# Patient Record
Sex: Male | Born: 1950
Health system: Southern US, Community
[De-identification: ages and names within clinical notes are randomized; demographics above are authoritative.]

## PROBLEM LIST (undated history)

## (undated) DIAGNOSIS — E119 Type 2 diabetes mellitus without complications: Secondary | ICD-10-CM

## (undated) DIAGNOSIS — E785 Hyperlipidemia, unspecified: Principal | ICD-10-CM

## (undated) DIAGNOSIS — I10 Essential (primary) hypertension: Secondary | ICD-10-CM

## (undated) HISTORY — DX: Hyperlipidemia, unspecified: E78.5

## (undated) HISTORY — DX: Essential (primary) hypertension: I10

## (undated) HISTORY — DX: Type 2 diabetes mellitus without complications: E11.9

## (undated) HISTORY — PX: TONSILLECTOMY: SUR1361

## (undated) HISTORY — PX: COLONOSCOPY: SHX174

---

## 1973-11-25 HISTORY — PX: INNER EAR SURGERY: SHX679

## 2004-06-01 ENCOUNTER — Emergency Department (HOSPITAL_COMMUNITY): Admission: EM | Admit: 2004-06-01 | Discharge: 2004-06-01 | Payer: Self-pay | Admitting: Emergency Medicine

## 2008-11-25 LAB — HM COLONOSCOPY: HM Colonoscopy: NORMAL

## 2010-07-17 ENCOUNTER — Ambulatory Visit: Payer: Self-pay | Admitting: Family Medicine

## 2010-07-17 DIAGNOSIS — I10 Essential (primary) hypertension: Secondary | ICD-10-CM

## 2010-07-17 HISTORY — DX: Essential (primary) hypertension: I10

## 2010-08-02 ENCOUNTER — Ambulatory Visit: Payer: Self-pay | Admitting: Family Medicine

## 2010-08-02 LAB — CONVERTED CEMR LAB
Alkaline Phosphatase: 46 units/L (ref 39–117)
BUN: 14 mg/dL (ref 6–23)
Basophils Absolute: 0 10*3/uL (ref 0.0–0.1)
Basophils Relative: 1 % (ref 0.0–3.0)
Bilirubin Urine: NEGATIVE
Bilirubin, Direct: 0.1 mg/dL (ref 0.0–0.3)
CO2: 29 meq/L (ref 19–32)
Calcium: 9.2 mg/dL (ref 8.4–10.5)
Cholesterol: 240 mg/dL — ABNORMAL HIGH (ref 0–200)
Creatinine, Ser: 1.2 mg/dL (ref 0.4–1.2)
Direct LDL: 182 mg/dL
Eosinophils Absolute: 0.1 10*3/uL (ref 0.0–0.7)
Ketones, urine, test strip: NEGATIVE
Lymphocytes Relative: 52.6 % — ABNORMAL HIGH (ref 12.0–46.0)
MCHC: 34.3 g/dL (ref 30.0–36.0)
Monocytes Absolute: 0.3 10*3/uL (ref 0.1–1.0)
Neutrophils Relative %: 36 % — ABNORMAL LOW (ref 43.0–77.0)
PSA: 0.97 ng/mL (ref 0.10–4.00)
Platelets: 211 10*3/uL (ref 150.0–400.0)
Protein, U semiquant: NEGATIVE
RBC: 4.39 M/uL (ref 3.87–5.11)
Total Bilirubin: 0.7 mg/dL (ref 0.3–1.2)
Total CHOL/HDL Ratio: 6
Total Protein: 6.8 g/dL (ref 6.0–8.3)
Triglycerides: 140 mg/dL (ref 0.0–149.0)
Urobilinogen, UA: 0.2
VLDL: 28 mg/dL (ref 0.0–40.0)
pH: 7

## 2010-08-07 ENCOUNTER — Ambulatory Visit: Payer: Self-pay | Admitting: Family Medicine

## 2010-10-01 ENCOUNTER — Encounter: Payer: Self-pay | Admitting: Family Medicine

## 2010-10-03 ENCOUNTER — Telehealth: Payer: Self-pay | Admitting: Family Medicine

## 2010-12-25 NOTE — Assessment & Plan Note (Signed)
Summary: cpx/njr   Vital Signs:  Patient profile:   60 year old male Height:      68.5 inches Weight:      205 pounds Temp:     98.2 degrees F oral Pulse rate:   72 / minute Pulse rhythm:   regular Resp:     12 per minute BP sitting:   142 / 90  (left arm) Cuff size:   large  Vitals Entered By: Sid Falcon LPN (August 07, 2010 10:20 AM) CC: CPX, Lipid Management   History of Present Illness: Here for CPE.  PMH, SH, AND FH are reviewed.  Pt had colonoscopy last year -reportedly normal. FH signif only for hypertension.  No premature CAD. Pt exercises with Tennis few times per week.    Diabetes Management History:      She says that she is not exercising regularly.    Lipid Management History:      Positive NCEP/ATP III risk factors include male age 59 years old or older and hypertension.  Negative NCEP/ATP III risk factors include non-diabetic, no family history for ischemic heart disease, non-tobacco-user status, no ASHD (atherosclerotic heart disease), no prior stroke/TIA, no peripheral vascular disease, and no history of aortic aneurysm.      Clinical Review Panels:  Prevention   Last Colonoscopy:  normal (11/25/2008)   Last PSA:  0.97 (08/02/2010)  Immunizations   Last Tetanus Booster:  Historical (11/26/2003)   Last Flu Vaccine:  Fluvax 3+ (08/07/2010)  Lipid Management   Cholesterol:  240 (08/02/2010)   HDL (good cholesterol):  41.40 (08/02/2010)  Diabetes Management   Creatinine:  1.2 (08/02/2010)   Last Flu Vaccine:  Fluvax 3+ (08/07/2010)  CBC   WBC:  3.8 (08/02/2010)   RBC:  4.39 (08/02/2010)   Hgb:  13.9 (08/02/2010)   Hct:  40.5 (08/02/2010)   Platelets:  211.0 (08/02/2010)   MCV  92.1 (08/02/2010)   MCHC  34.3 (08/02/2010)   RDW  13.4 (08/02/2010)   PMN:  36.0 (08/02/2010)   Lymphs:  52.6 (08/02/2010)   Monos:  8.1 (08/02/2010)   Eosinophils:  2.3 (08/02/2010)   Basophil:  1.0 (08/02/2010)  Complete Metabolic Panel   Glucose:   106 (08/02/2010)   Sodium:  136 (08/02/2010)   Potassium:  4.1 (08/02/2010)   Chloride:  99 (08/02/2010)   CO2:  29 (08/02/2010)   BUN:  14 (08/02/2010)   Creatinine:  1.2 (08/02/2010)   Albumin:  4.0 (08/02/2010)   Total Protein:  6.8 (08/02/2010)   Calcium:  9.2 (08/02/2010)   Total Bili:  0.7 (08/02/2010)   Alk Phos:  46 (08/02/2010)   SGPT (ALT):  53 (08/02/2010)   SGOT (AST):  25 (08/02/2010)   Allergies (verified): No Known Drug Allergies  Past History:  Past Medical History: Last updated: 07/17/2010 Hyperlipidemia Hypertension  Past Surgical History: Last updated: 07/17/2010 left ear surgery 1975  Family History: Last updated: 07/17/2010 Family History Hypertension  Social History: Last updated: 07/17/2010 Occupation:  Charity fundraiser Married Never Smoked Alcohol use-no Regular exercise-no  Risk Factors: Exercise: no (07/17/2010)  Risk Factors: Smoking Status: never (07/17/2010) PMH-FH-SH reviewed for relevance  Review of Systems  The patient denies anorexia, fever, weight loss, vision loss, decreased hearing, hoarseness, chest pain, syncope, dyspnea on exertion, peripheral edema, prolonged cough, headaches, hemoptysis, abdominal pain, melena, hematochezia, severe indigestion/heartburn, hematuria, incontinence, genital sores, muscle weakness, suspicious skin lesions, transient blindness, difficulty walking, depression, unusual weight change, abnormal bleeding, enlarged lymph nodes, and testicular masses.  Physical Exam  General:  Well-developed,well-nourished,in no acute distress; alert,appropriate and cooperative throughout examination Head:  Normocephalic and atraumatic without obvious abnormalities. No apparent alopecia or balding. Eyes:  No corneal or conjunctival inflammation noted. EOMI. Perrla. Funduscopic exam benign, without hemorrhages, exudates or papilledema. Vision grossly normal. Ears:  External ear exam shows no significant lesions or  deformities.  Otoscopic examination reveals clear canals, tympanic membranes are intact bilaterally without bulging, retraction, inflammation or discharge. Hearing is grossly normal bilaterally. Mouth:  Oral mucosa and oropharynx without lesions or exudates.  Teeth in good repair. Neck:  No deformities, masses, or tenderness noted. Lungs:  Normal respiratory effort, chest expands symmetrically. Lungs are clear to auscultation, no crackles or wheezes. Heart:  Normal rate and regular rhythm. S1 and S2 normal without gallop, murmur, click, rub or other extra sounds. Abdomen:  Bowel sounds positive,abdomen soft and non-tender without masses, organomegaly or hernias noted. Msk:  large lipoma upper back Extremities:  No clubbing, cyanosis, edema, or deformity noted with normal full range of motion of all joints.   Neurologic:  alert & oriented X3, cranial nerves II-XII intact, strength normal in all extremities, and gait normal.   Skin:  no rashes and no suspicious lesions.   Cervical Nodes:  No lymphadenopathy noted Psych:  normally interactive, good eye contact, not anxious appearing, and not depressed appearing.     Impression & Recommendations:  Problem # 1:  Preventive Health Care (ICD-V70.0) labs reviewed with pt. Flu vaccine given. REcommend work on weight loss.  Problem # 2:  HYPERTENSION (ICD-401.9) Marginal control.  Discussed tighter control.  He prefers to work on weight loss and reassess in few months. His updated medication list for this problem includes:    Benicar Hct 40-12.5 Mg Tabs (Olmesartan medoxomil-hctz) ..... Once daily  Problem # 3:  HYPERLIPIDEMIA (ICD-272.4) discussed options.  Educ given on lowering saturated fat.  Will plan to repeat in 6 months.  Complete Medication List: 1)  Benicar Hct 40-12.5 Mg Tabs (Olmesartan medoxomil-hctz) .... Once daily  Other Orders: Admin 1st Vaccine (04540) Flu Vaccine 84yrs + [phone])  Diabetes Management Assessment/Plan:       The following lipid goals have been established for the patient: Total cholesterol goal of 200; LDL cholesterol goal of 130; HDL cholesterol goal of 40; Triglyceride goal of 150.    Lipid Assessment/Plan:      Based on NCEP/ATP III, the patient's risk factor category is "0-1 risk factors".  The patient's lipid goals are as follows: Total cholesterol goal is 200; LDL cholesterol goal is 130; HDL cholesterol goal is 40; Triglyceride goal is 150.    Patient Instructions: 1)  It is important that you exercise reguarly at least 20 minutes 5 times a week. If you develop chest pain, have severe difficulty breathing, or feel very tired, stop exercising immediately and seek medical attention.  2)  You need to lose weight. Consider a lower calorie diet and regular exercise.  3)  Check your  Blood Pressure regularly . If it is above:140/90   you should make an appointment. 4)  Please schedule a follow-up appointment in 6 months .  5)  BMP prior to visit, ICD-9: 401.9 6)  Lipid panel prior to visit ICD-9 : 272.4  Flu Vaccine Consent Questions     Do you have a history of severe allergic reactions to this vaccine? no    Any prior history of allergic reactions to egg and/or gelatin? no    Do you have a  sensitivity to the preservative Thimersol? no    Do you have a past history of Guillan-Barre Syndrome? no    Do you currently have an acute febrile illness? no    Have you ever had a severe reaction to latex? no    Vaccine information given and explained to patient? yes    Are you currently pregnant? no    Lot Number:[id number]   Exp Date:05/25/2011   Site Given  Left Deltoid IM        .lbflu   Prevention & Chronic Care Immunizations   Influenza vaccine: Fluvax 3+  (08/07/2010)    Tetanus booster: 11/26/2003: Historical    Pneumococcal vaccine: Not documented  Colorectal Screening   Hemoccult: Not documented    Colonoscopy: normal  (11/25/2008)  Other Screening   PSA: 0.97   (08/02/2010)   Smoking status: never  (07/17/2010)  Lipids   Total Cholesterol: 240  (08/02/2010)   LDL: Not documented   LDL Direct: 182.0  (08/02/2010)   HDL: 41.40  (08/02/2010)   Triglycerides: 140.0  (08/02/2010)    SGOT (AST): 25  (08/02/2010)   SGPT (ALT): 53  (08/02/2010)   Alkaline phosphatase: 46  (08/02/2010)   Total bilirubin: 0.7  (08/02/2010)  Hypertension   Last Blood Pressure: 142 / 90  (08/07/2010)   Serum creatinine: 1.2  (08/02/2010)   Serum potassium 4.1  (08/02/2010)  Self-Management Support :    Hypertension self-management support: Not documented    Lipid self-management support: Not documented

## 2010-12-25 NOTE — Progress Notes (Signed)
  Phone Note Call from Patient   Summary of Call: Home BP readings submitted and improving with weight loss and diet change.  Mostly 130s systolic and 70s diastolic. He is requesting Cialis for ED.  No nitroglycerin use.  will send in script. Initial call taken by: Evelena Peat MD,  October 03, 2010 9:06 AM    New/Updated Medications: CIALIS 20 MG TABS (TADALAFIL) one by mouth every other day as needed Prescriptions: CIALIS 20 MG TABS (TADALAFIL) one by mouth every other day as needed  #6 x 6   Entered and Authorized by:   Evelena Peat MD   Signed by:   Evelena Peat MD on 10/03/2010   Method used:   Electronically to        [facility] [address] (retail)       [phone]       [place], [place]  [address]       Ph: [phone]       Fax: [phone]   RxID:   [id number]

## 2010-12-25 NOTE — Assessment & Plan Note (Signed)
Summary: to be est/pt will come in fasting/njr   Vital Signs:  Patient profile:   60 year old male Height:      68 inches Weight:      203 pounds BMI:     30.98 Temp:     98.0 degrees F oral Pulse rate:   72 / minute Pulse rhythm:   regular Resp:     12 per minute BP sitting:   140 / 100  (left arm) Cuff size:   regular  Vitals Entered By: Sid Falcon LPN (July 17, 2010 9:13 AM)  Nutrition Counseling: Patient's BMI is greater than 25 and therefore counseled on weight management options.  Serial Vital Signs/Assessments:  Time      Position  BP       Pulse  Resp  Temp     By                     148/98                         Evelena Peat MD  CC: CPX, pt fasting, Hypertension Management, Lipid Management   History of Present Illness: New patient to establish care.  Patient has hypertension treated with Benicar HCTZ. Also reported history of hyperlipidemia but not on treatment. No history of CAD. Blood pressures have generally been fairly well controlled until recently with some systolics from 140 and diastolics around 90-100. No headaches or dizziness. Denies any palpitations or chest pains. Exercises with tennis about 2-3 times per week.  Family history significant for mother with hypertension otherwise unrevealing.  Social history he is married has one son who just started college. Nonsmoker. No alcohol use. Works as a Charity fundraiser with [employer]  Hypertension History:      She denies headache, chest pain, palpitations, dyspnea with exertion, orthopnea, peripheral edema, visual symptoms, syncope, and side effects from treatment.  She notes no problems with any antihypertensive medication side effects.        Positive major cardiovascular risk factors include male age 71 years old or older, hyperlipidemia, and hypertension.  Negative major cardiovascular risk factors include no history of diabetes and non-tobacco-user status.        Further assessment for target  organ damage reveals no history of ASHD, stroke/TIA, or peripheral vascular disease.    Lipid Management History:      Positive NCEP/ATP III risk factors include male age 15 years old or older and hypertension.  Negative NCEP/ATP III risk factors include non-diabetic, non-tobacco-user status, no ASHD (atherosclerotic heart disease), no prior stroke/TIA, no peripheral vascular disease, and no history of aortic aneurysm.      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  The patient denies fever, weight loss, decreased hearing, hoarseness, chest pain, syncope, dyspnea on exertion, peripheral edema, prolonged cough, headaches, hemoptysis, abdominal pain, melena, hematochezia, and severe indigestion/heartburn.    Physical Exam  General:  Well-developed,well-nourished,in no acute distress; alert,appropriate and cooperative throughout examination Eyes:  No corneal or conjunctival inflammation noted. EOMI. Perrla. Funduscopic exam benign, without hemorrhages, exudates or papilledema. Vision grossly normal. Ears:  External ear  exam shows no significant lesions or deformities.  Otoscopic examination reveals clear canals, tympanic membranes are intact bilaterally without bulging, retraction, inflammation or discharge. Hearing is grossly normal bilaterally. Mouth:  Oral mucosa and oropharynx without lesions or exudates.  Teeth in good repair. Neck:  No deformities, masses, or tenderness noted. Lungs:  Normal respiratory effort, chest expands symmetrically. Lungs are clear to auscultation, no crackles or wheezes. Heart:  normal rate and regular rhythm.     Impression & Recommendations:  Problem # 1:  HYPERTENSION (ICD-401.9) Assessment Deteriorated discussed options. Weight loss and schedule complete physical and reassess 2 months. If still up at that time consider addition of another medication Her updated medication list for this problem includes:    Benicar Hct 40-12.5 Mg Tabs (Olmesartan medoxomil-hctz) ..... Once daily  Problem # 2:  HYPERLIPIDEMIA (ICD-272.4)  Complete Medication List: 1)  Benicar Hct 40-12.5 Mg Tabs (Olmesartan medoxomil-hctz) .... Once daily  Hypertension Assessment/Plan:      The patient's hypertensive risk group is category B: At least one risk factor (excluding diabetes) with no target organ damage.  Today's blood pressure is 140/100.    Lipid Assessment/Plan:      Based on NCEP/ATP III, the patient's risk factor category is "2 or more risk factors and a calculated 10 year CAD risk of > 20%".  The patient's lipid goals are as follows: Total cholesterol goal is 200; LDL cholesterol goal is 130; HDL cholesterol goal is 40; Triglyceride goal is 150.    Patient Instructions: 1)  Schedule complete physical examination  2)  Limit your Sodium(salt) .  3)  It is important that you exercise reguarly at least 20 minutes 5 times a week. If you develop chest pain, have severe difficulty breathing, or feel very tired, stop exercising immediately and seek medical attention.  4)  You need to lose  weight. Consider a lower calorie diet and regular exercise.   Preventive Care Screening  Colonoscopy:    Date:  11/25/2008    Results:  normal   Last Tetanus Booster:    Date:  11/26/2003    Results:  Historical

## 2010-12-25 NOTE — Progress Notes (Signed)
Summary: List of Blood Pressures taken at home  List of Blood Pressures taken at home   Imported By: Maryln Gottron 10/08/2010 13:40:37  _____________________________________________________________________  External Attachment:    Type:   Image     Comment:   External Document

## 2011-01-10 ENCOUNTER — Telehealth: Payer: Self-pay | Admitting: Family Medicine

## 2011-01-14 NOTE — Telephone Encounter (Signed)
error 

## 2011-02-06 ENCOUNTER — Ambulatory Visit: Payer: Self-pay | Admitting: Family Medicine

## 2011-03-19 ENCOUNTER — Ambulatory Visit: Payer: Self-pay | Admitting: Family Medicine

## 2011-03-26 ENCOUNTER — Encounter: Payer: Self-pay | Admitting: Family Medicine

## 2011-03-26 ENCOUNTER — Ambulatory Visit (INDEPENDENT_AMBULATORY_CARE_PROVIDER_SITE_OTHER): Payer: Self-pay | Admitting: Family Medicine

## 2011-03-26 VITALS — BP 140/82 | Temp 98.5°F | Ht 69.0 in | Wt 200.0 lb

## 2011-03-26 DIAGNOSIS — R7303 Prediabetes: Secondary | ICD-10-CM

## 2011-03-26 DIAGNOSIS — E785 Hyperlipidemia, unspecified: Secondary | ICD-10-CM

## 2011-03-26 DIAGNOSIS — R7309 Other abnormal glucose: Secondary | ICD-10-CM

## 2011-03-26 DIAGNOSIS — I10 Essential (primary) hypertension: Secondary | ICD-10-CM

## 2011-03-26 HISTORY — DX: Hyperlipidemia, unspecified: E78.5

## 2011-03-26 LAB — LIPID PANEL
Cholesterol: 238 mg/dL — ABNORMAL HIGH (ref 0–200)
HDL: 45.1 mg/dL (ref >39.00)
Triglycerides: 115 mg/dL (ref 0.0–149.0)

## 2011-03-26 NOTE — Progress Notes (Signed)
  Subjective:    Patient ID: Jon Moses, male    DOB: 04/06/1951, 60 y.o.   MRN: [id number]  HPI Patient seen for followup. Elevated blood pressure and increased lipids at physical. Has made some lifestyle changes. Reduction of sodium intake. Reduction of overall fats and sugars. Lost about 3 pounds. Has not been on exercise secondary to finger injury. Plays tennis for exercise. Compliant with medication except for last night-didn't take BP med.. Blood pressures well controlled by home readings and improved over the past few months. He had history of prediabetes with blood sugar 106 on recent physical. No symptoms of hyperglycemia.   Review of Systems  Constitutional: Negative for fatigue.  Eyes: Negative for visual disturbance.  Respiratory: Negative for cough, chest tightness and shortness of breath.   Cardiovascular: Negative for chest pain, palpitations and leg swelling.  Genitourinary: Negative for dysuria.  Neurological: Negative for dizziness, syncope, weakness, light-headedness and headaches.  Hematological: Negative for adenopathy.       Objective:   Physical Exam  Constitutional: He is oriented to person, place, and time. He appears well-developed and well-nourished.  HENT:  Mouth/Throat: Oropharynx is clear and moist. No oropharyngeal exudate.  Eyes: Pupils are equal, round, and reactive to light.  Neck: Neck supple. No thyromegaly present.  Cardiovascular: Normal rate, regular rhythm and normal heart sounds.   No murmur heard. Pulmonary/Chest: Effort normal and breath sounds normal. No respiratory distress. He has no wheezes. He has no rales.  Musculoskeletal: He exhibits no edema.  Neurological: He is alert and oriented to person, place, and time.  Psychiatric: He has a normal mood and affect.          Assessment & Plan:  #1 hypertension improved continue weight loss efforts. Get back to regular exercise. Continue current medication #2 hyperlipidemia recheck  fasting lipids today.  #3 prediabetes. Recheck fasting blood sugar this morning. Continue weight loss efforts.

## 2011-03-28 NOTE — Progress Notes (Signed)
Quick Note:  Pt informed ______ 

## 2011-04-02 NOTE — Progress Notes (Signed)
Quick Note:  Pt informed ______ 

## 2011-05-31 ENCOUNTER — Other Ambulatory Visit: Payer: Self-pay | Admitting: *Deleted

## 2011-05-31 MED ORDER — TADALAFIL 20 MG PO TABS: 20.0000 mg | ORAL_TABLET | Freq: Every day | ORAL | Status: DC | PRN

## 2011-05-31 NOTE — Telephone Encounter (Signed)
PC, pt requesting refill Cialis

## 2011-09-26 ENCOUNTER — Ambulatory Visit: Payer: Self-pay | Admitting: Family Medicine

## 2011-10-29 ENCOUNTER — Ambulatory Visit (INDEPENDENT_AMBULATORY_CARE_PROVIDER_SITE_OTHER): Payer: BC Managed Care – PPO | Admitting: Family Medicine

## 2011-10-29 ENCOUNTER — Encounter: Payer: Self-pay | Admitting: Family Medicine

## 2011-10-29 VITALS — BP 156/98 | Temp 98.5°F | Wt 196.0 lb

## 2011-10-29 DIAGNOSIS — E785 Hyperlipidemia, unspecified: Secondary | ICD-10-CM

## 2011-10-29 DIAGNOSIS — Z23 Encounter for immunization: Secondary | ICD-10-CM

## 2011-10-29 DIAGNOSIS — I10 Essential (primary) hypertension: Secondary | ICD-10-CM

## 2011-10-29 LAB — LDL CHOLESTEROL, DIRECT: Direct LDL: 168.2 mg/dL

## 2011-10-29 LAB — HEPATIC FUNCTION PANEL
ALT: 26 U/L (ref 0–53)
Bilirubin, Direct: 0 mg/dL (ref 0.0–0.3)
Total Bilirubin: 0.8 mg/dL (ref 0.3–1.2)
Total Protein: 7.5 g/dL (ref 6.0–8.3)

## 2011-10-29 LAB — BASIC METABOLIC PANEL
BUN: 15 mg/dL (ref 6–23)
Chloride: 99 mEq/L (ref 96–112)
GFR: 90.98 mL/min (ref >60.00)
Potassium: 3.8 mEq/L (ref 3.5–5.1)
Sodium: 137 mEq/L (ref 135–145)

## 2011-10-29 LAB — LIPID PANEL
HDL: 52.3 mg/dL (ref >39.00)
Triglycerides: 88 mg/dL (ref 0.0–149.0)
VLDL: 17.6 mg/dL (ref 0.0–40.0)

## 2011-10-29 NOTE — Progress Notes (Signed)
  Subjective:    Patient ID: Jon Moses, male    DOB: 1951/02/09, 60 y.o.   MRN: [id number]  HPI  Medical followup. Hypertension treated with Benicar HCTZ. Compliant with therapy. Not monitoring blood pressures regularly. No headaches or dizziness. Has history of hyperlipidemia and has been trying to lose some weight. 4 additional pounds of weight loss since last visit. He made substantial dietary changes. Prefers to avoid antilipid medications. No history of smoking. No alcohol use. No consistent aerobic exercise.   Review of Systems  Constitutional: Negative for fatigue.  Eyes: Negative for visual disturbance.  Respiratory: Negative for cough, chest tightness and shortness of breath.   Cardiovascular: Negative for chest pain, palpitations and leg swelling.  Neurological: Negative for dizziness, syncope, weakness, light-headedness and headaches.       Objective:   Physical Exam  Constitutional: He is oriented to person, place, and time. He appears well-developed and well-nourished.  Neck: Neck supple. No thyromegaly present.  Cardiovascular: Normal rate, regular rhythm and normal heart sounds.   No murmur heard. Pulmonary/Chest: Effort normal and breath sounds normal. No respiratory distress. He has no wheezes. He has no rales.  Musculoskeletal: He exhibits no edema.  Neurological: He is alert and oriented to person, place, and time.          Assessment & Plan:  #1 hypertension. Poor control by today's reading. Reassess in one month. Bring home monitor and home readings at that time. If still up then additional medication  #2 hyperlipidemia. Check lipid and hepatic panel. Continue weight loss efforts.  #3 health maintenance. Flu vaccine recommended and patient has no contraindications.

## 2011-10-29 NOTE — Patient Instructions (Addendum)
Increase aerobic exercise. Continue with weight loss efforts. Bring home BP monitor at follow up. Goal BP is < 140/90.

## 2011-10-30 NOTE — Progress Notes (Signed)
Quick Note:  Pt informed on cell VM ______ 

## 2011-11-29 ENCOUNTER — Ambulatory Visit (INDEPENDENT_AMBULATORY_CARE_PROVIDER_SITE_OTHER): Payer: BC Managed Care – PPO | Admitting: Family Medicine

## 2011-11-29 ENCOUNTER — Encounter: Payer: Self-pay | Admitting: Family Medicine

## 2011-11-29 VITALS — BP 160/100 | Temp 98.5°F | Wt 196.0 lb

## 2011-11-29 DIAGNOSIS — I10 Essential (primary) hypertension: Secondary | ICD-10-CM

## 2011-11-29 MED ORDER — AMLODIPINE BESYLATE 5 MG PO TABS: 5.0000 mg | ORAL_TABLET | Freq: Every day | ORAL | Status: DC

## 2011-11-29 NOTE — Progress Notes (Signed)
  Subjective:    Patient ID: Jon Moses, male    DOB: [birth year], 61 y.o.   MRN: [id number]  HPI  Patient seen for followup hypertension. By home readings he's had some as high as 160 systolic and lows from 136 systolic. No headaches or dizziness. Takes Benicar HCT 40/12.5 one daily.   No consistent exercise.  Compliant with BP medication.  No chest pain or dyspnea.   Review of Systems  Constitutional: Negative for fever, appetite change, fatigue and unexpected weight change.  Respiratory: Negative for cough and shortness of breath.   Cardiovascular: Negative for chest pain.  Neurological: Negative for dizziness and headaches.       Objective:   Physical Exam  Constitutional: He appears well-developed and well-nourished. No distress.  Eyes: Pupils are equal, round, and reactive to light.  Cardiovascular: Normal rate and regular rhythm.   Pulmonary/Chest: Effort normal and breath sounds normal. No respiratory distress. He has no wheezes. He has no rales.  Musculoskeletal: He exhibits no edema.          Assessment & Plan:  Hypertension suboptimally controlled. Add amlodipine 5 mg daily and continue Benicar HCTZ. Get back established with regular exercise. Routine followup 2 months

## 2012-01-27 ENCOUNTER — Ambulatory Visit: Payer: BC Managed Care – PPO | Admitting: Family Medicine

## 2012-01-27 ENCOUNTER — Telehealth: Payer: Self-pay | Admitting: *Deleted

## 2012-01-27 NOTE — Telephone Encounter (Signed)
Pt was no show for 2 month F/U visit.  VM left for pt to call with explaination

## 2012-02-19 ENCOUNTER — Ambulatory Visit: Payer: BC Managed Care – PPO | Admitting: Family Medicine

## 2012-04-03 ENCOUNTER — Ambulatory Visit: Payer: BC Managed Care – PPO | Admitting: Family Medicine

## 2012-04-03 DIAGNOSIS — Z0289 Encounter for other administrative examinations: Secondary | ICD-10-CM

## 2012-07-15 ENCOUNTER — Other Ambulatory Visit: Payer: Self-pay

## 2012-07-15 NOTE — Telephone Encounter (Signed)
Refill for 6 months. 

## 2012-07-15 NOTE — Telephone Encounter (Signed)
Rx request for cialis 20 mg #10.  Pt last seen /04/13.  Rx last filled 05/31/11  Pls advise.

## 2012-07-16 MED ORDER — TADALAFIL 20 MG PO TABS: 20.0000 mg | ORAL_TABLET | Freq: Every day | ORAL | Status: DC | PRN

## 2012-07-16 NOTE — Telephone Encounter (Signed)
Rx sent to pharmacy   

## 2012-09-09 ENCOUNTER — Other Ambulatory Visit (INDEPENDENT_AMBULATORY_CARE_PROVIDER_SITE_OTHER): Payer: BC Managed Care – PPO

## 2012-09-09 DIAGNOSIS — Z0000 Encounter for general adult medical examination without abnormal findings: Secondary | ICD-10-CM

## 2012-09-09 LAB — BASIC METABOLIC PANEL
CO2: 32 mEq/L (ref 19–32)
Chloride: 98 mEq/L (ref 96–112)
Creatinine, Ser: 1.2 mg/dL (ref 0.4–1.5)
Potassium: 4.6 mEq/L (ref 3.5–5.1)

## 2012-09-09 LAB — HEPATIC FUNCTION PANEL
ALT: 35 U/L (ref 0–53)
Albumin: 3.9 g/dL (ref 3.5–5.2)
Bilirubin, Direct: 0.1 mg/dL (ref 0.0–0.3)
Total Protein: 7.1 g/dL (ref 6.0–8.3)

## 2012-09-09 LAB — CBC WITH DIFFERENTIAL/PLATELET
Basophils Relative: 1.1 % (ref 0.0–3.0)
Eosinophils Absolute: 0.1 10*3/uL (ref 0.0–0.7)
Eosinophils Relative: 2.3 % (ref 0.0–5.0)
HCT: 43.8 % (ref 39.0–52.0)
Hemoglobin: 14.8 g/dL (ref 13.0–17.0)
Lymphs Abs: 1.8 10*3/uL (ref 0.7–4.0)
MCHC: 33.8 g/dL (ref 30.0–36.0)
MCV: 91.9 fl (ref 78.0–100.0)
Monocytes Absolute: 0.3 10*3/uL (ref 0.1–1.0)
Neutro Abs: 1.4 10*3/uL (ref 1.4–7.7)
Neutrophils Relative %: 37.6 % — ABNORMAL LOW (ref 43.0–77.0)
RBC: 4.77 Mil/uL (ref 4.22–5.81)
WBC: 3.7 10*3/uL — ABNORMAL LOW (ref 4.5–10.5)

## 2012-09-09 LAB — POCT URINALYSIS DIPSTICK
Bilirubin, UA: NEGATIVE
Blood, UA: NEGATIVE
Leukocytes, UA: NEGATIVE
Nitrite, UA: NEGATIVE
Protein, UA: NEGATIVE
pH, UA: 6.5

## 2012-09-09 LAB — LIPID PANEL
Cholesterol: 223 mg/dL — ABNORMAL HIGH (ref 0–200)
HDL: 43 mg/dL (ref >39.00)
VLDL: 20 mg/dL (ref 0.0–40.0)

## 2012-09-09 LAB — PSA: PSA: 0.99 ng/mL (ref 0.10–4.00)

## 2012-09-14 ENCOUNTER — Encounter: Payer: BC Managed Care – PPO | Admitting: Family Medicine

## 2012-09-16 ENCOUNTER — Encounter: Payer: Self-pay | Admitting: Family Medicine

## 2012-09-16 ENCOUNTER — Ambulatory Visit (INDEPENDENT_AMBULATORY_CARE_PROVIDER_SITE_OTHER): Payer: BC Managed Care – PPO | Admitting: Family Medicine

## 2012-09-16 VITALS — BP 142/78 | Temp 97.6°F | Ht 68.5 in | Wt 191.0 lb

## 2012-09-16 DIAGNOSIS — Z23 Encounter for immunization: Secondary | ICD-10-CM

## 2012-09-16 DIAGNOSIS — Z0000 Encounter for general adult medical examination without abnormal findings: Secondary | ICD-10-CM

## 2012-09-16 NOTE — Progress Notes (Signed)
  Subjective:    Patient ID: Jon Moses, male    DOB: 30-Oct-1951, 61 y.o.   MRN: [id number]  HPI  Patient here for complete physical.  Patient has history of hypertension treated with 2 drug regimen. Blood pressures have been stable by home readings. Tetanus up-to-date. Colonoscopy 2010. Patient is exercising fairly regularly. Feels great overall. No specific complaints. Needs flu vaccine.  Past Medical History  Diagnosis Date  . HYPERTENSION 07/17/2010  . Hyperlipidemia 03/26/2011   Past Surgical History  Procedure Date  . Inner ear surgery 1975    left    reports that he has never smoked. He does not have any smokeless tobacco history on file. His alcohol and drug histories not on file. family history includes Hypertension in his other. No Known Allergies    Review of Systems  Constitutional: Negative for fever, activity change, appetite change, fatigue and unexpected weight change.  HENT: Negative for ear pain, congestion and trouble swallowing.   Eyes: Negative for pain and visual disturbance.  Respiratory: Negative for cough, shortness of breath and wheezing.   Cardiovascular: Negative for chest pain and palpitations.  Gastrointestinal: Negative for nausea, vomiting, abdominal pain, diarrhea, constipation, blood in stool, abdominal distention and rectal pain.  Genitourinary: Negative for dysuria, hematuria and testicular pain.  Musculoskeletal: Negative for joint swelling and arthralgias.  Skin: Negative for rash.  Neurological: Negative for dizziness, syncope and headaches.  Hematological: Negative for adenopathy.  Psychiatric/Behavioral: Negative for confusion and dysphoric mood.       Objective:   Physical Exam  Constitutional: He is oriented to person, place, and time. He appears well-developed and well-nourished. No distress.  HENT:  Head: Normocephalic and atraumatic.  Right Ear: External ear normal.  Left Ear: External ear normal.  Mouth/Throat: Oropharynx  is clear and moist.  Eyes: Conjunctivae normal and EOM are normal. Pupils are equal, round, and reactive to light.  Neck: Normal range of motion. Neck supple. No thyromegaly present.  Cardiovascular: Normal rate, regular rhythm and normal heart sounds.   No murmur heard. Pulmonary/Chest: No respiratory distress. He has no wheezes. He has no rales.  Abdominal: Soft. Bowel sounds are normal. He exhibits no distension and no mass. There is no tenderness. There is no rebound and no guarding.  Musculoskeletal: He exhibits no edema.  Lymphadenopathy:    He has no cervical adenopathy.  Neurological: He is alert and oriented to person, place, and time. He displays normal reflexes. No cranial nerve deficit.  Skin: No rash noted.  Psychiatric: He has a normal mood and affect.          Assessment & Plan:  Complete physical. Flu vaccine given. Labs reviewed with patient. He has mild to moderately elevated lipids but overall fairly low risk for CAD. Is not interested in statin therapy this time. Continue to monitor blood pressures closely. Continue weight control efforts. Colonoscopy up to date.

## 2012-09-16 NOTE — Patient Instructions (Addendum)
Monitor blood pressure and be in touch if BP consistently greater than 140/90.

## 2013-08-12 ENCOUNTER — Telehealth: Payer: Self-pay | Admitting: Family Medicine

## 2013-08-12 NOTE — Telephone Encounter (Signed)
I do not remove lipomas.  If these are to be removed refer to general surgeon.  We do not recommend removal unless pain, rapid growth, etc.

## 2013-08-12 NOTE — Telephone Encounter (Signed)
Pt going to set up appointment

## 2013-08-12 NOTE — Telephone Encounter (Signed)
Pt has lipoma that was dx by dr Caryl Never. Pt would like to know if he should schedule removal of this w/ you. Or referral to someone else. pls advise.

## 2013-08-12 NOTE — Telephone Encounter (Signed)
Pt was last seen 09/16/2012

## 2013-09-07 ENCOUNTER — Other Ambulatory Visit: Payer: BC Managed Care – PPO

## 2013-09-16 ENCOUNTER — Encounter: Payer: BC Managed Care – PPO | Admitting: Family Medicine

## 2013-10-11 ENCOUNTER — Other Ambulatory Visit (INDEPENDENT_AMBULATORY_CARE_PROVIDER_SITE_OTHER): Payer: BC Managed Care – PPO

## 2013-10-11 DIAGNOSIS — Z0000 Encounter for general adult medical examination without abnormal findings: Secondary | ICD-10-CM

## 2013-10-11 DIAGNOSIS — E785 Hyperlipidemia, unspecified: Secondary | ICD-10-CM

## 2013-10-11 LAB — POCT URINALYSIS DIPSTICK
Glucose, UA: NEGATIVE
Nitrite, UA: NEGATIVE
Spec Grav, UA: 1.02
Urobilinogen, UA: 0.2

## 2013-10-11 LAB — CBC WITH DIFFERENTIAL/PLATELET
Basophils Absolute: 0 K/uL (ref 0.0–0.1)
Basophils Relative: 1 % (ref 0.0–3.0)
Eosinophils Absolute: 0.1 K/uL (ref 0.0–0.7)
Eosinophils Relative: 2.4 % (ref 0.0–5.0)
HCT: 41.5 % (ref 39.0–52.0)
Hemoglobin: 14.2 g/dL (ref 13.0–17.0)
Lymphocytes Relative: 44 % (ref 12.0–46.0)
Lymphs Abs: 1.6 K/uL (ref 0.7–4.0)
MCHC: 34.2 g/dL (ref 30.0–36.0)
MCV: 89.8 fl (ref 78.0–100.0)
Monocytes Absolute: 0.3 K/uL (ref 0.1–1.0)
Monocytes Relative: 8 % (ref 3.0–12.0)
Neutro Abs: 1.6 K/uL (ref 1.4–7.7)
Neutrophils Relative %: 44.6 % (ref 43.0–77.0)
Platelets: 219 K/uL (ref 150.0–400.0)
RBC: 4.62 Mil/uL (ref 4.22–5.81)
RDW: 13.3 % (ref 11.5–14.6)
WBC: 3.7 K/uL — ABNORMAL LOW (ref 4.5–10.5)

## 2013-10-11 LAB — LIPID PANEL
Cholesterol: 235 mg/dL — ABNORMAL HIGH (ref 0–200)
HDL: 48.7 mg/dL
Total CHOL/HDL Ratio: 5
Triglycerides: 106 mg/dL (ref 0.0–149.0)
VLDL: 21.2 mg/dL (ref 0.0–40.0)

## 2013-10-11 LAB — HEPATIC FUNCTION PANEL
AST: 17 U/L (ref 0–37)
Albumin: 4.2 g/dL (ref 3.5–5.2)
Total Bilirubin: 0.8 mg/dL (ref 0.3–1.2)

## 2013-10-11 LAB — TSH: TSH: 0.94 u[IU]/mL (ref 0.35–5.50)

## 2013-10-11 LAB — BASIC METABOLIC PANEL
BUN: 15 mg/dL (ref 6–23)
CO2: 30 mEq/L (ref 19–32)
GFR: 83.16 mL/min (ref >60.00)
Glucose, Bld: 108 mg/dL — ABNORMAL HIGH (ref 70–99)
Potassium: 4.4 mEq/L (ref 3.5–5.1)
Sodium: 136 mEq/L (ref 135–145)

## 2013-10-11 LAB — PSA: PSA: 1.16 ng/mL (ref 0.10–4.00)

## 2013-10-11 LAB — LDL CHOLESTEROL, DIRECT: Direct LDL: 175.2 mg/dL

## 2013-10-14 ENCOUNTER — Encounter: Payer: Self-pay | Admitting: Family Medicine

## 2013-10-14 ENCOUNTER — Ambulatory Visit (INDEPENDENT_AMBULATORY_CARE_PROVIDER_SITE_OTHER): Payer: BC Managed Care – PPO | Admitting: Family Medicine

## 2013-10-14 VITALS — BP 150/80 | HR 68 | Temp 97.8°F | Ht 68.0 in | Wt 196.0 lb

## 2013-10-14 DIAGNOSIS — D1779 Benign lipomatous neoplasm of other sites: Secondary | ICD-10-CM

## 2013-10-14 DIAGNOSIS — E785 Hyperlipidemia, unspecified: Secondary | ICD-10-CM

## 2013-10-14 DIAGNOSIS — Z0000 Encounter for general adult medical examination without abnormal findings: Secondary | ICD-10-CM

## 2013-10-14 DIAGNOSIS — D171 Benign lipomatous neoplasm of skin and subcutaneous tissue of trunk: Secondary | ICD-10-CM

## 2013-10-14 DIAGNOSIS — Z23 Encounter for immunization: Secondary | ICD-10-CM

## 2013-10-14 MED ORDER — TADALAFIL 20 MG PO TABS: ORAL_TABLET | ORAL | Status: DC

## 2013-10-14 NOTE — Progress Notes (Signed)
Subjective:    Patient ID: Jon Moses, male    DOB: 1951-01-02, 62 y.o.   MRN: [id number]  HPI Patient seen for complete physical He has history of hypertension and hyperlipidemia. Hypertension treated with Benicar HCTZ. Blood pressures been very well controlled by home readings. No headaches. No dizziness. No chest pains. Last tetanus 2005. Colonoscopy 2010. Needs flu vaccine. Unsure of coverage for shingles vaccine.  Patient has a large lipoma left upper back. Recently has had some associated pain off and on. No rapid growth. He would like to consider getting this excised.  Patient is nonsmoker. No family history of premature CAD. Father had a stroke and mother had hypertension history.  Past Medical History  Diagnosis Date  . HYPERTENSION 07/17/2010  . Hyperlipidemia 03/26/2011   Past Surgical History  Procedure Laterality Date  . Inner ear surgery  1975    left    reports that he has never smoked. He does not have any smokeless tobacco history on file. His alcohol and drug histories are not on file. family history includes Hypertension in his mother and other; Stroke in his father. No Known Allergies    Review of Systems  Constitutional: Negative for fever, activity change, appetite change, fatigue and unexpected weight change.  HENT: Negative for congestion, ear pain and trouble swallowing.   Eyes: Negative for pain and visual disturbance.  Respiratory: Negative for cough, shortness of breath and wheezing.   Cardiovascular: Negative for chest pain and palpitations.  Gastrointestinal: Negative for nausea, vomiting, abdominal pain, diarrhea, constipation, blood in stool, abdominal distention and rectal pain.  Endocrine: Negative for polydipsia and polyuria.  Genitourinary: Negative for dysuria, hematuria and testicular pain.  Musculoskeletal: Negative for arthralgias and joint swelling.  Skin: Negative for rash.  Neurological: Negative for dizziness, syncope and headaches.   Hematological: Negative for adenopathy.  Psychiatric/Behavioral: Negative for confusion and dysphoric mood.       Objective:   Physical Exam  Constitutional: He is oriented to person, place, and time. He appears well-developed and well-nourished. No distress.  HENT:  Head: Normocephalic and atraumatic.  Right Ear: External ear normal.  Left Ear: External ear normal.  Mouth/Throat: Oropharynx is clear and moist.  Eyes: Conjunctivae and EOM are normal. Pupils are equal, round, and reactive to light.  Neck: Normal range of motion. Neck supple. No thyromegaly present.  Cardiovascular: Normal rate, regular rhythm and normal heart sounds.   No murmur heard. Pulmonary/Chest: No respiratory distress. He has no wheezes. He has no rales.  Abdominal: Soft. Bowel sounds are normal. He exhibits no distension and no mass. There is no tenderness. There is no rebound and no guarding.  Genitourinary:  Prostate is mildly enlarged but symmetric with no nodules. No rectal masses.  Musculoskeletal: He exhibits no edema.  Patient has fairly large lipomatous mass left upper back. Approximately 5 cm diameter. Nontender to palpation  Lymphadenopathy:    He has no cervical adenopathy.  Neurological: He is alert and oriented to person, place, and time. He displays normal reflexes. No cranial nerve deficit.  Skin: No rash noted.  Psychiatric: He has a normal mood and affect.          Assessment & Plan:  Complete physical. Continue close monitoring of home blood pressure. Check on insurance coverage for shingles vaccine. Flu vaccine given. We discussed lipids at some length. He is not interested in statin medication this time, although his ATP risk calculator  ten-year risk of a cardiac event is 23%. We  recommended consideration for statin therapy. He prefers dietary modification and recheck in 6 months  Lipoma left upper back. Referral to general surgery for further evaluation

## 2013-10-14 NOTE — Patient Instructions (Addendum)
Fat and Cholesterol Control Diet Fat and cholesterol levels in your blood and organs are influenced by your diet. High levels of fat and cholesterol may lead to diseases of the heart, small and large blood vessels, gallbladder, liver, and pancreas. CONTROLLING FAT AND CHOLESTEROL WITH DIET Although exercise and lifestyle factors are important, your diet is key. That is because certain foods are known to raise cholesterol and others to lower it. The goal is to balance foods for their effect on cholesterol and more importantly, to replace saturated and trans fat with other types of fat, such as monounsaturated fat, polyunsaturated fat, and omega-3 fatty acids. On average, a person should consume no more than 15 to 17 g of saturated fat daily. Saturated and trans fats are considered "bad" fats, and they will raise LDL cholesterol. Saturated fats are primarily found in animal products such as meats, butter, and cream. However, that does not mean you need to give up all your favorite foods. Today, there are good tasting, low-fat, low-cholesterol substitutes for most of the things you like to eat. Choose low-fat or nonfat alternatives. Choose round or loin cuts of red meat. These types of cuts are lowest in fat and cholesterol. Chicken (without the skin), fish, veal, and ground turkey breast are great choices. Eliminate fatty meats, such as hot dogs and salami. Even shellfish have little or no saturated fat. Have a 3 oz (85 g) portion when you eat lean meat, poultry, or fish. Trans fats are also called "partially hydrogenated oils." They are oils that have been scientifically manipulated so that they are solid at room temperature resulting in a longer shelf life and improved taste and texture of foods in which they are added. Trans fats are found in stick margarine, some tub margarines, cookies, crackers, and baked goods.  When baking and cooking, oils are a great substitute for butter. The monounsaturated oils are  especially beneficial since it is believed they lower LDL and raise HDL. The oils you should avoid entirely are saturated tropical oils, such as coconut and palm.  Remember to eat a lot from food groups that are naturally free of saturated and trans fat, including fish, fruit, vegetables, beans, grains (barley, rice, couscous, bulgur wheat), and pasta (without cream sauces).  IDENTIFYING FOODS THAT LOWER FAT AND CHOLESTEROL  Soluble fiber may lower your cholesterol. This type of fiber is found in fruits such as apples, vegetables such as broccoli, potatoes, and carrots, legumes such as beans, peas, and lentils, and grains such as barley. Foods fortified with plant sterols (phytosterol) may also lower cholesterol. You should eat at least 2 g per day of these foods for a cholesterol lowering effect.  Read package labels to identify low-saturated fats, trans fat free, and low-fat foods at the supermarket. Select cheeses that have only 2 to 3 g saturated fat per ounce. Use a heart-healthy tub margarine that is free of trans fats or partially hydrogenated oil. When buying baked goods (cookies, crackers), avoid partially hydrogenated oils. Breads and muffins should be made from whole grains (whole-wheat or whole oat flour, instead of "flour" or "enriched flour"). Buy non-creamy canned soups with reduced salt and no added fats.  FOOD PREPARATION TECHNIQUES  Never deep-fry. If you must fry, either stir-fry, which uses very little fat, or use non-stick cooking sprays. When possible, broil, bake, or roast meats, and steam vegetables. Instead of putting butter or margarine on vegetables, use lemon and herbs, applesauce, and cinnamon (for squash and sweet potatoes). Use nonfat   yogurt, salsa, and low-fat dressings for salads.  LOW-SATURATED FAT / LOW-FAT FOOD SUBSTITUTES Meats / Saturated Fat (g)  Avoid: Steak, marbled (3 oz/85 g) / 11 g  Choose: Steak, lean (3 oz/85 g) / 4 g  Avoid: Hamburger (3 oz/85 g) / 7  g  Choose: Hamburger, lean (3 oz/85 g) / 5 g  Avoid: Ham (3 oz/85 g) / 6 g  Choose: Ham, lean cut (3 oz/85 g) / 2.4 g  Avoid: Chicken, with skin, dark meat (3 oz/85 g) / 4 g  Choose: Chicken, skin removed, dark meat (3 oz/85 g) / 2 g  Avoid: Chicken, with skin, light meat (3 oz/85 g) / 2.5 g  Choose: Chicken, skin removed, light meat (3 oz/85 g) / 1 g Dairy / Saturated Fat (g)  Avoid: Whole milk (1 cup) / 5 g  Choose: Low-fat milk, 2% (1 cup) / 3 g  Choose: Low-fat milk, 1% (1 cup) / 1.5 g  Choose: Skim milk (1 cup) / 0.3 g  Avoid: Hard cheese (1 oz/28 g) / 6 g  Choose: Skim milk cheese (1 oz/28 g) / 2 to 3 g  Avoid: Cottage cheese, 4% fat (1 cup) / 6.5 g  Choose: Low-fat cottage cheese, 1% fat (1 cup) / 1.5 g  Avoid: Ice cream (1 cup) / 9 g  Choose: Sherbet (1 cup) / 2.5 g  Choose: Nonfat frozen yogurt (1 cup) / 0.3 g  Choose: Frozen fruit bar / trace  Avoid: Whipped cream (1 tbs) / 3.5 g  Choose: Nondairy whipped topping (1 tbs) / 1 g Condiments / Saturated Fat (g)  Avoid: Mayonnaise (1 tbs) / 2 g  Choose: Low-fat mayonnaise (1 tbs) / 1 g  Avoid: Butter (1 tbs) / 7 g  Choose: Extra light margarine (1 tbs) / 1 g  Avoid: Coconut oil (1 tbs) / 11.8 g  Choose: Olive oil (1 tbs) / 1.8 g  Choose: Corn oil (1 tbs) / 1.7 g  Choose: Safflower oil (1 tbs) / 1.2 g  Choose: Sunflower oil (1 tbs) / 1.4 g  Choose: Soybean oil (1 tbs) / 2.4 g  Choose: Canola oil (1 tbs) / 1 g Document Released: 11/11/2005 Document Revised: 03/08/2013 Document Reviewed: 05/02/2011 [employer] Patient Information [address], [place].  Check on insurance coverage for shingles vaccine We will make referral to general surgeon regarding left upper back mass Schedule followup fasting lipids in 6 months

## 2013-10-14 NOTE — Progress Notes (Signed)
Pre visit review using our clinic review tool, if applicable. No additional management support is needed unless otherwise documented below in the visit note. 

## 2013-10-19 ENCOUNTER — Encounter: Payer: Self-pay | Admitting: Family Medicine

## 2013-10-27 ENCOUNTER — Ambulatory Visit (INDEPENDENT_AMBULATORY_CARE_PROVIDER_SITE_OTHER): Payer: BC Managed Care – PPO | Admitting: General Surgery

## 2013-12-15 ENCOUNTER — Encounter (INDEPENDENT_AMBULATORY_CARE_PROVIDER_SITE_OTHER): Payer: Self-pay

## 2013-12-15 ENCOUNTER — Ambulatory Visit (INDEPENDENT_AMBULATORY_CARE_PROVIDER_SITE_OTHER): Payer: BC Managed Care – PPO | Admitting: General Surgery

## 2013-12-15 ENCOUNTER — Encounter (INDEPENDENT_AMBULATORY_CARE_PROVIDER_SITE_OTHER): Payer: Self-pay | Admitting: General Surgery

## 2013-12-15 VITALS — BP 136/86 | HR 68 | Resp 12 | Ht 69.5 in | Wt 194.0 lb

## 2013-12-15 DIAGNOSIS — D1779 Benign lipomatous neoplasm of other sites: Secondary | ICD-10-CM

## 2013-12-15 DIAGNOSIS — D171 Benign lipomatous neoplasm of skin and subcutaneous tissue of trunk: Secondary | ICD-10-CM | POA: Insufficient documentation

## 2013-12-15 NOTE — Progress Notes (Signed)
Patient ID: Jon Moses, male   DOB: 11/16/1951, 62 y.o.   MRN: [id number]  No chief complaint on file.   HPI Jon Moses is a 62 y.o. male.  Chief complaint: Lipoma left back HPI Patient is a several year history of a mass on his left back. More recently, it causes pain at times. This is associated sometimes when he plays tennis. He has never had any drainage from the skin. No history of other similar masses. Past Medical History  Diagnosis Date  . HYPERTENSION 07/17/2010  . Hyperlipidemia 03/26/2011    Past Surgical History  Procedure Laterality Date  . Inner ear surgery  1975    left    Family History  Problem Relation Age of Onset  . Hypertension Other   . Hypertension Mother   . Stroke Father     Social History History  Substance Use Topics  . Smoking status: Never Smoker   . Smokeless tobacco: Not on file  . Alcohol Use: Not on file    No Known Allergies  Current Outpatient Prescriptions  Medication Sig Dispense Refill  . olmesartan-hydrochlorothiazide (BENICAR HCT) 40-12.5 MG per tablet Take 1 tablet by mouth daily.        . tadalafil (CIALIS) 20 MG tablet Every other day as needed.  6 tablet  6   No current facility-administered medications for this visit.    Review of Systems Review of Systems  Constitutional: Negative for fever, chills and unexpected weight change.  HENT: Negative for congestion, hearing loss, sore throat, trouble swallowing and voice change.   Eyes: Negative for visual disturbance.  Respiratory: Negative for cough and wheezing.   Cardiovascular: Negative for chest pain, palpitations and leg swelling.  Gastrointestinal: Negative for nausea, vomiting, abdominal pain, diarrhea, constipation, blood in stool, abdominal distention, anal bleeding and rectal pain.  Genitourinary: Negative for hematuria and difficulty urinating.  Musculoskeletal: Negative for arthralgias.       See history of present illness  Skin: Negative for rash and  wound.  Neurological: Negative for seizures, syncope, weakness and headaches.  Hematological: Negative for adenopathy. Does not bruise/bleed easily.  Psychiatric/Behavioral: Negative for confusion.    Blood pressure 136/86, pulse 68, resp. rate 12, height 5' 9.5" (1.765 m), weight 194 lb (87.998 kg).  Physical Exam Physical Exam  Constitutional: He appears well-developed and well-nourished. No distress.  HENT:  Head: Normocephalic and atraumatic.  Eyes: EOM are normal. Pupils are equal, round, and reactive to light.  Neck: Normal range of motion. No tracheal deviation present.  Cardiovascular: Normal rate, normal heart sounds and intact distal pulses.   Pulmonary/Chest: Effort normal and breath sounds normal. No stridor. No respiratory distress. He has no wheezes. He has no rales.    5 cm mobile subcutaneous soft tissue mass, no overlying skin changes  Abdominal: Soft. Bowel sounds are normal. He exhibits no distension. There is no tenderness. There is no rebound and no guarding.  Musculoskeletal: Normal range of motion. He exhibits no tenderness.    Data Reviewed Office notes from Dr. Burchette  Assessment    Lipoma left back    Plan    I have offered excision as an outpatient surgical procedure. Procedure, risks, benefits were discussed in detail. He is agreeable.       Jon Moses E 12/15/2013, 12:33 PM    

## 2013-12-21 ENCOUNTER — Encounter (HOSPITAL_BASED_OUTPATIENT_CLINIC_OR_DEPARTMENT_OTHER): Payer: Self-pay | Admitting: *Deleted

## 2013-12-21 ENCOUNTER — Encounter (HOSPITAL_BASED_OUTPATIENT_CLINIC_OR_DEPARTMENT_OTHER)
Admission: RE | Admit: 2013-12-21 | Discharge: 2013-12-21 | Disposition: A | Discharge status: Home / Self Care | Payer: BC Managed Care – PPO | Source: Ambulatory Visit | Attending: General Surgery | Admitting: General Surgery

## 2013-12-21 LAB — BASIC METABOLIC PANEL
BUN: 20 mg/dL (ref 6–23)
CALCIUM: 9.6 mg/dL (ref 8.4–10.5)
CHLORIDE: 99 meq/L (ref 96–112)
CO2: 29 meq/L (ref 19–32)
Creatinine, Ser: 1.34 mg/dL (ref 0.50–1.35)
GFR calc non Af Amer: 56 mL/min — ABNORMAL LOW (ref >90)
GFR, EST AFRICAN AMERICAN: 64 mL/min — AB (ref >90)
Glucose, Bld: 78 mg/dL (ref 70–99)
Potassium: 4.6 mEq/L (ref 3.7–5.3)
SODIUM: 140 meq/L (ref 137–147)

## 2013-12-21 NOTE — Progress Notes (Signed)
To come in for bmet-[id number]-has not had ekg several yr-wife Mona

## 2013-12-21 NOTE — Pre-Procedure Instructions (Signed)
EKG reviewed by Dr. Chriss Driver; OK for surgery.

## 2013-12-24 ENCOUNTER — Ambulatory Visit (HOSPITAL_BASED_OUTPATIENT_CLINIC_OR_DEPARTMENT_OTHER)
Admission: RE | Admit: 2013-12-24 | Discharge: 2013-12-24 | Disposition: A | Discharge status: Home / Self Care | Payer: BC Managed Care – PPO | Source: Ambulatory Visit | Attending: General Surgery | Admitting: General Surgery

## 2013-12-24 ENCOUNTER — Encounter (HOSPITAL_BASED_OUTPATIENT_CLINIC_OR_DEPARTMENT_OTHER): Payer: BC Managed Care – PPO | Admitting: Certified Registered"

## 2013-12-24 ENCOUNTER — Encounter (HOSPITAL_BASED_OUTPATIENT_CLINIC_OR_DEPARTMENT_OTHER): Payer: Self-pay | Admitting: *Deleted

## 2013-12-24 ENCOUNTER — Encounter (HOSPITAL_BASED_OUTPATIENT_CLINIC_OR_DEPARTMENT_OTHER)
Admission: RE | Disposition: A | Discharge status: Home / Self Care | Payer: Self-pay | Source: Ambulatory Visit | Attending: General Surgery

## 2013-12-24 ENCOUNTER — Ambulatory Visit (HOSPITAL_BASED_OUTPATIENT_CLINIC_OR_DEPARTMENT_OTHER): Payer: BC Managed Care – PPO | Admitting: Certified Registered"

## 2013-12-24 DIAGNOSIS — I10 Essential (primary) hypertension: Secondary | ICD-10-CM | POA: Insufficient documentation

## 2013-12-24 DIAGNOSIS — D1739 Benign lipomatous neoplasm of skin and subcutaneous tissue of other sites: Secondary | ICD-10-CM

## 2013-12-24 DIAGNOSIS — D171 Benign lipomatous neoplasm of skin and subcutaneous tissue of trunk: Secondary | ICD-10-CM

## 2013-12-24 DIAGNOSIS — E785 Hyperlipidemia, unspecified: Secondary | ICD-10-CM | POA: Insufficient documentation

## 2013-12-24 DIAGNOSIS — D1779 Benign lipomatous neoplasm of other sites: Secondary | ICD-10-CM | POA: Insufficient documentation

## 2013-12-24 HISTORY — PX: LIPOMA EXCISION: SHX5283

## 2013-12-24 LAB — POCT HEMOGLOBIN-HEMACUE: HEMOGLOBIN: 14.6 g/dL (ref 13.0–17.0)

## 2013-12-24 SURGERY — EXCISION LIPOMA
Anesthesia: General | Site: Back | Laterality: Left

## 2013-12-24 MED ORDER — BUPIVACAINE-EPINEPHRINE 0.5% -1:200000 IJ SOLN
INTRAMUSCULAR | Status: DC | PRN
  Administered 2013-12-24: 20 mL

## 2013-12-24 MED ORDER — ONDANSETRON HCL 4 MG/2ML IJ SOLN: 4.0000 mg | Freq: Once | INTRAMUSCULAR | Status: DC | PRN

## 2013-12-24 MED ORDER — 0.9 % SODIUM CHLORIDE (POUR BTL) OPTIME
TOPICAL | Status: DC | PRN
  Administered 2013-12-24: 200 mL

## 2013-12-24 MED ORDER — DEXAMETHASONE SODIUM PHOSPHATE 4 MG/ML IJ SOLN
INTRAMUSCULAR | Status: DC | PRN
  Administered 2013-12-24: 10 mg via INTRAVENOUS

## 2013-12-24 MED ORDER — OXYCODONE HCL 5 MG PO TABS: 5.0000 mg | ORAL_TABLET | Freq: Once | ORAL | Status: DC | PRN

## 2013-12-24 MED ORDER — PROPOFOL 10 MG/ML IV BOLUS
INTRAVENOUS | Status: DC | PRN
  Administered 2013-12-24: 200 mg via INTRAVENOUS

## 2013-12-24 MED ORDER — OXYCODONE-ACETAMINOPHEN 5-325 MG PO TABS: 1.0000 | ORAL_TABLET | Freq: Four times a day (QID) | ORAL | Status: DC | PRN

## 2013-12-24 MED ORDER — MIDAZOLAM HCL 5 MG/5ML IJ SOLN
INTRAMUSCULAR | Status: DC | PRN
  Administered 2013-12-24: 2 mg via INTRAVENOUS

## 2013-12-24 MED ORDER — LACTATED RINGERS IV SOLN
INTRAVENOUS | Status: DC
  Administered 2013-12-24 (×2): via INTRAVENOUS

## 2013-12-24 MED ORDER — CEFAZOLIN SODIUM-DEXTROSE 2-3 GM-% IV SOLR
2.0000 g | INTRAVENOUS | Status: AC
  Administered 2013-12-24: 2 g via INTRAVENOUS

## 2013-12-24 MED ORDER — CHLORHEXIDINE GLUCONATE 4 % EX LIQD: 1.0000 "application " | Freq: Once | CUTANEOUS | Status: DC

## 2013-12-24 MED ORDER — EPHEDRINE SULFATE 50 MG/ML IJ SOLN
INTRAMUSCULAR | Status: DC | PRN
  Administered 2013-12-24: 10 mg via INTRAVENOUS
  Administered 2013-12-24: 20 mg via INTRAVENOUS

## 2013-12-24 MED ORDER — OXYCODONE HCL 5 MG/5ML PO SOLN: 5.0000 mg | Freq: Once | ORAL | Status: DC | PRN

## 2013-12-24 MED ORDER — PROPOFOL 10 MG/ML IV EMUL
INTRAVENOUS | Status: AC
  Filled 2013-12-24: qty 150

## 2013-12-24 MED ORDER — SUCCINYLCHOLINE CHLORIDE 20 MG/ML IJ SOLN
INTRAMUSCULAR | Status: DC | PRN
  Administered 2013-12-24: 100 mg via INTRAVENOUS

## 2013-12-24 MED ORDER — FENTANYL CITRATE 0.05 MG/ML IJ SOLN
INTRAMUSCULAR | Status: AC
Start: 2013-12-24 — End: 2013-12-24
  Filled 2013-12-24: qty 6

## 2013-12-24 MED ORDER — FENTANYL CITRATE 0.05 MG/ML IJ SOLN
INTRAMUSCULAR | Status: DC | PRN
  Administered 2013-12-24: 100 ug via INTRAVENOUS

## 2013-12-24 MED ORDER — MIDAZOLAM HCL 2 MG/2ML IJ SOLN: 1.0000 mg | INTRAMUSCULAR | Status: DC | PRN

## 2013-12-24 MED ORDER — BUPIVACAINE HCL (PF) 0.25 % IJ SOLN
INTRAMUSCULAR | Status: AC
  Filled 2013-12-24: qty 30

## 2013-12-24 MED ORDER — ONDANSETRON HCL 4 MG/2ML IJ SOLN
INTRAMUSCULAR | Status: DC | PRN
  Administered 2013-12-24: 4 mg via INTRAVENOUS

## 2013-12-24 MED ORDER — MIDAZOLAM HCL 2 MG/2ML IJ SOLN
INTRAMUSCULAR | Status: AC
  Filled 2013-12-24: qty 2

## 2013-12-24 MED ORDER — HYDROMORPHONE HCL PF 1 MG/ML IJ SOLN: 0.2500 mg | INTRAMUSCULAR | Status: DC | PRN

## 2013-12-24 MED ORDER — BUPIVACAINE-EPINEPHRINE PF 0.5-1:200000 % IJ SOLN
INTRAMUSCULAR | Status: AC
  Filled 2013-12-24: qty 30

## 2013-12-24 MED ORDER — FENTANYL CITRATE 0.05 MG/ML IJ SOLN: 50.0000 ug | INTRAMUSCULAR | Status: DC | PRN

## 2013-12-24 MED ORDER — LIDOCAINE HCL (CARDIAC) 20 MG/ML IV SOLN
INTRAVENOUS | Status: DC | PRN
  Administered 2013-12-24: 80 mg via INTRAVENOUS

## 2013-12-24 MED ORDER — CEFAZOLIN SODIUM 1-5 GM-% IV SOLN
INTRAVENOUS | Status: AC
  Filled 2013-12-24: qty 100

## 2013-12-24 SURGICAL SUPPLY — 64 items
ADH SKN CLS APL DERMABOND .7 (GAUZE/BANDAGES/DRESSINGS) ×1
APL SKNCLS STERI-STRIP NONHPOA (GAUZE/BANDAGES/DRESSINGS)
BENZOIN TINCTURE PRP APPL 2/3 (GAUZE/BANDAGES/DRESSINGS) IMPLANT
BLADE HEX COATED 2.75 (ELECTRODE) ×3 IMPLANT
BLADE SURG 15 STRL LF DISP TIS (BLADE) ×1 IMPLANT
BLADE SURG 15 STRL SS (BLADE) ×3
BLADE SURG ROTATE 9660 (MISCELLANEOUS) IMPLANT
CANISTER SUCT 1200ML W/VALVE (MISCELLANEOUS) IMPLANT
CHLORAPREP W/TINT 26ML (MISCELLANEOUS) ×3 IMPLANT
CLOSURE WOUND 1/2 X4 (GAUZE/BANDAGES/DRESSINGS)
COVER MAYO STAND STRL (DRAPES) ×3 IMPLANT
COVER TABLE BACK 60X90 (DRAPES) ×3 IMPLANT
DECANTER SPIKE VIAL GLASS SM (MISCELLANEOUS) ×1 IMPLANT
DERMABOND ADVANCED (GAUZE/BANDAGES/DRESSINGS) ×2
DERMABOND ADVANCED .7 DNX12 (GAUZE/BANDAGES/DRESSINGS) IMPLANT
DRAPE PED LAPAROTOMY (DRAPES) ×3 IMPLANT
DRAPE U-SHAPE 76X120 STRL (DRAPES) IMPLANT
DRAPE UTILITY XL STRL (DRAPES) ×3 IMPLANT
ELECT REM PT RETURN 9FT ADLT (ELECTROSURGICAL) ×3
ELECTRODE REM PT RTRN 9FT ADLT (ELECTROSURGICAL) ×1 IMPLANT
GLOVE BIO SURGEON STRL SZ8 (GLOVE) ×3 IMPLANT
GLOVE BIOGEL PI IND STRL 7.0 (GLOVE) IMPLANT
GLOVE BIOGEL PI IND STRL 7.5 (GLOVE) IMPLANT
GLOVE BIOGEL PI IND STRL 8 (GLOVE) ×1 IMPLANT
GLOVE BIOGEL PI INDICATOR 7.0 (GLOVE) ×4
GLOVE BIOGEL PI INDICATOR 7.5 (GLOVE) ×2
GLOVE BIOGEL PI INDICATOR 8 (GLOVE) ×2
GLOVE ECLIPSE 6.5 STRL STRAW (GLOVE) ×2 IMPLANT
GLOVE ECLIPSE 7.0 STRL STRAW (GLOVE) ×2 IMPLANT
GOWN STRL REUS W/ TWL LRG LVL3 (GOWN DISPOSABLE) ×1 IMPLANT
GOWN STRL REUS W/ TWL XL LVL3 (GOWN DISPOSABLE) ×1 IMPLANT
GOWN STRL REUS W/TWL LRG LVL3 (GOWN DISPOSABLE) ×6
GOWN STRL REUS W/TWL XL LVL3 (GOWN DISPOSABLE) ×3
NDL HYPO 25X1 1.5 SAFETY (NEEDLE) ×1 IMPLANT
NEEDLE 27GAX1X1/2 (NEEDLE) IMPLANT
NEEDLE HYPO 25X1 1.5 SAFETY (NEEDLE) ×3 IMPLANT
NS IRRIG 1000ML POUR BTL (IV SOLUTION) ×2 IMPLANT
PACK BASIN DAY SURGERY FS (CUSTOM PROCEDURE TRAY) ×3 IMPLANT
PENCIL BUTTON HOLSTER BLD 10FT (ELECTRODE) ×3 IMPLANT
SHEET MEDIUM DRAPE 40X70 STRL (DRAPES) IMPLANT
SLEEVE SCD COMPRESS KNEE MED (MISCELLANEOUS) ×2 IMPLANT
SPONGE GAUZE 4X4 12PLY (GAUZE/BANDAGES/DRESSINGS) IMPLANT
SPONGE GAUZE 4X4 12PLY STER LF (GAUZE/BANDAGES/DRESSINGS) ×3 IMPLANT
SPONGE LAP 4X18 X RAY DECT (DISPOSABLE) IMPLANT
STRIP CLOSURE SKIN 1/2X4 (GAUZE/BANDAGES/DRESSINGS) IMPLANT
SUT ETHILON 3 0 PS 1 (SUTURE) IMPLANT
SUT ETHILON 5 0 PS 2 18 (SUTURE) IMPLANT
SUT MON AB 3-0 SH 27 (SUTURE)
SUT MON AB 3-0 SH27 (SUTURE) IMPLANT
SUT MON AB 4-0 PC3 18 (SUTURE) ×2 IMPLANT
SUT SILK 2 0 FS (SUTURE) IMPLANT
SUT VIC AB 3-0 PS1 18 (SUTURE)
SUT VIC AB 3-0 PS1 18XBRD (SUTURE) IMPLANT
SUT VIC AB 3-0 SH 27 (SUTURE) ×6
SUT VIC AB 3-0 SH 27X BRD (SUTURE) IMPLANT
SUT VIC AB 4-0 SH 18 (SUTURE) IMPLANT
SUT VIC AB 5-0 PS2 18 (SUTURE) IMPLANT
SYR BULB 3OZ (MISCELLANEOUS) IMPLANT
SYR CONTROL 10ML LL (SYRINGE) ×3 IMPLANT
TOWEL OR 17X24 6PK STRL BLUE (TOWEL DISPOSABLE) ×4 IMPLANT
TOWEL OR NON WOVEN STRL DISP B (DISPOSABLE) ×3 IMPLANT
TUBE CONNECTING 20'X1/4 (TUBING)
TUBE CONNECTING 20X1/4 (TUBING) IMPLANT
YANKAUER SUCT BULB TIP NO VENT (SUCTIONS) IMPLANT

## 2013-12-24 NOTE — Anesthesia Procedure Notes (Signed)
Procedure Name: Intubation Date/Time: 12/24/2013 7:30 AM Performed by: Quantez Schnyder Pre-anesthesia Checklist: Patient identified, Emergency Drugs available, Suction available and Patient being monitored Patient Re-evaluated:Patient Re-evaluated prior to inductionOxygen Delivery Method: Circle System Utilized Preoxygenation: Pre-oxygenation with 100% oxygen Intubation Type: IV induction Ventilation: Mask ventilation without difficulty Laryngoscope Size: Mac and 3 Grade View: Grade III Tube type: Oral Tube size: 7.0 mm Number of attempts: 2 Airway Equipment and Method: stylet and Video-laryngoscopy Placement Confirmation: ETT inserted through vocal cords under direct vision,  positive ETCO2 and breath sounds checked- equal and bilateral Secured at: 22 cm Tube secured with: Tape Dental Injury: Teeth and Oropharynx as per pre-operative assessment  Comments: Able to visualize epiglottis, LTA used, unable to view VC , switch to glide scope DL x 1 VC easily visualized AOI +BBS, + ETCO2, dentition unchanged

## 2013-12-24 NOTE — Transfer of Care (Signed)
Immediate Anesthesia Transfer of Care Note  Patient: Jon Moses  Procedure(s) Performed: Procedure(s): EXCISION MASS LEFT BACK (Left)  Patient Location: PACU  Anesthesia Type:General  Level of Consciousness: awake, alert , oriented and patient cooperative  Airway & Oxygen Therapy: Patient Spontanous Breathing and Patient connected to face mask oxygen  Post-op Assessment: Report given to PACU RN and Post -op Vital signs reviewed and stable  Post vital signs: Reviewed and stable  Complications: No apparent anesthesia complications

## 2013-12-24 NOTE — Interval H&P Note (Signed)
History and Physical Interval Note:  12/24/2013 7:17 AM  Jon Moses  has presented today for surgery, with the diagnosis of lipoma left back   The various methods of treatment have been discussed with the patient and family. After consideration of risks, benefits and other options for treatment, the patient has consented to  Procedure(s): EXCISION LIPOMA (Left) as a surgical intervention .  The patient's history has been reviewed, patient re-examined, site marked, no change in status, stable for surgery.  I have reviewed the patient's chart and labs.  Questions were answered to the patient's satisfaction.     [name] E

## 2013-12-24 NOTE — Discharge Instructions (Signed)

## 2013-12-24 NOTE — Op Note (Signed)
12/24/2013  8:19 AM  PATIENT:  Jon Moses  63 y.o. male  PRE-OPERATIVE DIAGNOSIS:  lipoma left back   POST-OPERATIVE DIAGNOSIS:  lipoma left back   PROCEDURE:  Procedure(s): EXCISION MASS LEFT BACK 6X5CM WITH LAYERED CLOSURE  SURGEON:  Surgeon(s): Zenovia Jarred, MD  PHYSICIAN ASSISTANT:   ASSISTANTS: none   ANESTHESIA:   local and general  EBL:  Total I/O In: 1000 [I.V.:1000] Out: -   BLOOD ADMINISTERED:none  DRAINS: none   SPECIMEN:  Excision  DISPOSITION OF SPECIMEN:  PATHOLOGY  COUNTS:  YES  DICTATION: .Dragon Dictation  Patient presented for excision mass left back. He was identified in the preop holding area. Informed consent was obtained. He received intravenous antibiotics. He was brought to the operating room and general endotracheal anesthesia was administered by the anesthesia staff. He was placed in prone position with appropriate padding. His back was prepped and draped in a sterile fashion. Time out procedure was done. Half percent Marcaine with epinephrine was injected over and around this palpable mass. Incision was made over the mass along tissue planes lines. Subcutaneous tissues were dissected down revealing a partially encapsulated mass. It was circumferentially dissected. It was then freed up from the underlying fascia to which it was quite adherent. Mass was removed in one piece and sent to pathology. Wound was copiously irrigated. Hemostasis was obtained. Additional local was injected. Wound was closed in layers with subcutaneous tissues approximated with interrupted 3-0 Vicryl and the skin closed with running 4-0 Monocryl followed by Dermabond. All counts were correct. Patient tolerated the procedure well without apparent complication was taken recovery in stable condition.  PATIENT DISPOSITION:  PACU - hemodynamically stable.   Delay start of Pharmacological VTE agent (>24hrs) due to surgical blood loss or risk of bleeding:  no  Georganna Skeans,  MD, MPH, FACS Pager: [phone]  1/30/20158:19 AM

## 2013-12-24 NOTE — H&P (View-Only) (Signed)
Patient ID: Jon Moses, male   DOB: Dec 28, 1950, 63 y.o.   MRN: [id number]  No chief complaint on file.   HPI Jon Moses is a 63 y.o. male.  Chief complaint: Lipoma left back HPI Patient is a several year history of a mass on his left back. More recently, it causes pain at times. This is associated sometimes when he plays tennis. He has never had any drainage from the skin. No history of other similar masses. Past Medical History  Diagnosis Date  . HYPERTENSION 07/17/2010  . Hyperlipidemia 03/26/2011    Past Surgical History  Procedure Laterality Date  . Inner ear surgery  1975    left    Family History  Problem Relation Age of Onset  . Hypertension Other   . Hypertension Mother   . Stroke Father     Social History History  Substance Use Topics  . Smoking status: Never Smoker   . Smokeless tobacco: Not on file  . Alcohol Use: Not on file    No Known Allergies  Current Outpatient Prescriptions  Medication Sig Dispense Refill  . olmesartan-hydrochlorothiazide (BENICAR HCT) 40-12.5 MG per tablet Take 1 tablet by mouth daily.        . tadalafil (CIALIS) 20 MG tablet Every other day as needed.  6 tablet  6   No current facility-administered medications for this visit.    Review of Systems Review of Systems  Constitutional: Negative for fever, chills and unexpected weight change.  HENT: Negative for congestion, hearing loss, sore throat, trouble swallowing and voice change.   Eyes: Negative for visual disturbance.  Respiratory: Negative for cough and wheezing.   Cardiovascular: Negative for chest pain, palpitations and leg swelling.  Gastrointestinal: Negative for nausea, vomiting, abdominal pain, diarrhea, constipation, blood in stool, abdominal distention, anal bleeding and rectal pain.  Genitourinary: Negative for hematuria and difficulty urinating.  Musculoskeletal: Negative for arthralgias.       See history of present illness  Skin: Negative for rash and  wound.  Neurological: Negative for seizures, syncope, weakness and headaches.  Hematological: Negative for adenopathy. Does not bruise/bleed easily.  Psychiatric/Behavioral: Negative for confusion.    Blood pressure 136/86, pulse 68, resp. rate 12, height 5' 9.5" (1.765 m), weight 194 lb (87.998 kg).  Physical Exam Physical Exam  Constitutional: He appears well-developed and well-nourished. No distress.  HENT:  Head: Normocephalic and atraumatic.  Eyes: EOM are normal. Pupils are equal, round, and reactive to light.  Neck: Normal range of motion. No tracheal deviation present.  Cardiovascular: Normal rate, normal heart sounds and intact distal pulses.   Pulmonary/Chest: Effort normal and breath sounds normal. No stridor. No respiratory distress. He has no wheezes. He has no rales.    5 cm mobile subcutaneous soft tissue mass, no overlying skin changes  Abdominal: Soft. Bowel sounds are normal. He exhibits no distension. There is no tenderness. There is no rebound and no guarding.  Musculoskeletal: Normal range of motion. He exhibits no tenderness.    Data Reviewed Office notes from Dr. Elease Hashimoto  Assessment    Lipoma left back    Plan    I have offered excision as an outpatient surgical procedure. Procedure, risks, benefits were discussed in detail. He is agreeable.       Yul Diana E 12/15/2013, 12:33 PM

## 2013-12-24 NOTE — Anesthesia Postprocedure Evaluation (Signed)
  Anesthesia Post-op Note  Patient: Jon Moses  Procedure(s) Performed: Procedure(s): EXCISION MASS LEFT BACK (Left)  Patient Location: PACU  Anesthesia Type:General  Level of Consciousness: awake, alert  and oriented  Airway and Oxygen Therapy: Patient Spontanous Breathing  Post-op Pain: mild  Post-op Assessment: Post-op Vital signs reviewed, Patient's Cardiovascular Status Stable, Respiratory Function Stable, Patent Airway and Pain level controlled  Post-op Vital Signs: stable  Complications: No apparent anesthesia complications

## 2013-12-24 NOTE — Anesthesia Preprocedure Evaluation (Addendum)
Anesthesia Evaluation  Patient identified by MRN, date of birth, ID band Patient awake    Reviewed: Allergy & Precautions, NPO status   Airway Mallampati: II TM Distance: >3 FB Neck ROM: Full    Dental  (+) Teeth Intact and Dental Advisory Given   Pulmonary  breath sounds clear to auscultation        Cardiovascular hypertension, Pt. on medications Rhythm:Regular Rate:Normal     Neuro/Psych    GI/Hepatic   Endo/Other    Renal/GU      Musculoskeletal   Abdominal   Peds  Hematology   Anesthesia Other Findings   Reproductive/Obstetrics                         Anesthesia Physical Anesthesia Plan  ASA: II  Anesthesia Plan: General   Post-op Pain Management:    Induction: Intravenous  Airway Management Planned: Oral ETT  Additional Equipment:   Intra-op Plan:   Post-operative Plan:   Informed Consent: I have reviewed the patients History and Physical, chart, labs and discussed the procedure including the risks, benefits and alternatives for the proposed anesthesia with the patient or authorized representative who has indicated his/her understanding and acceptance.   Dental advisory given  Plan Discussed with: CRNA and Anesthesiologist  Anesthesia Plan Comments: (Hypertension Lipoma L. Upper back  Plan GA with oral ETT  Roberts Gaudy, MD)        Anesthesia Quick Evaluation

## 2013-12-27 ENCOUNTER — Encounter (HOSPITAL_BASED_OUTPATIENT_CLINIC_OR_DEPARTMENT_OTHER): Payer: Self-pay | Admitting: General Surgery

## 2014-01-10 ENCOUNTER — Ambulatory Visit (INDEPENDENT_AMBULATORY_CARE_PROVIDER_SITE_OTHER): Payer: BC Managed Care – PPO | Admitting: General Surgery

## 2014-01-10 ENCOUNTER — Encounter (INDEPENDENT_AMBULATORY_CARE_PROVIDER_SITE_OTHER): Payer: Self-pay | Admitting: General Surgery

## 2014-01-10 VITALS — BP 148/86 | HR 72 | Resp 14 | Ht 70.0 in | Wt 197.0 lb

## 2014-01-10 DIAGNOSIS — D1779 Benign lipomatous neoplasm of other sites: Secondary | ICD-10-CM

## 2014-01-10 DIAGNOSIS — D171 Benign lipomatous neoplasm of skin and subcutaneous tissue of trunk: Secondary | ICD-10-CM

## 2014-01-10 NOTE — Progress Notes (Signed)
Subjective:     Patient ID: Jon Moses, male   DOB: [birth year], 63 y.o.   MRN: [id number]  HPI  Patient status post removal of lipoma left back. He is doing well. Review of Systems     Objective:   Physical Exam Residual Dermabond was removed. Wound is nicely healed. No signs of infection.    Assessment:     Status post removal of lipoma left back, doing well     Plan:     Return to activities gradually. No dressing required. Return as needed. Pathology was reviewed.

## 2014-04-13 ENCOUNTER — Other Ambulatory Visit: Payer: BC Managed Care – PPO

## 2014-07-04 ENCOUNTER — Telehealth: Payer: Self-pay | Admitting: Family Medicine

## 2014-07-04 NOTE — Telephone Encounter (Signed)
Pt was given 3 boxes. Left message on [facility] that samples are ready for pick up

## 2014-07-04 NOTE — Telephone Encounter (Signed)
Pt needs samples of cialis 20 mg

## 2016-01-26 ENCOUNTER — Other Ambulatory Visit: Payer: BC Managed Care – PPO

## 2016-01-26 ENCOUNTER — Other Ambulatory Visit (INDEPENDENT_AMBULATORY_CARE_PROVIDER_SITE_OTHER): Payer: BC Managed Care – PPO

## 2016-01-26 DIAGNOSIS — Z0000 Encounter for general adult medical examination without abnormal findings: Secondary | ICD-10-CM

## 2016-01-26 LAB — CBC WITH DIFFERENTIAL/PLATELET
BASOS ABS: 0 10*3/uL (ref 0.0–0.1)
Basophils Relative: 1 % (ref 0.0–3.0)
EOS ABS: 0.1 10*3/uL (ref 0.0–0.7)
Eosinophils Relative: 3.3 % (ref 0.0–5.0)
HCT: 42.1 % (ref 39.0–52.0)
Hemoglobin: 14.4 g/dL (ref 13.0–17.0)
LYMPHS ABS: 1.6 10*3/uL (ref 0.7–4.0)
Lymphocytes Relative: 45.6 % (ref 12.0–46.0)
MCHC: 34.2 g/dL (ref 30.0–36.0)
MCV: 88.3 fl (ref 78.0–100.0)
MONOS PCT: 8.8 % (ref 3.0–12.0)
Monocytes Absolute: 0.3 10*3/uL (ref 0.1–1.0)
NEUTROS ABS: 1.5 10*3/uL (ref 1.4–7.7)
NEUTROS PCT: 41.3 % — AB (ref 43.0–77.0)
PLATELETS: 244 10*3/uL (ref 150.0–400.0)
RBC: 4.77 Mil/uL (ref 4.22–5.81)
RDW: 13.6 % (ref 11.5–15.5)
WBC: 3.6 10*3/uL — ABNORMAL LOW (ref 4.0–10.5)

## 2016-01-26 LAB — TSH: TSH: 0.78 u[IU]/mL (ref 0.35–4.50)

## 2016-01-26 LAB — LIPID PANEL
CHOL/HDL RATIO: 5
Cholesterol: 232 mg/dL — ABNORMAL HIGH (ref 0–200)
HDL: 46.2 mg/dL (ref >39.00)
LDL CALC: 161 mg/dL — AB (ref 0–99)
NONHDL: 186.02
Triglycerides: 124 mg/dL (ref 0.0–149.0)
VLDL: 24.8 mg/dL (ref 0.0–40.0)

## 2016-01-26 LAB — BASIC METABOLIC PANEL
BUN: 15 mg/dL (ref 6–23)
CALCIUM: 9.5 mg/dL (ref 8.4–10.5)
CO2: 29 mEq/L (ref 19–32)
Chloride: 100 mEq/L (ref 96–112)
Creatinine, Ser: 1.21 mg/dL (ref 0.40–1.50)
GFR: 77.84 mL/min (ref >60.00)
GLUCOSE: 119 mg/dL — AB (ref 70–99)
Potassium: 4.6 mEq/L (ref 3.5–5.1)
SODIUM: 137 meq/L (ref 135–145)

## 2016-01-26 LAB — HEPATIC FUNCTION PANEL
ALK PHOS: 49 U/L (ref 39–117)
ALT: 22 U/L (ref 0–53)
AST: 14 U/L (ref 0–37)
Albumin: 4.4 g/dL (ref 3.5–5.2)
BILIRUBIN DIRECT: 0.1 mg/dL (ref 0.0–0.3)
BILIRUBIN TOTAL: 0.7 mg/dL (ref 0.2–1.2)
Total Protein: 6.9 g/dL (ref 6.0–8.3)

## 2016-01-26 LAB — PSA: PSA: 1.12 ng/mL (ref 0.10–4.00)

## 2016-02-01 ENCOUNTER — Encounter: Payer: BC Managed Care – PPO | Admitting: Family Medicine

## 2016-02-07 ENCOUNTER — Ambulatory Visit (INDEPENDENT_AMBULATORY_CARE_PROVIDER_SITE_OTHER): Payer: BC Managed Care – PPO | Admitting: Family Medicine

## 2016-02-07 ENCOUNTER — Encounter: Payer: Self-pay | Admitting: Family Medicine

## 2016-02-07 VITALS — BP 160/106 | HR 76 | Temp 97.8°F | Ht 67.5 in | Wt 192.0 lb

## 2016-02-07 DIAGNOSIS — E785 Hyperlipidemia, unspecified: Secondary | ICD-10-CM

## 2016-02-07 DIAGNOSIS — Z0000 Encounter for general adult medical examination without abnormal findings: Secondary | ICD-10-CM | POA: Diagnosis not present

## 2016-02-07 DIAGNOSIS — R7303 Prediabetes: Secondary | ICD-10-CM

## 2016-02-07 DIAGNOSIS — Z23 Encounter for immunization: Secondary | ICD-10-CM

## 2016-02-07 DIAGNOSIS — E1165 Type 2 diabetes mellitus with hyperglycemia: Secondary | ICD-10-CM | POA: Insufficient documentation

## 2016-02-07 MED ORDER — OLMESARTAN MEDOXOMIL-HCTZ 40-12.5 MG PO TABS: 1.0000 | ORAL_TABLET | Freq: Every day | ORAL | Status: DC

## 2016-02-07 NOTE — Patient Instructions (Signed)
Fat and Cholesterol Restricted Diet High levels of fat and cholesterol in your blood may lead to various health problems, such as diseases of the heart, blood vessels, gallbladder, liver, and pancreas. Fats are concentrated sources of energy that come in various forms. Certain types of fat, including saturated fat, may be harmful in excess. Cholesterol is a substance needed by your body in small amounts. Your body makes all the cholesterol it needs. Excess cholesterol comes from the food you eat. When you have high levels of cholesterol and saturated fat in your blood, health problems can develop because the excess fat and cholesterol will gather along the walls of your blood vessels, causing them to narrow. Choosing the right foods will help you control your intake of fat and cholesterol. This will help keep the levels of these substances in your blood within normal limits and reduce your risk of disease. WHAT IS MY PLAN? Your health care provider recommends that you:  Get no more than __________ % of the total calories in your daily diet from fat.  Limit your intake of saturated fat to less than ______% of your total calories each day.  Limit the amount of cholesterol in your diet to less than _________mg per day. WHAT TYPES OF FAT SHOULD I CHOOSE?  Choose healthy fats more often. Choose monounsaturated and polyunsaturated fats, such as olive and canola oil, flaxseeds, walnuts, almonds, and seeds.  Eat more omega-3 fats. Good choices include salmon, mackerel, sardines, tuna, flaxseed oil, and ground flaxseeds. Aim to eat fish at least two times a week.  Limit saturated fats. Saturated fats are primarily found in animal products, such as meats, butter, and cream. Plant sources of saturated fats include palm oil, palm kernel oil, and coconut oil.  Avoid foods with partially hydrogenated oils in them. These contain trans fats. Examples of foods that contain trans fats are stick margarine, some tub  margarines, cookies, crackers, and other baked goods. WHAT GENERAL GUIDELINES DO I NEED TO FOLLOW? These guidelines for healthy eating will help you control your intake of fat and cholesterol:  Check food labels carefully to identify foods with trans fats or high amounts of saturated fat.  Fill one half of your plate with vegetables and green salads.  Fill one fourth of your plate with whole grains. Look for the word "whole" as the first word in the ingredient list.  Fill one fourth of your plate with lean protein foods.  Limit fruit to two servings a day. Choose fruit instead of juice.  Eat more foods that contain soluble fiber. Examples of foods that contain this type of fiber are apples, broccoli, carrots, beans, peas, and barley. Aim to get 20-30 g of fiber per day.  Eat more home-cooked food and less restaurant, buffet, and fast food.  Limit or avoid alcohol.  Limit foods high in starch and sugar.  Limit fried foods.  Cook foods using methods other than frying. Baking, boiling, grilling, and broiling are all great options.  Lose weight if you are overweight. Losing just 5-10% of your initial body weight can help your overall health and prevent diseases such as diabetes and heart disease. WHAT FOODS CAN I EAT? Grains Whole grains, such as whole wheat or whole grain breads, crackers, cereals, and pasta. Unsweetened oatmeal, bulgur, barley, quinoa, or brown rice. Corn or whole wheat flour tortillas. Vegetables Fresh or frozen vegetables (raw, steamed, roasted, or grilled). Green salads. Fruits All fresh, canned (in natural juice), or frozen fruits. Meat and  Other Protein Products Ground beef (85% or leaner), grass-fed beef, or beef trimmed of fat. Skinless chicken or [place]. Ground chicken or [place]. Pork trimmed of fat. All fish and seafood. Eggs. Dried beans, peas, or lentils. Unsalted nuts or seeds. Unsalted canned or dry beans. Dairy Low-fat dairy products, such as skim or  1% milk, 2% or reduced-fat cheeses, low-fat ricotta or cottage cheese, or plain low-fat yogurt. Fats and Oils Tub margarines without trans fats. Light or reduced-fat mayonnaise and salad dressings. Avocado. Olive, canola, sesame, or safflower oils. Natural peanut or almond butter (choose ones without added sugar and oil). The items listed above may not be a complete list of recommended foods or beverages. Contact your dietitian for more options. WHAT FOODS ARE NOT RECOMMENDED? Grains White bread. White pasta. White rice. Cornbread. Bagels, pastries, and croissants. Crackers that contain trans fat. Vegetables White potatoes. Corn. Creamed or fried vegetables. Vegetables in a cheese sauce. Fruits Dried fruits. Canned fruit in light or heavy syrup. Fruit juice. Meat and Other Protein Products Fatty cuts of meat. Ribs, chicken wings, bacon, sausage, bologna, salami, chitterlings, fatback, hot dogs, bratwurst, and packaged luncheon meats. Liver and organ meats. Dairy Whole or 2% milk, cream, half-and-half, and cream cheese. Whole milk cheeses. Whole-fat or sweetened yogurt. Full-fat cheeses. Nondairy creamers and whipped toppings. Processed cheese, cheese spreads, or cheese curds. Sweets and Desserts Corn syrup, sugars, honey, and molasses. Candy. Jam and jelly. Syrup. Sweetened cereals. Cookies, pies, cakes, donuts, muffins, and ice cream. Fats and Oils Butter, stick margarine, lard, shortening, ghee, or bacon fat. Coconut, palm kernel, or palm oils. Beverages Alcohol. Sweetened drinks (such as sodas, lemonade, and fruit drinks or punches). The items listed above may not be a complete list of foods and beverages to avoid. Contact your dietitian for more information.   This information is not intended to replace advice given to you by your health care provider. Make sure you discuss any questions you have with your health care provider.   Document Released: 11/11/2005 Document Revised: 12/02/2014  Document Reviewed: 02/09/2014 Elsevier Interactive Patient Education 2016 [place]. Hyperglycemia Hyperglycemia occurs when the glucose (sugar) in your blood is too high. Hyperglycemia can happen for many reasons, but it most often happens to people who do not know they have diabetes or are not managing their diabetes properly.  CAUSES  Whether you have diabetes or not, there are other causes of hyperglycemia. Hyperglycemia can occur when you have diabetes, but it can also occur in other situations that you might not be as aware of, such as: Diabetes  If you have diabetes and are having problems controlling your blood glucose, hyperglycemia could occur because of some of the following reasons:  Not following your meal plan.  Not taking your diabetes medications or not taking it properly.  Exercising less or doing less activity than you normally do.  Being sick. Pre-diabetes  This cannot be ignored. Before people develop Type 2 diabetes, they almost always have "pre-diabetes." This is when your blood glucose levels are higher than normal, but not yet high enough to be diagnosed as diabetes. Research has shown that some long-term damage to the body, especially the heart and circulatory system, may already be occurring during pre-diabetes. If you take action to manage your blood glucose when you have pre-diabetes, you may delay or prevent Type 2 diabetes from developing. Stress  If you have diabetes, you may be "diet" controlled or on oral medications or insulin to control your diabetes. However, you  may find that your blood glucose is higher than usual in the hospital whether you have diabetes or not. This is often referred to as "stress hyperglycemia." Stress can elevate your blood glucose. This happens because of hormones put out by the body during times of stress. If stress has been the cause of your high blood glucose, it can be followed regularly by your caregiver. That way he/she can  make sure your hyperglycemia does not continue to get worse or progress to diabetes. Steroids  Steroids are medications that act on the infection fighting system (immune system) to block inflammation or infection. One side effect can be a rise in blood glucose. Most people can produce enough extra insulin to allow for this rise, but for those who cannot, steroids make blood glucose levels go even higher. It is not unusual for steroid treatments to "uncover" diabetes that is developing. It is not always possible to determine if the hyperglycemia will go away after the steroids are stopped. A special blood test called an A1c is sometimes done to determine if your blood glucose was elevated before the steroids were started. SYMPTOMS  Thirsty.  Frequent urination.  Dry mouth.  Blurred vision.  Tired or fatigue.  Weakness.  Sleepy.  Tingling in feet or leg. DIAGNOSIS  Diagnosis is made by monitoring blood glucose in one or all of the following ways:  A1c test. This is a chemical found in your blood.  Fingerstick blood glucose monitoring.  Laboratory results. TREATMENT  First, knowing the cause of the hyperglycemia is important before the hyperglycemia can be treated. Treatment may include, but is not be limited to:  Education.  Change or adjustment in medications.  Change or adjustment in meal plan.  Treatment for an illness, infection, etc.  More frequent blood glucose monitoring.  Change in exercise plan.  Decreasing or stopping steroids.  Lifestyle changes. HOME CARE INSTRUCTIONS   Test your blood glucose as directed.  Exercise regularly. Your caregiver will give you instructions about exercise. Pre-diabetes or diabetes which comes on with stress is helped by exercising.  Eat wholesome, balanced meals. Eat often and at regular, fixed times. Your caregiver or nutritionist will give you a meal plan to guide your sugar intake.  Being at an ideal weight is important.  If needed, losing as little as 10 to 15 pounds may help improve blood glucose levels. SEEK MEDICAL CARE IF:   You have questions about medicine, activity, or diet.  You continue to have symptoms (problems such as increased thirst, urination, or weight gain). SEEK IMMEDIATE MEDICAL CARE IF:   You are vomiting or have diarrhea.  Your breath smells fruity.  You are breathing faster or slower.  You are very sleepy or incoherent.  You have numbness, tingling, or pain in your feet or hands.  You have chest pain.  Your symptoms get worse even though you have been following your caregiver's orders.  If you have any other questions or concerns.   This information is not intended to replace advice given to you by your health care provider. Make sure you discuss any questions you have with your health care provider.   Document Released: 05/07/2001 Document Revised: 02/03/2012 Document Reviewed: 07/18/2015 Elsevier Interactive Patient Education 2016 [place] on coverage for shingles vaccine Step up exercise and reduce sugars and starches. Let's plan follow up lipid panel in 4 months

## 2016-02-07 NOTE — Progress Notes (Signed)
Pre visit review using our clinic review tool, if applicable. No additional management support is needed unless otherwise documented below in the visit note. 

## 2016-02-07 NOTE — Progress Notes (Signed)
Subjective:    Patient ID: Jon Moses, male    DOB: 07-31-1951, 65 y.o.   MRN: [id number]  HPI  here for physical exam.  Hypertension treated with Benicar HCTZ. Recently ran out of medication and just started back couple days ago. Not recently monitoring blood pressures much. No headaches. No dizziness. No chest pains. Recently very sedentary.   Needs tetanus booster. Colonoscopy up-to-date. No history of shingles vaccine. Declines flu vaccination.  Past Medical History  Diagnosis Date  . HYPERTENSION 07/17/2010  . Hyperlipidemia 03/26/2011   Past Surgical History  Procedure Laterality Date  . Inner ear surgery  1975    left  . Tonsillectomy    . Colonoscopy    . Lipoma excision Left 12/24/2013    Procedure: EXCISION MASS LEFT BACK;  Surgeon: Zenovia Jarred, MD;  Location: [facility];  Service: General;  Laterality: Left;    reports that he has never smoked. He does not have any smokeless tobacco history on file. He reports that he drinks alcohol. He reports that he does not use illicit drugs. family history includes Hypertension in his mother and other; Stroke in his father. No Known Allergies    Review of Systems  Constitutional: Negative for fever, activity change, appetite change and fatigue.  HENT: Negative for congestion, ear pain and trouble swallowing.   Eyes: Negative for pain and visual disturbance.  Respiratory: Negative for cough, shortness of breath and wheezing.   Cardiovascular: Negative for chest pain and palpitations.  Gastrointestinal: Negative for nausea, vomiting, abdominal pain, diarrhea, constipation, blood in stool, abdominal distention and rectal pain.  Genitourinary: Negative for dysuria, hematuria and testicular pain.  Musculoskeletal: Negative for joint swelling and arthralgias.  Skin: Negative for rash.  Neurological: Negative for dizziness, syncope and headaches.  Hematological: Negative for adenopathy.    Psychiatric/Behavioral: Negative for confusion and dysphoric mood.       Objective:   Physical Exam  Constitutional: He is oriented to person, place, and time. He appears well-developed and well-nourished. No distress.  HENT:  Head: Normocephalic and atraumatic.  Right Ear: External ear normal.  Left Ear: External ear normal.  Mouth/Throat: Oropharynx is clear and moist.  Eyes: Conjunctivae and EOM are normal. Pupils are equal, round, and reactive to light.  Neck: Normal range of motion. Neck supple. No thyromegaly present.  Cardiovascular: Normal rate, regular rhythm and normal heart sounds.   No murmur heard. Pulmonary/Chest: No respiratory distress. He has no wheezes. He has no rales.  Abdominal: Soft. Bowel sounds are normal. He exhibits no distension and no mass. There is no tenderness. There is no rebound and no guarding.  Musculoskeletal: He exhibits no edema.  Lymphadenopathy:    He has no cervical adenopathy.  Neurological: He is alert and oriented to person, place, and time. He displays normal reflexes. No cranial nerve deficit.  Skin: No rash noted.  Psychiatric: He has a normal mood and affect.          Assessment & Plan:   Physical examination. Tetanus booster given. Check on coverage for shingles vaccine. Blood pressure was somewhat up today but he has not been regularly on medication until couple days ago. Monitor closely at home and be in touch if consistently greater than 140/90. 10 year calculated risk of CAD event 21%. We strongly advocated daily aspirin and also strongly suggested statin use but he declines (statin use) at this time. He does agree to making some dietary changes and repeat lipid panel  in 4 months. Also need to get better blood pressure control

## 2016-02-07 NOTE — Addendum Note (Signed)
Addended by: Elio Forget on: 02/07/2016 10:05 AM   Modules accepted: Orders

## 2016-08-26 LAB — HM DIABETES EYE EXAM

## 2016-08-27 ENCOUNTER — Encounter: Payer: Self-pay | Admitting: Family Medicine

## 2016-08-27 ENCOUNTER — Ambulatory Visit (INDEPENDENT_AMBULATORY_CARE_PROVIDER_SITE_OTHER): Payer: BC Managed Care – PPO | Admitting: Family Medicine

## 2016-08-27 VITALS — BP 180/90 | HR 71 | Temp 98.3°F | Ht 67.75 in | Wt 189.0 lb

## 2016-08-27 DIAGNOSIS — I10 Essential (primary) hypertension: Secondary | ICD-10-CM | POA: Diagnosis not present

## 2016-08-27 DIAGNOSIS — H3562 Retinal hemorrhage, left eye: Secondary | ICD-10-CM | POA: Insufficient documentation

## 2016-08-27 DIAGNOSIS — Z23 Encounter for immunization: Secondary | ICD-10-CM

## 2016-08-27 MED ORDER — AMLODIPINE BESYLATE 5 MG PO TABS: 5.0000 mg | ORAL_TABLET | Freq: Every day | ORAL | 3 refills | Status: DC

## 2016-08-27 NOTE — Addendum Note (Signed)
Addended by: Elio Forget on: 08/27/2016 10:40 AM   Modules accepted: Orders

## 2016-08-27 NOTE — Progress Notes (Signed)
Pre visit review using our clinic review tool, if applicable. No additional management support is needed unless otherwise documented below in the visit note. 

## 2016-08-27 NOTE — Progress Notes (Signed)
Subjective:     Patient ID: Jon Moses, male   DOB: [birth year], 65 y.o.   MRN: [id number]  HPI Patient here to evaluate hypertension. Long-standing history of hypertension and recent poor control by some of his home readings. He recently developed some mild blurred vision and went ophthalmologist yesterday and had blood pressure reading 174/106 with left retinal hemorrhage. He remains on Benicar HCTZ and taking regularly. No alcohol use. Nonsmoker. No nonsteroidal use. Denies headaches. No chest pains. No dizziness. No peripheral edema issues. No history of diabetes.  Past Medical History:  Diagnosis Date  . Hyperlipidemia 03/26/2011  . HYPERTENSION 07/17/2010   Past Surgical History:  Procedure Laterality Date  . COLONOSCOPY    . [place]   left  . LIPOMA EXCISION Left 12/24/2013   Procedure: EXCISION MASS LEFT BACK;  Surgeon: Zenovia Jarred, MD;  Location: [facility];  Service: General;  Laterality: Left;  . TONSILLECTOMY      reports that he has never smoked. He does not have any smokeless tobacco history on file. He reports that he drinks alcohol. He reports that he does not use drugs. family history includes Hypertension in his mother and other; Stroke in his father. No Known Allergies   Review of Systems  Constitutional: Negative for fatigue and unexpected weight change.  Eyes: Positive for visual disturbance.  Respiratory: Negative for cough, chest tightness and shortness of breath.   Cardiovascular: Negative for chest pain, palpitations and leg swelling.  Gastrointestinal: Negative for abdominal pain.  Endocrine: Negative for polydipsia and polyuria.  Genitourinary: Negative for dysuria and frequency.  Neurological: Negative for dizziness, syncope, weakness, light-headedness and headaches.       Objective:   Physical Exam  Constitutional: He is oriented to person, place, and time. He appears well-developed and well-nourished.  HENT:   Right Ear: External ear normal.  Left Ear: External ear normal.  Mouth/Throat: Oropharynx is clear and moist.  Eyes: Pupils are equal, round, and reactive to light.  Neck: Neck supple. No thyromegaly present.  Cardiovascular: Normal rate and regular rhythm.   Pulmonary/Chest: Effort normal and breath sounds normal. No respiratory distress. He has no wheezes. He has no rales.  Musculoskeletal: He exhibits no edema.  Neurological: He is alert and oriented to person, place, and time.       Assessment:     #1 hypertension poorly controlled  #2 recent left retinal hemorrhage    Plan:     -Flu vaccine given -Start amlodipine 5 mg once daily and reassess blood pressure in 3-4 weeks -Continue Benicar HCTZ  Eulas Post MD [facility]

## 2016-09-24 ENCOUNTER — Encounter: Payer: Self-pay | Admitting: Family Medicine

## 2016-09-24 ENCOUNTER — Ambulatory Visit (INDEPENDENT_AMBULATORY_CARE_PROVIDER_SITE_OTHER): Payer: BC Managed Care – PPO | Admitting: Family Medicine

## 2016-09-24 VITALS — BP 130/82 | HR 75 | Temp 98.0°F | Ht 67.75 in | Wt 190.9 lb

## 2016-09-24 DIAGNOSIS — I10 Essential (primary) hypertension: Secondary | ICD-10-CM

## 2016-09-24 NOTE — Progress Notes (Signed)
Subjective:     Patient ID: Jon Moses, male   DOB: July 29, 1951, 65 y.o.   MRN: [id number]  HPI Follow-up hypertension. Recent elevated reading of 180/90 with recent retinal hemorrhage. We added amlodipine 5 mg daily to his Benicar HCTZ and he has tolerated well. No side effects. No headaches. No peripheral edema. Blood pressure much improved. Overall less fatigue.  Past Medical History:  Diagnosis Date  . Hyperlipidemia 03/26/2011  . HYPERTENSION 07/17/2010   Past Surgical History:  Procedure Laterality Date  . COLONOSCOPY    . [place]   left  . LIPOMA EXCISION Left 12/24/2013   Procedure: EXCISION MASS LEFT BACK;  Surgeon: Zenovia Jarred, MD;  Location: [place];  Service: General;  Laterality: Left;  . TONSILLECTOMY      reports that he has never smoked. He does not have any smokeless tobacco history on file. He reports that he drinks alcohol. He reports that he does not use drugs. family history includes Hypertension in his mother and other; Stroke in his father. No Known Allergies   Review of Systems  Constitutional: Negative for fatigue.  Eyes: Negative for visual disturbance.  Respiratory: Negative for cough, chest tightness and shortness of breath.   Cardiovascular: Negative for chest pain, palpitations and leg swelling.  Neurological: Negative for dizziness, syncope, weakness, light-headedness and headaches.       Objective:   Physical Exam  Constitutional: He is oriented to person, place, and time. He appears well-developed and well-nourished.  HENT:  Right Ear: External ear normal.  Left Ear: External ear normal.  Mouth/Throat: Oropharynx is clear and moist.  Eyes: Pupils are equal, round, and reactive to light.  Neck: Neck supple. No thyromegaly present.  Cardiovascular: Normal rate and regular rhythm.   Pulmonary/Chest: Effort normal and breath sounds normal. No respiratory distress. He has no wheezes. He has no rales.   Musculoskeletal: He exhibits no edema.  Neurological: He is alert and oriented to person, place, and time.       Assessment:     Hypertension-greatly improved with addition of amlodipine    Plan:     -Continue current medication regimen -Continue to watch sodium intake closely -Continue regular aerobic activity such as walking -Routine follow-up 6 months  Eulas Post MD [facility] at [facility]

## 2016-12-04 ENCOUNTER — Encounter: Payer: Self-pay | Admitting: Family Medicine

## 2016-12-04 ENCOUNTER — Ambulatory Visit (INDEPENDENT_AMBULATORY_CARE_PROVIDER_SITE_OTHER): Payer: BC Managed Care – PPO | Admitting: Family Medicine

## 2016-12-04 VITALS — BP 120/68 | HR 95 | Temp 97.8°F | Ht 67.75 in | Wt 194.0 lb

## 2016-12-04 DIAGNOSIS — H938X1 Other specified disorders of right ear: Secondary | ICD-10-CM | POA: Diagnosis not present

## 2016-12-04 NOTE — Patient Instructions (Signed)
Barotitis Media Barotitis media is inflammation of your middle ear. This occurs when the auditory tube (eustachian tube) leading from the back of your nose (nasopharynx) to your eardrum is blocked. This blockage may result from a cold, environmental allergies, or an upper respiratory infection. Unresolved barotitis media may lead to damage or hearing loss (barotrauma), which may become permanent. HOME CARE INSTRUCTIONS   Use medicines as recommended by your health care provider. Over-the-counter medicines will help unblock the canal and can help during times of air travel.  Do not put anything into your ears to clean or unplug them. Eardrops will not be helpful.  Do not swim, dive, or fly until your health care provider says it is all right to do so. If these activities are necessary, chewing gum with frequent, forceful swallowing may help. It is also helpful to hold your nose and gently blow to pop your ears for equalizing pressure changes. This forces air into the eustachian tube.  Only take over-the-counter or prescription medicines for pain, discomfort, or fever as directed by your health care provider.  A decongestant may be helpful in decongesting the middle ear and make pressure equalization easier. SEEK MEDICAL CARE IF:  You experience a serious form of dizziness in which you feel as if the room is spinning and you feel nauseated (vertigo).  Your symptoms only involve one ear. SEEK IMMEDIATE MEDICAL CARE IF:   You develop a severe headache, dizziness, or severe ear pain.  You have bloody or pus-like drainage from your ears.  You develop a fever.  Your problems do not improve or become worse. MAKE SURE YOU:   Understand these instructions.  Will watch your condition.  Will get help right away if you are not doing well or get worse. This information is not intended to replace advice given to you by your health care provider. Make sure you discuss any questions you have with  your health care provider. Document Released: 11/08/2000 Document Revised: 09/01/2013 Document Reviewed: 06/08/2013 Elsevier Interactive Patient Education  2017 [employer].  Try OTC Flonase or Nasacort.

## 2016-12-04 NOTE — Progress Notes (Signed)
Subjective:     Patient ID: Jon Moses, male   DOB: 1951/07/09, 66 y.o.   MRN: [id number]  HPI Patient seen with 2 week history of right ear fullness. He's had prior surgery on the left ear and chronic hearing loss on the left side. Has not noted any acute hearing changes on the right. Denies any major nasal congestion. No recent flying. No recent swimming. No ear pain. No drainage.  Past Medical History:  Diagnosis Date  . Hyperlipidemia 03/26/2011  . HYPERTENSION 07/17/2010   Past Surgical History:  Procedure Laterality Date  . COLONOSCOPY    . [place]   left  . LIPOMA EXCISION Left 12/24/2013   Procedure: EXCISION MASS LEFT BACK;  Surgeon: Zenovia Jarred, MD;  Location: [place];  Service: General;  Laterality: Left;  . TONSILLECTOMY      reports that he has never smoked. He does not have any smokeless tobacco history on file. He reports that he drinks alcohol. He reports that he does not use drugs. family history includes Hypertension in his mother and other; Stroke in his father. No Known Allergies   Review of Systems  Constitutional: Negative for chills and fever.  HENT: Negative for ear discharge, ear pain, sinus pain and sinus pressure.        Objective:   Physical Exam  Constitutional: He appears well-developed and well-nourished. No distress.  HENT:  Mouth/Throat: Oropharynx is clear and moist.  He has morphologic/structural changes of left eardrum chronic from surgery. Ear canal is very narrow generally. No cerumen. Portion of right eardrum visualized was a gray with no visible effusion. No bulging. No drainage       Assessment:     Right ear fullness. No evidence for cerumen impaction. No evidence for effusion or infection. Question barotitis media     Plan:     -Consider over-the-counter Flonase or Nasacort. -Consider Valsalva maneuver -Follow-up for any pain, hearing loss, or persistent symptoms  Eulas Post  MD [facility] at [facility]

## 2016-12-04 NOTE — Progress Notes (Signed)
Pre visit review using our clinic review tool, if applicable. No additional management support is needed unless otherwise documented below in the visit note. 

## 2016-12-23 ENCOUNTER — Ambulatory Visit (INDEPENDENT_AMBULATORY_CARE_PROVIDER_SITE_OTHER): Payer: BC Managed Care – PPO | Admitting: Family Medicine

## 2016-12-23 ENCOUNTER — Encounter: Payer: Self-pay | Admitting: Family Medicine

## 2016-12-23 VITALS — BP 140/88 | HR 75 | Temp 97.9°F | Ht 67.5 in | Wt 194.4 lb

## 2016-12-23 DIAGNOSIS — Z0000 Encounter for general adult medical examination without abnormal findings: Secondary | ICD-10-CM

## 2016-12-23 LAB — CBC WITH DIFFERENTIAL/PLATELET
Basophils Absolute: 0 K/uL (ref 0.0–0.1)
Basophils Relative: 1.2 % (ref 0.0–3.0)
Eosinophils Absolute: 0.1 K/uL (ref 0.0–0.7)
Eosinophils Relative: 2.5 % (ref 0.0–5.0)
HCT: 40.9 % (ref 39.0–52.0)
Hemoglobin: 14 g/dL (ref 13.0–17.0)
Lymphocytes Relative: 51.5 % — ABNORMAL HIGH (ref 12.0–46.0)
Lymphs Abs: 2 K/uL (ref 0.7–4.0)
MCHC: 34.3 g/dL (ref 30.0–36.0)
MCV: 88.8 fl (ref 78.0–100.0)
Monocytes Absolute: 0.3 K/uL (ref 0.1–1.0)
Monocytes Relative: 7.3 % (ref 3.0–12.0)
Neutro Abs: 1.5 K/uL (ref 1.4–7.7)
Neutrophils Relative %: 37.5 % — ABNORMAL LOW (ref 43.0–77.0)
Platelets: 272 K/uL (ref 150.0–400.0)
RBC: 4.61 Mil/uL (ref 4.22–5.81)
RDW: 13.4 % (ref 11.5–15.5)
WBC: 3.9 K/uL — ABNORMAL LOW (ref 4.0–10.5)

## 2016-12-23 LAB — HEPATIC FUNCTION PANEL
ALT: 24 U/L (ref 0–53)
AST: 15 U/L (ref 0–37)
Albumin: 4.4 g/dL (ref 3.5–5.2)
Alkaline Phosphatase: 59 U/L (ref 39–117)
Bilirubin, Direct: 0.1 mg/dL (ref 0.0–0.3)
Total Bilirubin: 0.5 mg/dL (ref 0.2–1.2)
Total Protein: 7.2 g/dL (ref 6.0–8.3)

## 2016-12-23 LAB — LIPID PANEL
Cholesterol: 219 mg/dL — ABNORMAL HIGH (ref 0–200)
HDL: 42.5 mg/dL
LDL Cholesterol: 154 mg/dL — ABNORMAL HIGH (ref 0–99)
NonHDL: 176.87
Total CHOL/HDL Ratio: 5
Triglycerides: 112 mg/dL (ref 0.0–149.0)
VLDL: 22.4 mg/dL (ref 0.0–40.0)

## 2016-12-23 LAB — BASIC METABOLIC PANEL WITH GFR
BUN: 15 mg/dL (ref 6–23)
CO2: 29 meq/L (ref 19–32)
Calcium: 9.7 mg/dL (ref 8.4–10.5)
Chloride: 98 meq/L (ref 96–112)
Creatinine, Ser: 1.14 mg/dL (ref 0.40–1.50)
GFR: 83.14 mL/min
Glucose, Bld: 124 mg/dL — ABNORMAL HIGH (ref 70–99)
Potassium: 4.3 meq/L (ref 3.5–5.1)
Sodium: 134 meq/L — ABNORMAL LOW (ref 135–145)

## 2016-12-23 LAB — HEMOGLOBIN A1C: HEMOGLOBIN A1C: 7.1 % — AB (ref 4.6–6.5)

## 2016-12-23 LAB — TSH: TSH: 1.16 u[IU]/mL (ref 0.35–4.50)

## 2016-12-23 LAB — PSA: PSA: 1.67 ng/mL (ref 0.10–4.00)

## 2016-12-23 NOTE — Patient Instructions (Addendum)
Check on coverage for shingles vaccine and let us know if interested.   Monitor blood pressure and be in touch if consistently > 140/90

## 2016-12-23 NOTE — Progress Notes (Signed)
Pre visit review using our clinic review tool, if applicable. No additional management support is needed unless otherwise documented below in the visit note. 

## 2016-12-23 NOTE — Progress Notes (Signed)
Subjective:     Patient ID: Jon Moses, male   DOB: Jan 20, 1951, 66 y.o.   MRN: [id number]  HPI Patient seen for physical exam. History of hyperlipidemia. He thinks he may have skipped his amlodipine last night. Generally, his blood pressure has been below 140/90 at home. He stays very active with his job but somewhat inconsistent with exercise. Has not had shingles vaccine. Other immunizations up-to-date. Colonoscopy up-to-date.  Past Medical History:  Diagnosis Date  . Hyperlipidemia 03/26/2011  . HYPERTENSION 07/17/2010   Past Surgical History:  Procedure Laterality Date  . COLONOSCOPY    . [place]   left  . LIPOMA EXCISION Left 12/24/2013   Procedure: EXCISION MASS LEFT BACK;  Surgeon: Zenovia Jarred, MD;  Location: [place];  Service: General;  Laterality: Left;  . TONSILLECTOMY      reports that he has never smoked. He has never used smokeless tobacco. He reports that he drinks alcohol. He reports that he does not use drugs. family history includes Hypertension in his mother and other; Stroke in his father. No Known Allergies   Review of Systems  Constitutional: Negative for activity change, appetite change, fatigue, fever and unexpected weight change.  HENT: Negative for congestion, ear pain and trouble swallowing.   Eyes: Negative for pain and visual disturbance.  Respiratory: Negative for cough, shortness of breath and wheezing.   Cardiovascular: Negative for chest pain and palpitations.  Gastrointestinal: Negative for abdominal distention, abdominal pain, blood in stool, constipation, diarrhea, nausea, rectal pain and vomiting.  Endocrine: Negative for polydipsia and polyuria.  Genitourinary: Negative for dysuria, hematuria and testicular pain.  Musculoskeletal: Negative for arthralgias and joint swelling.  Skin: Negative for rash.  Neurological: Negative for dizziness, syncope and headaches.  Hematological: Negative for adenopathy.   Psychiatric/Behavioral: Negative for confusion and dysphoric mood.       Objective:   Physical Exam  Constitutional: He is oriented to person, place, and time. He appears well-developed and well-nourished. No distress.  HENT:  Head: Normocephalic and atraumatic.  Right Ear: External ear normal.  Left Ear: External ear normal.  Mouth/Throat: Oropharynx is clear and moist.  Eyes: Conjunctivae and EOM are normal. Pupils are equal, round, and reactive to light.  Neck: Normal range of motion. Neck supple. No thyromegaly present.  Cardiovascular: Normal rate, regular rhythm and normal heart sounds.   No murmur heard. Pulmonary/Chest: No respiratory distress. He has no wheezes. He has no rales.  Abdominal: Soft. Bowel sounds are normal. He exhibits no distension and no mass. There is no tenderness. There is no rebound and no guarding.  Musculoskeletal: He exhibits no edema.  Lymphadenopathy:    He has no cervical adenopathy.  Neurological: He is alert and oriented to person, place, and time. He displays normal reflexes. No cranial nerve deficit.  Skin: No rash noted.  Psychiatric: He has a normal mood and affect.       Assessment:     Physical exam. Patient has hypertension with slightly elevated reading today but generally well-controlled. As above, he may have skipped dose of amlodipine last night    Plan:     -Check labs today. Include hepatitis C antibody. He's had history of mildly elevated glucose in the past so will add A1c. -The natural history of prostate cancer and ongoing controversy regarding screening and potential treatment outcomes of prostate cancer has been discussed with the patient. The meaning of a false positive PSA and a false negative  PSA has been discussed. He indicates understanding of the limitations of this screening test and wishes to proceed with screening PSA testing. -He is encouraged to check on coverage for shingles vaccine and let us know if   interested -Will need Prevnar 13 at age 82  Eulas Post MD [facility] at [facility]

## 2016-12-24 ENCOUNTER — Other Ambulatory Visit: Payer: Self-pay

## 2016-12-24 DIAGNOSIS — E785 Hyperlipidemia, unspecified: Secondary | ICD-10-CM

## 2016-12-24 LAB — HEPATITIS C ANTIBODY: HCV Ab: NEGATIVE

## 2016-12-24 MED ORDER — ATORVASTATIN CALCIUM 20 MG PO TABS: 20.0000 mg | ORAL_TABLET | Freq: Every day | ORAL | 5 refills | Status: DC

## 2016-12-29 ENCOUNTER — Other Ambulatory Visit: Payer: Self-pay | Admitting: Family Medicine

## 2017-02-17 ENCOUNTER — Other Ambulatory Visit: Payer: Self-pay | Admitting: Family Medicine

## 2017-03-27 ENCOUNTER — Encounter: Payer: Self-pay | Admitting: Family Medicine

## 2017-03-27 LAB — HM DIABETES EYE EXAM

## 2017-05-21 ENCOUNTER — Other Ambulatory Visit: Payer: Self-pay | Admitting: Family Medicine

## 2017-08-04 ENCOUNTER — Other Ambulatory Visit: Payer: Self-pay | Admitting: Family Medicine

## 2017-09-26 ENCOUNTER — Other Ambulatory Visit: Payer: Self-pay | Admitting: Family Medicine

## 2017-10-30 ENCOUNTER — Other Ambulatory Visit: Payer: Self-pay | Admitting: Family Medicine

## 2017-12-20 ENCOUNTER — Other Ambulatory Visit: Payer: Self-pay | Admitting: Family Medicine

## 2017-12-30 ENCOUNTER — Other Ambulatory Visit: Payer: Self-pay | Admitting: Family Medicine

## 2017-12-30 NOTE — Telephone Encounter (Signed)
Medication sent electronically 

## 2018-01-24 ENCOUNTER — Other Ambulatory Visit: Payer: Self-pay | Admitting: Family Medicine

## 2018-04-26 ENCOUNTER — Other Ambulatory Visit: Payer: Self-pay | Admitting: Family Medicine

## 2018-05-06 ENCOUNTER — Other Ambulatory Visit: Payer: Self-pay | Admitting: Family Medicine

## 2018-06-12 ENCOUNTER — Encounter: Payer: Self-pay | Admitting: Family Medicine

## 2018-06-12 ENCOUNTER — Ambulatory Visit (INDEPENDENT_AMBULATORY_CARE_PROVIDER_SITE_OTHER): Payer: BC Managed Care – PPO | Admitting: Family Medicine

## 2018-06-12 VITALS — BP 120/80 | HR 86 | Temp 98.2°F | Wt 195.8 lb

## 2018-06-12 DIAGNOSIS — M79672 Pain in left foot: Secondary | ICD-10-CM

## 2018-06-12 DIAGNOSIS — I10 Essential (primary) hypertension: Secondary | ICD-10-CM

## 2018-06-12 DIAGNOSIS — N529 Male erectile dysfunction, unspecified: Secondary | ICD-10-CM | POA: Diagnosis not present

## 2018-06-12 MED ORDER — DICLOFENAC SODIUM 1 % TD GEL: 2.0000 g | Freq: Four times a day (QID) | TRANSDERMAL | 2 refills | Status: DC

## 2018-06-12 MED ORDER — SILDENAFIL CITRATE 100 MG PO TABS: 50.0000 mg | ORAL_TABLET | Freq: Every day | ORAL | 11 refills | Status: DC | PRN

## 2018-06-12 NOTE — Patient Instructions (Signed)
Suspect osteoarthritis of foot-   Let's try the topical Diclofenac.

## 2018-06-12 NOTE — Progress Notes (Signed)
  Subjective:     Patient ID: Jon Moses, male   DOB: Feb 10, 1951, 67 y.o.   MRN: [id number]  HPI Patient seen with left foot pain for approximately the last month. Intermittent. He's had gout previously but states this pain is different. He has not had any redness or significant warmth or swelling typical of previous gout. No known injury. Pain is mild to moderate and worse with weightbearing. He's tried some Tylenol and aspirin with mild improvement.  Patient would like to try something for erectile dysfunction. He's taken Cialis in the past but has had issues with cost. He does not recall trying Viagra. Libido normal. No nitroglycerin use. He has hypertension which has been stable on combination therapy with amlodipine and Benicar HCTZ  Past Medical History:  Diagnosis Date  . Hyperlipidemia 03/26/2011  . HYPERTENSION 07/17/2010   Past Surgical History:  Procedure Laterality Date  . COLONOSCOPY    . [place]   left  . LIPOMA EXCISION Left 12/24/2013   Procedure: EXCISION MASS LEFT BACK;  Surgeon: Zenovia Jarred, MD;  Location: [place];  Service: General;  Laterality: Left;  . TONSILLECTOMY      reports that he has never smoked. He has never used smokeless tobacco. He reports that he drinks alcohol. He reports that he does not use drugs. family history includes Hypertension in his mother and other; Stroke in his father. No Known Allergies   Review of Systems  Constitutional: Negative for fatigue.  Eyes: Negative for visual disturbance.  Respiratory: Negative for cough, chest tightness and shortness of breath.   Cardiovascular: Negative for chest pain, palpitations and leg swelling.  Musculoskeletal: Positive for arthralgias.  Neurological: Negative for dizziness, syncope, weakness, light-headedness and headaches.       Objective:   Physical Exam  Constitutional: He is oriented to person, place, and time. He appears well-developed and  well-nourished.  HENT:  Right Ear: External ear normal.  Left Ear: External ear normal.  Mouth/Throat: Oropharynx is clear and moist.  Eyes: Pupils are equal, round, and reactive to light.  Neck: Neck supple. No thyromegaly present.  Cardiovascular: Normal rate and regular rhythm.  Pulmonary/Chest: Effort normal and breath sounds normal. No respiratory distress. He has no wheezes. He has no rales.  Musculoskeletal: He exhibits no edema.  Left foot reveals no edema. No erythema. No warmth. No foot tenderness. He does have fairly significant bony prominence left metatarsophalangeal joint on both feet  Good capillary refill with excellent distal pulses  Neurological: He is alert and oriented to person, place, and time.       Assessment:     #1 left foot pain. Suspect primary osteoarthritis metatarsophalangeal joint  #2 hypertension stable and at goal  #3 erectile dysfunction    Plan:     -Recommend trial of diclofenac 1% gel 2-3 times daily left foot if he can get this covered -Consider x-rays for any progressive or persistent pain -Viagra 100 mg one half to one tablet daily as needed for erectile dysfunction -Continue current blood pressure medications  Eulas Post MD [facility] at [place]

## 2018-06-12 NOTE — Addendum Note (Signed)
Addended by: Eulas Post on: 06/12/2018 04:40 PM   Modules accepted: Level of Service

## 2018-06-15 ENCOUNTER — Telehealth: Payer: Self-pay | Admitting: *Deleted

## 2018-06-15 NOTE — Telephone Encounter (Signed)
Prior auth for Diclofenac 1% gel sent to [email or web address]-key [id number].

## 2018-06-16 ENCOUNTER — Other Ambulatory Visit: Payer: Self-pay | Admitting: *Deleted

## 2018-06-16 NOTE — Telephone Encounter (Signed)
[place]  sildenafil (VIAGRA) 100 MG tablet is not covered by [employer].  Alternatives are Cialis and Levitra generic are covered.  Would patient like a new Rx for the covered medication or pay out of pocket?  CRM created

## 2018-06-16 NOTE — Telephone Encounter (Signed)
Patient called and advised of the below message from [place], [place], patient says he would like a new Rx Cialis sent to his pharmacy.  [facility] [place] - [address] [place], [place] [phone] (Phone) [phone] (Fax)

## 2018-06-17 NOTE — Telephone Encounter (Signed)
Last OV 06/12/2018   Last refilled 10/14/2013 disp 6 with 6 refills   Sent to PCP to advise  FYI Viagra was resent refilled on 06/12/2018

## 2018-06-17 NOTE — Telephone Encounter (Signed)
Decline.  He should not be on Viagra and Cialis.

## 2018-06-19 NOTE — Telephone Encounter (Signed)
Fax received from [place] stating the request was denied as this is approved when a patient is unable to use oral NSAIDS. Message sent to Dr Elease Hashimoto.

## 2018-06-19 NOTE — Telephone Encounter (Signed)
I left a message for the pt to return my call. 

## 2018-06-19 NOTE — Telephone Encounter (Signed)
Please let patient know insurance denied

## 2018-06-19 NOTE — Telephone Encounter (Signed)
Pt called and asked if he can be contacted on Monday

## 2018-06-22 NOTE — Telephone Encounter (Signed)
Left message on machine for patient to return our call.  CRM created 

## 2018-06-24 MED ORDER — TADALAFIL 20 MG PO TABS: ORAL_TABLET | ORAL | 6 refills | Status: DC

## 2018-06-24 NOTE — Telephone Encounter (Signed)
New Rx sent and medication list updated.  Patient is aware that Diclofenac is not covered by his insurance.

## 2018-07-01 ENCOUNTER — Ambulatory Visit (INDEPENDENT_AMBULATORY_CARE_PROVIDER_SITE_OTHER): Payer: BC Managed Care – PPO | Admitting: Family Medicine

## 2018-07-01 ENCOUNTER — Encounter: Payer: Self-pay | Admitting: Family Medicine

## 2018-07-01 VITALS — BP 122/84 | HR 68 | Temp 98.4°F | Ht 68.0 in | Wt 191.1 lb

## 2018-07-01 DIAGNOSIS — R739 Hyperglycemia, unspecified: Secondary | ICD-10-CM

## 2018-07-01 DIAGNOSIS — Z125 Encounter for screening for malignant neoplasm of prostate: Secondary | ICD-10-CM | POA: Diagnosis not present

## 2018-07-01 DIAGNOSIS — Z0000 Encounter for general adult medical examination without abnormal findings: Secondary | ICD-10-CM

## 2018-07-01 DIAGNOSIS — Z23 Encounter for immunization: Secondary | ICD-10-CM

## 2018-07-01 LAB — CBC WITH DIFFERENTIAL/PLATELET
BASOS ABS: 0 10*3/uL (ref 0.0–0.1)
Basophils Relative: 1 % (ref 0.0–3.0)
Eosinophils Absolute: 0.1 10*3/uL (ref 0.0–0.7)
Eosinophils Relative: 2.6 % (ref 0.0–5.0)
HCT: 41.8 % (ref 39.0–52.0)
Hemoglobin: 14.5 g/dL (ref 13.0–17.0)
LYMPHS ABS: 1.5 10*3/uL (ref 0.7–4.0)
Lymphocytes Relative: 47.9 % — ABNORMAL HIGH (ref 12.0–46.0)
MCHC: 34.8 g/dL (ref 30.0–36.0)
MCV: 88.5 fl (ref 78.0–100.0)
MONO ABS: 0.3 10*3/uL (ref 0.1–1.0)
Monocytes Relative: 7.9 % (ref 3.0–12.0)
NEUTROS PCT: 40.6 % — AB (ref 43.0–77.0)
Neutro Abs: 1.3 10*3/uL — ABNORMAL LOW (ref 1.4–7.7)
Platelets: 234 10*3/uL (ref 150.0–400.0)
RBC: 4.72 Mil/uL (ref 4.22–5.81)
RDW: 13.5 % (ref 11.5–15.5)
WBC: 3.2 10*3/uL — ABNORMAL LOW (ref 4.0–10.5)

## 2018-07-01 LAB — HEPATIC FUNCTION PANEL
ALBUMIN: 4.6 g/dL (ref 3.5–5.2)
ALK PHOS: 69 U/L (ref 39–117)
ALT: 34 U/L (ref 0–53)
AST: 15 U/L (ref 0–37)
Bilirubin, Direct: 0.2 mg/dL (ref 0.0–0.3)
TOTAL PROTEIN: 7.4 g/dL (ref 6.0–8.3)
Total Bilirubin: 0.9 mg/dL (ref 0.2–1.2)

## 2018-07-01 LAB — PSA: PSA: 2.16 ng/mL (ref 0.10–4.00)

## 2018-07-01 LAB — LIPID PANEL
CHOL/HDL RATIO: 3
Cholesterol: 138 mg/dL (ref 0–200)
HDL: 42.1 mg/dL (ref >39.00)
LDL Cholesterol: 78 mg/dL (ref 0–99)
NONHDL: 95.97
Triglycerides: 88 mg/dL (ref 0.0–149.0)
VLDL: 17.6 mg/dL (ref 0.0–40.0)

## 2018-07-01 LAB — BASIC METABOLIC PANEL
BUN: 10 mg/dL (ref 6–23)
CO2: 33 meq/L — AB (ref 19–32)
Calcium: 9.9 mg/dL (ref 8.4–10.5)
Chloride: 96 mEq/L (ref 96–112)
Creatinine, Ser: 1.19 mg/dL (ref 0.40–1.50)
GFR: 78.74 mL/min (ref >60.00)
GLUCOSE: 176 mg/dL — AB (ref 70–99)
POTASSIUM: 4.5 meq/L (ref 3.5–5.1)
SODIUM: 135 meq/L (ref 135–145)

## 2018-07-01 LAB — HEMOGLOBIN A1C: HEMOGLOBIN A1C: 9 % — AB (ref 4.6–6.5)

## 2018-07-01 LAB — TSH: TSH: 0.76 u[IU]/mL (ref 0.35–4.50)

## 2018-07-01 NOTE — Patient Instructions (Signed)
Remember Flu vaccine this Fall  Consider new shingles vaccine (Shingrix). Check on insurance coverage if interested.  We will call you with labs done today.

## 2018-07-01 NOTE — Progress Notes (Signed)
  Subjective:     Patient ID: Jon Moses, male   DOB: 05-23-1951, 67 y.o.   MRN: [id number]  HPI Patient seen for physical exam. He has chronic problems including hypertension, prediabetes, hyperlipidemia. He went on Lipitor last year but has not had follow-up lipids since then. He had A1c 7.1% last year. He has lost a few pounds since then. Tries to watch sugars and starches  Just turned 66. Needs Prevnar 13. No history of shingles vaccine.  Patient had cholesteatoma left ear 1970s with surgery. Some mild chronic vestibular changes since then  Past Medical History:  Diagnosis Date  . Hyperlipidemia 03/26/2011  . HYPERTENSION 07/17/2010   Past Surgical History:  Procedure Laterality Date  . COLONOSCOPY    . [place]   left  . LIPOMA EXCISION Left 12/24/2013   Procedure: EXCISION MASS LEFT BACK;  Surgeon: Zenovia Jarred, MD;  Location: [place];  Service: General;  Laterality: Left;  . TONSILLECTOMY      reports that he has never smoked. He has never used smokeless tobacco. He reports that he drinks alcohol. He reports that he does not use drugs. family history includes Hypertension in his mother and other; Stroke in his father. No Known Allergies   Review of Systems  Constitutional: Negative for fatigue and unexpected weight change.  Eyes: Negative for visual disturbance.  Respiratory: Negative for cough, chest tightness and shortness of breath.   Cardiovascular: Negative for chest pain, palpitations and leg swelling.  Endocrine: Negative for polydipsia and polyuria.  Neurological: Negative for dizziness, syncope, weakness, light-headedness and headaches.       Objective:   Physical Exam  Constitutional: He is oriented to person, place, and time. He appears well-developed and well-nourished. No distress.  HENT:  Head: Normocephalic and atraumatic.  Mouth/Throat: Oropharynx is clear and moist.  Very distorted left eardrum secondary to  prior surgery. He has moderate cerumen right canal partially obscuring eardrum  Eyes: Pupils are equal, round, and reactive to light. Conjunctivae and EOM are normal.  Neck: Normal range of motion. Neck supple. No thyromegaly present.  Cardiovascular: Normal rate, regular rhythm and normal heart sounds.  No murmur heard. Pulmonary/Chest: No respiratory distress. He has no wheezes. He has no rales.  Abdominal: Soft. Bowel sounds are normal. He exhibits no distension and no mass. There is no tenderness. There is no rebound and no guarding.  Musculoskeletal: He exhibits no edema.  Lymphadenopathy:    He has no cervical adenopathy.  Neurological: He is alert and oriented to person, place, and time. He displays normal reflexes. No cranial nerve deficit.  Skin: No rash noted.  Psychiatric: He has a normal mood and affect.       Assessment:     Physical exam. The following issues were addressed    Plan:     -Reminder for flu vaccine this fall -Prevnar 13 given -Obtain lab work including A1c.  Discussed pros and cons of PSA testing and he wishes to proceed -He will check on insurance coverage for new shingles vaccine  Eulas Post MD [facility] at [facility]

## 2018-07-03 ENCOUNTER — Ambulatory Visit: Payer: Self-pay

## 2018-07-03 MED ORDER — METFORMIN HCL 500 MG PO TABS: 500.0000 mg | ORAL_TABLET | Freq: Two times a day (BID) | ORAL | 3 refills | Status: DC

## 2018-07-03 NOTE — Telephone Encounter (Signed)
Patient returned call for lab results.  See result note of 07/03/18. Per PCP recommendation, will send Rx for Metformin to pharmacy.  Patient verbalize understanding.    Reason for Disposition . Health Information question, no triage required and triager able to answer question  Answer Assessment - Initial Assessment Questions 1. REASON FOR CALL or QUESTION: "What is your reason for calling today?" or "How can I best help you?" or "What question do you have that I can help answer?"     Lab results  Protocols used: INFORMATION ONLY CALL-A-AH

## 2018-07-30 ENCOUNTER — Other Ambulatory Visit: Payer: Self-pay | Admitting: *Deleted

## 2018-07-30 NOTE — Telephone Encounter (Signed)
OK to try Sildenafil 100 mg one half to one tablet daily as needed for erectile dysfunction #6 with 11 refills.

## 2018-07-30 NOTE — Telephone Encounter (Signed)
tadalafil (ADCIRCA/CIALIS) 20 MG tablet Is not covered by insurance. The alternative are: Sildenafil tablet Vardenafil tablet  Last office visit 07/03/18 - CPE  Please advise.

## 2018-07-30 NOTE — Telephone Encounter (Signed)
Left message on machine for patient to return our call CRM 

## 2018-08-03 LAB — HM DIABETES EYE EXAM

## 2018-08-06 NOTE — Telephone Encounter (Signed)
Attempted to call the pt but no VM.

## 2018-08-09 ENCOUNTER — Other Ambulatory Visit: Payer: Self-pay | Admitting: Family Medicine

## 2018-09-28 ENCOUNTER — Ambulatory Visit: Payer: BC Managed Care – PPO | Admitting: Family Medicine

## 2018-11-04 ENCOUNTER — Other Ambulatory Visit: Payer: Self-pay | Admitting: Family Medicine

## 2018-12-01 ENCOUNTER — Other Ambulatory Visit: Payer: Self-pay | Admitting: Family Medicine

## 2019-01-01 ENCOUNTER — Other Ambulatory Visit: Payer: Self-pay

## 2019-01-01 ENCOUNTER — Encounter: Payer: Self-pay | Admitting: Family Medicine

## 2019-01-01 ENCOUNTER — Ambulatory Visit (INDEPENDENT_AMBULATORY_CARE_PROVIDER_SITE_OTHER): Payer: Medicare Other | Admitting: Family Medicine

## 2019-01-01 VITALS — BP 112/70 | HR 74 | Temp 97.8°F | Ht 68.0 in | Wt 191.6 lb

## 2019-01-01 DIAGNOSIS — R739 Hyperglycemia, unspecified: Secondary | ICD-10-CM

## 2019-01-01 DIAGNOSIS — Z23 Encounter for immunization: Secondary | ICD-10-CM

## 2019-01-01 DIAGNOSIS — I10 Essential (primary) hypertension: Secondary | ICD-10-CM

## 2019-01-01 DIAGNOSIS — E1165 Type 2 diabetes mellitus with hyperglycemia: Secondary | ICD-10-CM

## 2019-01-01 LAB — POCT GLYCOSYLATED HEMOGLOBIN (HGB A1C): Hemoglobin A1C: 6.5 % — AB (ref 4.0–5.6)

## 2019-01-01 NOTE — Progress Notes (Signed)
  Subjective:     Patient ID: Jon Moses, male   DOB: 02/12/1951, 68 y.o.   MRN: [id number]  HPI Patient seen for follow-up regarding type 2 diabetes.  He was seen here in August and had A1c of 9.0%.  Started metformin 500 mg twice daily.  Since that time, he has made some dietary changes with eliminating sodas and tea and he also has retired.   He thinks reducing stress has help blood sugar control.  Has not started any formal exercise yet.  Feels much better overall.  No polyuria or polydipsia.  Weight is unchanged.  Tolerating metformin without difficulty  Past Medical History:  Diagnosis Date  . Hyperlipidemia 03/26/2011  . HYPERTENSION 07/17/2010   Past Surgical History:  Procedure Laterality Date  . COLONOSCOPY    . [place]   left  . LIPOMA EXCISION Left 12/24/2013   Procedure: EXCISION MASS LEFT BACK;  Surgeon: Jon Jarred, MD;  Location: [place];  Service: General;  Laterality: Left;  . TONSILLECTOMY      reports that he has never smoked. He has never used smokeless tobacco. He reports current alcohol use. He reports that he does not use drugs. family history includes Hypertension in his mother and another family member; Stroke in his father. No Known Allergies   Review of Systems  Constitutional: Negative for fatigue.  Eyes: Negative for visual disturbance.  Respiratory: Negative for cough, chest tightness and shortness of breath.   Cardiovascular: Negative for chest pain, palpitations and leg swelling.  Endocrine: Negative for polydipsia and polyuria.  Neurological: Negative for dizziness, syncope, weakness, light-headedness and headaches.       Objective:   Physical Exam Constitutional:      Appearance: He is well-developed.  HENT:     Right Ear: External ear normal.     Left Ear: External ear normal.  Eyes:     Pupils: Pupils are equal, round, and reactive to light.  Neck:     Musculoskeletal: Neck supple.     Thyroid:  No thyromegaly.  Cardiovascular:     Rate and Rhythm: Normal rate and regular rhythm.  Pulmonary:     Effort: Pulmonary effort is normal. No respiratory distress.     Breath sounds: Normal breath sounds. No wheezing or rales.  Neurological:     Mental Status: He is alert and oriented to person, place, and time.        Assessment:     #1 type 2 diabetes greatly improved with A1c reduction from 9 to 6.5  #2 hypertension stable and at goal    Plan:     -Establish more consistent exercise and continue low glycemic diet -We will plan 70-month follow-up and sooner as needed  Jon Post MD [facility] at [facility]

## 2019-01-01 NOTE — Patient Instructions (Signed)
Keep up low glycemic diet  Establish more consistent exercise  Lets plan on 6 month follow up.  Your A1C was 6.5- good job!!

## 2019-01-04 DIAGNOSIS — H6123 Impacted cerumen, bilateral: Secondary | ICD-10-CM | POA: Diagnosis not present

## 2019-01-04 DIAGNOSIS — Z6828 Body mass index (BMI) 28.0-28.9, adult: Secondary | ICD-10-CM | POA: Diagnosis not present

## 2019-01-04 DIAGNOSIS — H7192 Unspecified cholesteatoma, left ear: Secondary | ICD-10-CM | POA: Diagnosis not present

## 2019-01-11 ENCOUNTER — Telehealth: Payer: Self-pay | Admitting: Family Medicine

## 2019-01-11 ENCOUNTER — Other Ambulatory Visit: Payer: Self-pay

## 2019-01-11 MED ORDER — AMLODIPINE BESYLATE 5 MG PO TABS: 5.0000 mg | ORAL_TABLET | Freq: Every day | ORAL | 1 refills | Status: DC

## 2019-01-11 MED ORDER — ATORVASTATIN CALCIUM 20 MG PO TABS: 20.0000 mg | ORAL_TABLET | Freq: Every day | ORAL | 1 refills | Status: DC

## 2019-01-11 MED ORDER — OLMESARTAN MEDOXOMIL-HCTZ 40-12.5 MG PO TABS: 1.0000 | ORAL_TABLET | Freq: Every day | ORAL | 1 refills | Status: DC

## 2019-01-11 MED ORDER — METFORMIN HCL 500 MG PO TABS: ORAL_TABLET | ORAL | 1 refills | Status: DC

## 2019-01-11 NOTE — Telephone Encounter (Signed)
Copied from [place] [id number]. Topic: Quick Communication - Rx Refill/Question >> Jan 11, 2019  9:01 AM Reyne Dumas L wrote: Medication:  metFORMIN (GLUCOPHAGE) 500 MG tablet amLODipine (NORVASC) 5 MG tablet atorvastatin (LIPITOR) 20 MG tablet olmesartan-hydrochlorothiazide (BENICAR HCT) 40-12.5 MG tablet  Has the patient contacted their pharmacy? Yes - not at new pharmacy (Agent: If no, request that the patient contact the pharmacy for the refill.) (Agent: If yes, when and what did the pharmacy advise?)  Preferred Pharmacy (with phone number or street name): [id number] - [place], [place] [phone] (Phone) [phone] (Fax)  Agent: Please be advised that RX refills may take up to 3 business days. We ask that you follow-up with your pharmacy.

## 2019-01-11 NOTE — Telephone Encounter (Signed)
Medications have been sent to the pharmacy for the patient.

## 2019-04-22 DIAGNOSIS — M9903 Segmental and somatic dysfunction of lumbar region: Secondary | ICD-10-CM | POA: Diagnosis not present

## 2019-04-22 DIAGNOSIS — M9905 Segmental and somatic dysfunction of pelvic region: Secondary | ICD-10-CM | POA: Diagnosis not present

## 2019-04-22 DIAGNOSIS — M9904 Segmental and somatic dysfunction of sacral region: Secondary | ICD-10-CM | POA: Diagnosis not present

## 2019-04-22 DIAGNOSIS — S335XXA Sprain of ligaments of lumbar spine, initial encounter: Secondary | ICD-10-CM | POA: Diagnosis not present

## 2019-07-16 ENCOUNTER — Other Ambulatory Visit: Payer: Self-pay | Admitting: Family Medicine

## 2019-08-12 ENCOUNTER — Other Ambulatory Visit: Payer: Self-pay | Admitting: Family Medicine

## 2019-09-09 ENCOUNTER — Other Ambulatory Visit: Payer: Self-pay | Admitting: Family Medicine

## 2019-10-01 ENCOUNTER — Other Ambulatory Visit: Payer: Self-pay | Admitting: Family Medicine

## 2019-10-10 ENCOUNTER — Other Ambulatory Visit: Payer: Self-pay | Admitting: Family Medicine

## 2019-10-15 ENCOUNTER — Other Ambulatory Visit: Payer: Self-pay | Admitting: Family Medicine

## 2019-11-16 ENCOUNTER — Other Ambulatory Visit: Payer: Self-pay | Admitting: Family Medicine

## 2019-12-17 ENCOUNTER — Other Ambulatory Visit: Payer: Self-pay | Admitting: Family Medicine

## 2019-12-24 ENCOUNTER — Other Ambulatory Visit: Payer: Self-pay

## 2019-12-24 ENCOUNTER — Ambulatory Visit (INDEPENDENT_AMBULATORY_CARE_PROVIDER_SITE_OTHER): Payer: Medicare Other | Admitting: Family Medicine

## 2019-12-24 ENCOUNTER — Encounter: Payer: Self-pay | Admitting: Gastroenterology

## 2019-12-24 ENCOUNTER — Encounter: Payer: Self-pay | Admitting: Family Medicine

## 2019-12-24 VITALS — BP 134/78 | HR 73 | Temp 98.0°F | Ht 67.5 in | Wt 185.8 lb

## 2019-12-24 DIAGNOSIS — Z0000 Encounter for general adult medical examination without abnormal findings: Secondary | ICD-10-CM

## 2019-12-24 DIAGNOSIS — H6121 Impacted cerumen, right ear: Secondary | ICD-10-CM

## 2019-12-24 LAB — HEPATIC FUNCTION PANEL
ALT: 18 U/L (ref 0–53)
AST: 13 U/L (ref 0–37)
Albumin: 4.6 g/dL (ref 3.5–5.2)
Alkaline Phosphatase: 54 U/L (ref 39–117)
Bilirubin, Direct: 0.2 mg/dL (ref 0.0–0.3)
Total Bilirubin: 0.9 mg/dL (ref 0.2–1.2)
Total Protein: 7.3 g/dL (ref 6.0–8.3)

## 2019-12-24 LAB — BASIC METABOLIC PANEL
BUN: 17 mg/dL (ref 6–23)
CO2: 29 mEq/L (ref 19–32)
Calcium: 9.6 mg/dL (ref 8.4–10.5)
Chloride: 97 mEq/L (ref 96–112)
Creatinine, Ser: 1.21 mg/dL (ref 0.40–1.50)
GFR: 72.35 mL/min (ref >60.00)
Glucose, Bld: 115 mg/dL — ABNORMAL HIGH (ref 70–99)
Potassium: 4.4 mEq/L (ref 3.5–5.1)
Sodium: 134 mEq/L — ABNORMAL LOW (ref 135–145)

## 2019-12-24 LAB — CBC WITH DIFFERENTIAL/PLATELET
Basophils Absolute: 0 10*3/uL (ref 0.0–0.1)
Basophils Relative: 1.1 % (ref 0.0–3.0)
Eosinophils Absolute: 0.1 10*3/uL (ref 0.0–0.7)
Eosinophils Relative: 2.6 % (ref 0.0–5.0)
HCT: 41.3 % (ref 39.0–52.0)
Hemoglobin: 14.1 g/dL (ref 13.0–17.0)
Lymphocytes Relative: 47.5 % — ABNORMAL HIGH (ref 12.0–46.0)
Lymphs Abs: 1.7 10*3/uL (ref 0.7–4.0)
MCHC: 34.1 g/dL (ref 30.0–36.0)
MCV: 91.3 fl (ref 78.0–100.0)
Monocytes Absolute: 0.3 10*3/uL (ref 0.1–1.0)
Monocytes Relative: 9.1 % (ref 3.0–12.0)
Neutro Abs: 1.4 10*3/uL (ref 1.4–7.7)
Neutrophils Relative %: 39.7 % — ABNORMAL LOW (ref 43.0–77.0)
Platelets: 250 10*3/uL (ref 150.0–400.0)
RBC: 4.52 Mil/uL (ref 4.22–5.81)
RDW: 13.5 % (ref 11.5–15.5)
WBC: 3.6 10*3/uL — ABNORMAL LOW (ref 4.0–10.5)

## 2019-12-24 LAB — TSH: TSH: 0.72 u[IU]/mL (ref 0.35–4.50)

## 2019-12-24 LAB — LIPID PANEL
Cholesterol: 146 mg/dL (ref 0–200)
HDL: 49.1 mg/dL (ref >39.00)
LDL Cholesterol: 84 mg/dL (ref 0–99)
NonHDL: 96.41
Total CHOL/HDL Ratio: 3
Triglycerides: 64 mg/dL (ref 0.0–149.0)
VLDL: 12.8 mg/dL (ref 0.0–40.0)

## 2019-12-24 LAB — HEMOGLOBIN A1C: Hgb A1c MFr Bld: 6.9 % — ABNORMAL HIGH (ref 4.6–6.5)

## 2019-12-24 LAB — PSA: PSA: 1.42 ng/mL (ref 0.10–4.00)

## 2019-12-24 NOTE — Patient Instructions (Signed)

## 2019-12-24 NOTE — Progress Notes (Signed)
Subjective:     Patient ID: Jon Moses, male   DOB: [birth year], 69 y.o.   MRN: [id number]  HPI   Marcos is seen for physical exam.  He retired back at the end of 2019.  He has his first grandchild - a grandson who is 79 months old and they live over in [place].  He has history of hypertension, hyperlipidemia, and type 2 diabetes.  Not monitoring blood sugars regularly.  Last A1c was 6.5%.  He has had recent eye exam.  He needs Pneumovax, flu shot, and Shingrix vaccine but he is looking at trying to get Covid vaccine soon.  He is due for repeat colonoscopy.  Previous hepatitis C antibody negative.  Next tetanus due 2027.  Past Medical History:  Diagnosis Date  . Hyperlipidemia 03/26/2011  . HYPERTENSION 07/17/2010   Past Surgical History:  Procedure Laterality Date  . COLONOSCOPY    . [place]   left  . LIPOMA EXCISION Left 12/24/2013   Procedure: EXCISION MASS LEFT BACK;  Surgeon: Zenovia Jarred, MD;  Location: [facility];  Service: General;  Laterality: Left;  . TONSILLECTOMY      reports that he has never smoked. He has never used smokeless tobacco. He reports current alcohol use. He reports that he does not use drugs. family history includes Hypertension in his mother and another family member; Stroke in his father. No Known Allergies   Review of Systems  Constitutional: Negative for activity change, appetite change, fatigue and fever.  HENT: Negative for congestion, ear pain and trouble swallowing.   Eyes: Negative for pain and visual disturbance.  Respiratory: Negative for cough, shortness of breath and wheezing.   Cardiovascular: Negative for chest pain and palpitations.  Gastrointestinal: Negative for abdominal distention, abdominal pain, blood in stool, constipation, diarrhea, nausea, rectal pain and vomiting.  Endocrine: Negative for polydipsia and polyuria.  Genitourinary: Negative for dysuria, hematuria and testicular  pain.  Musculoskeletal: Negative for arthralgias and joint swelling.  Skin: Negative for rash.  Neurological: Negative for dizziness, syncope and headaches.  Hematological: Negative for adenopathy.  Psychiatric/Behavioral: Negative for confusion and dysphoric mood.       Objective:   Physical Exam Constitutional:      General: He is not in acute distress.    Appearance: He is well-developed.  HENT:     Head: Normocephalic and atraumatic.     Ears:     Comments: Cerumen impaction right canal Eyes:     Conjunctiva/sclera: Conjunctivae normal.     Pupils: Pupils are equal, round, and reactive to light.  Neck:     Thyroid: No thyromegaly.  Cardiovascular:     Rate and Rhythm: Normal rate and regular rhythm.     Heart sounds: Normal heart sounds. No murmur.  Pulmonary:     Effort: No respiratory distress.     Breath sounds: No wheezing or rales.  Abdominal:     General: Bowel sounds are normal. There is no distension.     Palpations: Abdomen is soft. There is no mass.     Tenderness: There is no abdominal tenderness. There is no guarding or rebound.  Musculoskeletal:     Cervical back: Normal range of motion and neck supple.  Lymphadenopathy:     Cervical: No cervical adenopathy.  Skin:    Findings: No rash.  Neurological:     Mental Status: He is alert and oriented to person, place, and time.  Cranial Nerves: No cranial nerve deficit.     Deep Tendon Reflexes: Reflexes normal.        Assessment:     Physical exam.  He has hypertension which is well controlled.  His hyperlipidemia treated with atorvastatin.  Type 2 diabetes and due for follow-up labs at this time.  He does have right cerumen impaction    Plan:     -Discussed risk of right ear irrigation including bleeding, eardrum trauma and patient consents.  This was irrigated and cleaned out by nurse without difficulty.  Patient tolerated well.  -He needs several immunizations including flu vaccine, Pneumovax,  and Shingrix.  However, he is trying to get Covid vaccine and we explained that getting any those other vaccines would delay him getting Covid for about 2 weeks and he wishes to defer at this time  -Obtain follow-up labs.  Include A1c  Set up repeat colonoscopy  -We will plan routine follow-up in 6 months unless indicated otherwise by labs above  Eulas Post MD [facility]

## 2020-01-05 ENCOUNTER — Other Ambulatory Visit: Payer: Self-pay

## 2020-01-05 ENCOUNTER — Other Ambulatory Visit: Payer: Self-pay | Admitting: Family Medicine

## 2020-01-05 ENCOUNTER — Ambulatory Visit (AMBULATORY_SURGERY_CENTER): Payer: Self-pay

## 2020-01-05 VITALS — Temp 97.3°F | Ht 67.5 in | Wt 188.0 lb

## 2020-01-05 DIAGNOSIS — Z1211 Encounter for screening for malignant neoplasm of colon: Secondary | ICD-10-CM

## 2020-01-05 NOTE — Progress Notes (Signed)

## 2020-01-06 ENCOUNTER — Ambulatory Visit (INDEPENDENT_AMBULATORY_CARE_PROVIDER_SITE_OTHER): Payer: BC Managed Care – PPO

## 2020-01-06 ENCOUNTER — Other Ambulatory Visit: Payer: Self-pay | Admitting: Family Medicine

## 2020-01-06 DIAGNOSIS — Z1159 Encounter for screening for other viral diseases: Secondary | ICD-10-CM

## 2020-01-07 LAB — SARS CORONAVIRUS 2 (TAT 6-24 HRS): SARS Coronavirus 2: NEGATIVE

## 2020-01-09 ENCOUNTER — Other Ambulatory Visit: Payer: Self-pay | Admitting: Family Medicine

## 2020-01-11 ENCOUNTER — Other Ambulatory Visit: Payer: Self-pay

## 2020-01-11 ENCOUNTER — Encounter: Payer: Self-pay | Admitting: Gastroenterology

## 2020-01-11 ENCOUNTER — Ambulatory Visit (AMBULATORY_SURGERY_CENTER): Payer: Medicare Other | Admitting: Gastroenterology

## 2020-01-11 VITALS — BP 116/83 | HR 72 | Temp 96.9°F | Resp 15 | Ht 67.5 in | Wt 188.0 lb

## 2020-01-11 DIAGNOSIS — D124 Benign neoplasm of descending colon: Secondary | ICD-10-CM

## 2020-01-11 DIAGNOSIS — D122 Benign neoplasm of ascending colon: Secondary | ICD-10-CM

## 2020-01-11 DIAGNOSIS — Z1211 Encounter for screening for malignant neoplasm of colon: Secondary | ICD-10-CM

## 2020-01-11 DIAGNOSIS — D128 Benign neoplasm of rectum: Secondary | ICD-10-CM

## 2020-01-11 MED ORDER — SODIUM CHLORIDE 0.9 % IV SOLN: 500.0000 mL | Freq: Once | INTRAVENOUS | Status: DC

## 2020-01-11 NOTE — Progress Notes (Signed)
Called to room to assist during endoscopic procedure.  Patient ID and intended procedure confirmed with present staff. Received instructions for my participation in the procedure from the performing physician.  

## 2020-01-11 NOTE — Patient Instructions (Signed)
Information on polyps and hemorrhoids given to you today.  Await pathology results.  Use FiberCon 1 tablet by mouth each day for fiber supplementation.  High fiber diet.  YOU HAD AN ENDOSCOPIC PROCEDURE TODAY AT [facility]:   Refer to the procedure report that was given to you for any specific questions about what was found during the examination.  If the procedure report does not answer your questions, please call your gastroenterologist to clarify.  If you requested that your care partner not be given the details of your procedure findings, then the procedure report has been included in a sealed envelope for you to review at your convenience later.  YOU SHOULD EXPECT: Some feelings of bloating in the abdomen. Passage of more gas than usual.  Walking can help get rid of the air that was put into your GI tract during the procedure and reduce the bloating. If you had a lower endoscopy (such as a colonoscopy or flexible sigmoidoscopy) you may notice spotting of blood in your stool or on the toilet paper. If you underwent a bowel prep for your procedure, you may not have a normal bowel movement for a few days.  Please Note:  You might notice some irritation and congestion in your nose or some drainage.  This is from the oxygen used during your procedure.  There is no need for concern and it should clear up in a day or so.  SYMPTOMS TO REPORT IMMEDIATELY:   Following lower endoscopy (colonoscopy or flexible sigmoidoscopy):  Excessive amounts of blood in the stool  Significant tenderness or worsening of abdominal pains  Swelling of the abdomen that is new, acute  Fever of 100F or higher   For urgent or emergent issues, a gastroenterologist can be reached at any hour by calling [phone].   DIET:  We do recommend a small meal at first, but then you may proceed to your regular diet.  Drink plenty of fluids but you should avoid alcoholic beverages for 24 hours.  ACTIVITY:   You should plan to take it easy for the rest of today and you should NOT DRIVE or use heavy machinery until tomorrow (because of the sedation medicines used during the test).    FOLLOW UP: Our staff will call the number listed on your records 48-72 hours following your procedure to check on you and address any questions or concerns that you may have regarding the information given to you following your procedure. If we do not reach you, we will leave a message.  We will attempt to reach you two times.  During this call, we will ask if you have developed any symptoms of COVID 19. If you develop any symptoms (ie: fever, flu-like symptoms, shortness of breath, cough etc.) before then, please call [phone].  If you test positive for Covid 19 in the 2 weeks post procedure, please call and report this information to [place].    If any biopsies were taken you will be contacted by phone or by letter within the next 1-3 weeks.  Please call us at [phone] if you have not heard about the biopsies in 3 weeks.    SIGNATURES/CONFIDENTIALITY: You and/or your care partner have signed paperwork which will be entered into your electronic medical record.  These signatures attest to the fact that that the information above on your After Visit Summary has been reviewed and is understood.  Full responsibility of the confidentiality of this discharge information lies with you  and/or your care-partner. 

## 2020-01-11 NOTE — Progress Notes (Signed)
Temp JB Vitals CW 

## 2020-01-11 NOTE — Op Note (Signed)
[facility] Patient Name: Jon Moses Procedure Date: 01/11/2020 10:49 AM MRN: [id number] Endoscopist: Justice Britain , MD Age: 69 Referring MD:  Date of Birth: 10-Jul-1951 Gender: Male Account #: [id number] Procedure:                Colonoscopy Indications:              Screening for colorectal malignant neoplasm Medicines:                Monitored Anesthesia Care Procedure:                Pre-Anesthesia Assessment:                           - Prior to the procedure, a History and Physical                            was performed, and patient medications and                            allergies were reviewed. The patient's tolerance of                            previous anesthesia was also reviewed. The risks                            and benefits of the procedure and the sedation                            options and risks were discussed with the patient.                            All questions were answered, and informed consent                            was obtained. Prior Anticoagulants: The patient has                            taken no previous anticoagulant or antiplatelet                            agents. ASA Grade Assessment: II - A patient with                            mild systemic disease. After reviewing the risks                            and benefits, the patient was deemed in                            satisfactory condition to undergo the procedure.                           After obtaining informed consent, the colonoscope  was passed under direct vision. Throughout the                            procedure, the patient's blood pressure, pulse, and                            oxygen saturations were monitored continuously. The                            Colonoscope was introduced through the anus and                            advanced to the 4 cm into the ileum. The                            colonoscopy was  performed without difficulty. The                            patient tolerated the procedure. The quality of the                            bowel preparation was adequate. The terminal ileum,                            ileocecal valve, appendiceal orifice, and rectum                            were photographed. Scope In: 11:06:34 AM Scope Out: 11:24:35 AM Scope Withdrawal Time: 0 hours 13 minutes 22 seconds  Total Procedure Duration: 0 hours 18 minutes 1 second  Findings:                 The digital rectal exam findings include                            hemorrhoids. Pertinent negatives include no                            palpable rectal lesions.                           A moderate amount of semi-liquid stool was found in                            the entire colon, interfering with visualization.                            Lavage of the area was performed using copious                            amounts, resulting in clearance with adequate                            visualization.  The terminal ileum and ileocecal valve appeared                            normal.                           Four sessile polyps were found in the rectum (1),                            descending colon (1) and ascending colon (2). The                            polyps were 2 to 5 mm in size. These polyps were                            removed with a cold snare. Resection and retrieval                            were complete.                           Normal mucosa was found in the entire colon                            otherwise.                           Non-bleeding non-thrombosed internal hemorrhoids                            were found during retroflexion, during perianal                            exam and during digital exam. The hemorrhoids were                            Grade II (internal hemorrhoids that prolapse but                            reduce  spontaneously). Complications:            No immediate complications. Estimated Blood Loss:     Estimated blood loss was minimal. Impression:               - Hemorrhoids found on digital rectal exam.                           - Stool in the entire examined colon.                           - The examined portion of the ileum was normal.                           - Four 2 to 5 mm polyps in the rectum, in the  descending colon and in the ascending colon,                            removed with a cold snare. Resected and retrieved.                           - Normal mucosa in the entire examined colon                            otherwise.                           - Non-bleeding non-thrombosed internal hemorrhoids. Recommendation:           - The patient will be observed post-procedure,                            until all discharge criteria are met.                           - Discharge patient to home.                           - Patient has a contact number available for                            emergencies. The signs and symptoms of potential                            delayed complications were discussed with the                            patient. Return to normal activities tomorrow.                            Written discharge instructions were provided to the                            patient.                           - High fiber diet.                           - Use FiberCon 1 tablet PO daily.                           - Continue present medications.                           - Await pathology results.                           - Repeat colonoscopy in 3/5/7 years for                            surveillance based on pathology  results and total                            amount of adenomatous tissue.                           - The findings and recommendations were discussed                            with the patient. Justice Britain, MD 01/11/2020  11:31:15 AM

## 2020-01-11 NOTE — Progress Notes (Signed)
PT taken to PACU. Monitors in place. VSS. Report given to RN. 

## 2020-01-14 ENCOUNTER — Encounter: Payer: Self-pay | Admitting: Gastroenterology

## 2020-01-14 ENCOUNTER — Telehealth: Payer: Self-pay | Admitting: *Deleted

## 2020-01-14 ENCOUNTER — Telehealth: Payer: Self-pay | Admitting: Family Medicine

## 2020-01-14 NOTE — Telephone Encounter (Signed)
Please find out if there is an alternative in the same class of medication that would be covered at less expense based on his insurance formulary

## 2020-01-14 NOTE — Telephone Encounter (Signed)
I called patient and he stated that his Olmesartan-hydrochlorothiazide (Benicar) 40-12.5 has gone up to over $100 and he stated his insurance stated to call us.  Please advise.

## 2020-01-14 NOTE — Telephone Encounter (Signed)
Pt would a call back in regards to a medication and his insurance.   Patient Phone: [phone]

## 2020-01-14 NOTE — Telephone Encounter (Signed)
  Follow up Call-  Call back number 01/11/2020  Post procedure Call Back phone  # [phone]  Permission to leave phone message Yes  Some recent data might be hidden     Patient questions:  Do you have a fever, pain , or abdominal swelling? No. Pain Score  0 *  Have you tolerated food without any problems? Yes.    Have you been able to return to your normal activities? Yes.    Do you have any questions about your discharge instructions: Diet   No. Medications  No. Follow up visit  No.  Do you have questions or concerns about your Care? No.  Actions: * If pain score is 4 or above: No action needed, pain <4.  1. Have you developed a fever since your procedure? no  2.   Have you had an respiratory symptoms (SOB or cough) since your procedure? no  3.   Have you tested positive for COVID 19 since your procedure no  4.   Have you had any family members/close contacts diagnosed with the COVID 19 since your procedure?  no   If yes to any of these questions please route to Joylene John, RN and Alphonsa Gin, Therapist, sports.

## 2020-01-14 NOTE — Telephone Encounter (Signed)
Called patient and let him know to call his insurance or pharmacy to find out what the alternative is for this medication. Patient verbalized an understanding and stated that he will call us back when he finds out.

## 2020-01-18 ENCOUNTER — Telehealth: Payer: Self-pay | Admitting: Family Medicine

## 2020-01-18 NOTE — Telephone Encounter (Signed)
Jon Moses from a comprehensive company called to give use other alternative medication to use that is more affordable for the patient.  Tier 1  benazepril hydrochlorothiazide 20-12.5 oral tablets lisinopril  Teir 3  olmesartan medoxomil hydrochlorothiazide 40-12.5 tablets  The patient stated that he had already contacted our office and didn't understand why Jon Moses was calling us with him on the line.  I asked for a call back number and a company name from [name] and he stated that he was just calling us to give use different alternatives for the patient.

## 2020-01-18 NOTE — Telephone Encounter (Addendum)
Insurance will not cover the Benicar HCT brand. Pt has called his Insurance and they told him to refer to his PCP. He is needing a different brand called in and he stated that he is down to a few tablets  Medication Refill: Benicar HCT Pharmacy: [facility]: [phone]

## 2020-01-18 NOTE — Telephone Encounter (Signed)
Stop Benicar HCTZ and start Benazepril HCTZ 20/12.5 mg one daily #90 with 3 refills.

## 2020-01-18 NOTE — Telephone Encounter (Signed)
Please advise 

## 2020-01-19 ENCOUNTER — Other Ambulatory Visit: Payer: Self-pay | Admitting: *Deleted

## 2020-01-19 MED ORDER — BENAZEPRIL-HYDROCHLOROTHIAZIDE 20-12.5 MG PO TABS: 1.0000 | ORAL_TABLET | Freq: Every day | ORAL | 3 refills | Status: DC

## 2020-01-19 NOTE — Telephone Encounter (Signed)
Patient has been notified of Rx that insurance will pay for and that it was sent to his pharmacy. Nothing further needed at this time.

## 2020-01-19 NOTE — Telephone Encounter (Signed)
Patient informed and verbalized understanding

## 2020-04-25 ENCOUNTER — Telehealth: Payer: Self-pay | Admitting: *Deleted

## 2020-04-25 NOTE — Telephone Encounter (Signed)
Patient called Nurse Triage 04/21/2020. Patient reports  his BP medication is causing him to have a dry hacking cough. Patient would like to have a different medication without that side effect. Cough onset in last 2-3 weeks ago. No fever. He started benazepril a few months ago. He finished covid vaccines 5 wks ago.

## 2020-04-25 NOTE — Telephone Encounter (Signed)
Pt has been scheduled for appt to discuss

## 2020-04-26 ENCOUNTER — Other Ambulatory Visit: Payer: Self-pay

## 2020-04-26 ENCOUNTER — Telehealth (INDEPENDENT_AMBULATORY_CARE_PROVIDER_SITE_OTHER): Payer: BC Managed Care – PPO | Admitting: Family Medicine

## 2020-04-26 DIAGNOSIS — I10 Essential (primary) hypertension: Secondary | ICD-10-CM | POA: Diagnosis not present

## 2020-04-26 DIAGNOSIS — R058 Other specified cough: Secondary | ICD-10-CM

## 2020-04-26 DIAGNOSIS — R05 Cough: Secondary | ICD-10-CM | POA: Diagnosis not present

## 2020-04-26 MED ORDER — LOSARTAN POTASSIUM-HCTZ 50-12.5 MG PO TABS: 1.0000 | ORAL_TABLET | Freq: Every day | ORAL | 3 refills | Status: DC

## 2020-04-26 NOTE — Progress Notes (Signed)
Patient ID: Jon Moses, male   DOB: 06/05/1951, 69 y.o.   MRN: [id number]  Virtual Visit via Telephone Note  I connected with Jon Moses on 04/26/20 at  3:45 PM EDT by telephone and verified that I am speaking with the correct person using two identifiers.   I discussed the limitations, risks, security and privacy concerns of performing an evaluation and management service by telephone and the availability of in person appointments. I also discussed with the patient that there may be a patient responsible charge related to this service. The patient expressed understanding and agreed to proceed.  Location patient: home Location provider: work or home office Participants present for the call: patient, provider Patient did not have a visit in the prior 7 days to address this/these issue(s).   History of Present Illness: Jon Moses has history of hypertension.  He was doing well on Benicar HCTZ but his insurance company requested that he change.  He was switched to Lotensin HCTZ but developed dry cough which has been daily for several weeks now and worse at night.  No dyspnea.  No fever.  No wheezing.  He had recent A1c 6.9%.  Needs Pneumovax and also would like to get shingles vaccine.  He has already completed Covid vaccinations.   Observations/Objective: Patient sounds cheerful and well on the phone. I do not appreciate any SOB. Speech and thought processing are grossly intact. Patient reported vitals:  Assessment and Plan:  #1 Dry cough related probably to ACE inhibitor with Lotensin -Discontinue Lotensin HCTZ and start losartan HCTZ 50/12.5 mg  -We recommended follow-up within the next couple months to reassess blood pressure in office  #2 type 2 diabetes.  Recent A1c 6.9%.  Recommend in office follow-up within 2 months to recheck A1c  #3 health maintenance -Needs Pneumovax and Shingrix and we will address these at office follow-up in 2 months  Follow Up Instructions:  - as  above in 2 months   [id number] 5-10 [id number] 11-20 [id number] 21-30 I did not refer this patient for an OV in the next 24 hours for this/these issue(s).  I discussed the assessment and treatment plan with the patient. The patient was provided an opportunity to ask questions and all were answered. The patient agreed with the plan and demonstrated an understanding of the instructions.   The patient was advised to call back or seek an in-person evaluation if the symptoms worsen or if the condition fails to improve as anticipated.  I provided 22 minutes of non-face-to-face time during this encounter.   Carolann Littler, MD

## 2020-06-21 ENCOUNTER — Ambulatory Visit: Payer: BC Managed Care – PPO | Admitting: Family Medicine

## 2020-06-23 ENCOUNTER — Ambulatory Visit (INDEPENDENT_AMBULATORY_CARE_PROVIDER_SITE_OTHER): Payer: BC Managed Care – PPO | Admitting: Family Medicine

## 2020-06-23 ENCOUNTER — Other Ambulatory Visit: Payer: Self-pay

## 2020-06-23 ENCOUNTER — Encounter: Payer: Self-pay | Admitting: Family Medicine

## 2020-06-23 VITALS — BP 130/62 | HR 74 | Temp 98.2°F | Wt 182.2 lb

## 2020-06-23 DIAGNOSIS — E1165 Type 2 diabetes mellitus with hyperglycemia: Secondary | ICD-10-CM

## 2020-06-23 LAB — POCT GLYCOSYLATED HEMOGLOBIN (HGB A1C): Hemoglobin A1C: 6.4 % — AB (ref 4.0–5.6)

## 2020-06-23 NOTE — Patient Instructions (Signed)
Keep up the good work!  A1C has improved from 6.9 to 6.4%.  Keep white starch and sugar intake down  Keep exercising.

## 2020-06-23 NOTE — Progress Notes (Signed)
Established Patient Office Visit  Subjective:  Patient ID: Jon Moses, male    DOB: 11/14/1951  Age: 69 y.o. MRN: [id number]  CC:  Chief Complaint  Patient presents with  . Follow-up    pt is here for 6 month follow up on diabetes     HPI Jon Moses presents for follow-up regarding type 2 diabetes.  When he was here for physical back in January had A1c 6.9%.  He has been on Metformin for couple years.  He is exercising some.  He has been a little bit more strict with diet.  His weight is down a couple pounds.  No polyuria or polydipsia  Past Medical History:  Diagnosis Date  . Diabetes mellitus without complication ([place])   . Hyperlipidemia 03/26/2011  . HYPERTENSION 07/17/2010    Past Surgical History:  Procedure Laterality Date  . COLONOSCOPY     11 yrs ago from 2021  . [place]   left  . LIPOMA EXCISION Left 12/24/2013   Procedure: EXCISION MASS LEFT BACK;  Surgeon: Zenovia Jarred, MD;  Location: [facility];  Service: General;  Laterality: Left;  . TONSILLECTOMY      Family History  Problem Relation Age of Onset  . Hypertension Other   . Hypertension Mother   . Stroke Father   . Colon cancer Neg Hx   . Colon polyps Neg Hx   . Esophageal cancer Neg Hx   . Stomach cancer Neg Hx   . Rectal cancer Neg Hx     Social History   Socioeconomic History  . Marital status: Married    Spouse name: Not on file  . Number of children: Not on file  . Years of education: Not on file  . Highest education level: Not on file  Occupational History  . Not on file  Tobacco Use  . Smoking status: Never Smoker  . Smokeless tobacco: Never Used  Vaping Use  . Vaping Use: Never used  Substance and Sexual Activity  . Alcohol use: Yes    Comment: occ  . Drug use: No  . Sexual activity: Not on file  Other Topics Concern  . Not on file  Social History Narrative  . Not on file   Social Determinants of Health   Financial Resource Strain:     . Difficulty of Paying Living Expenses:   Food Insecurity:   . Worried About Charity fundraiser in the Last Year:   . Arboriculturist in the Last Year:   Transportation Needs:   . Film/video editor (Medical):   Marland Kitchen Lack of Transportation (Non-Medical):   Physical Activity:   . Days of Exercise per Week:   . Minutes of Exercise per Session:   Stress:   . Feeling of Stress :   Social Connections:   . Frequency of Communication with Friends and Family:   . Frequency of Social Gatherings with Friends and Family:   . Attends Religious Services:   . Active Member of Clubs or Organizations:   . Attends Archivist Meetings:   Marland Kitchen Marital Status:   Intimate Partner Violence:   . Fear of Current or Ex-Partner:   . Emotionally Abused:   Marland Kitchen Physically Abused:   . Sexually Abused:     Outpatient Medications Prior to Visit  Medication Sig Dispense Refill  . amLODipine (NORVASC) 5 MG tablet TAKE 1 TABLET BY MOUTH EVERY DAY 90 tablet 3  .  atorvastatin (LIPITOR) 20 MG tablet TAKE 1 TABLET BY MOUTH EVERY DAY 90 tablet 3  . losartan-hydrochlorothiazide (HYZAAR) 50-12.5 MG tablet Take 1 tablet by mouth daily. 90 tablet 3  . metFORMIN (GLUCOPHAGE) 500 MG tablet TAKE 1 TABLET(500 MG) BY MOUTH TWICE DAILY WITH A MEAL 180 tablet 1   No facility-administered medications prior to visit.    Allergies  Allergen Reactions  . Ace Inhibitors Cough    ROS Review of Systems  Constitutional: Negative for fatigue.  Eyes: Negative for visual disturbance.  Respiratory: Negative for cough, chest tightness and shortness of breath.   Cardiovascular: Negative for chest pain, palpitations and leg swelling.  Neurological: Negative for dizziness, syncope, weakness, light-headedness and headaches.      Objective:    Physical Exam Vitals reviewed.  Constitutional:      Appearance: Normal appearance.  Cardiovascular:     Rate and Rhythm: Normal rate and regular rhythm.  Pulmonary:      Effort: Pulmonary effort is normal.     Breath sounds: Normal breath sounds.  Musculoskeletal:     Right lower leg: No edema.     Left lower leg: No edema.  Neurological:     Mental Status: He is alert.     BP (!) 130/62 (BP Location: Left Arm, Patient Position: Sitting, Cuff Size: Normal)   Pulse 74   Temp 98.2 F (36.8 C) (Oral)   Wt 182 lb 3.2 oz (82.6 kg)   SpO2 97%   BMI 28.12 kg/m  Wt Readings from Last 3 Encounters:  06/23/20 182 lb 3.2 oz (82.6 kg)  01/11/20 188 lb (85.3 kg)  01/05/20 188 lb (85.3 kg)     Health Maintenance Due  Topic Date Due  . FOOT EXAM  Never done  . COVID-19 Vaccine (1) Never done  . PNA vac Low Risk Adult (2 of 2 - PPSV23) 07/02/2019  . OPHTHALMOLOGY EXAM  06/08/2020    There are no preventive care reminders to display for this patient.  Lab Results  Component Value Date   TSH 0.72 12/24/2019   Lab Results  Component Value Date   WBC 3.6 (L) 12/24/2019   HGB 14.1 12/24/2019   HCT 41.3 12/24/2019   MCV 91.3 12/24/2019   PLT 250.0 12/24/2019   Lab Results  Component Value Date   NA 134 (L) 12/24/2019   K 4.4 12/24/2019   CO2 29 12/24/2019   GLUCOSE 115 (H) 12/24/2019   BUN 17 12/24/2019   CREATININE 1.21 12/24/2019   BILITOT 0.9 12/24/2019   ALKPHOS 54 12/24/2019   AST 13 12/24/2019   ALT 18 12/24/2019   PROT 7.3 12/24/2019   ALBUMIN 4.6 12/24/2019   CALCIUM 9.6 12/24/2019   GFR 72.35 12/24/2019   Lab Results  Component Value Date   CHOL 146 12/24/2019   Lab Results  Component Value Date   HDL 49.10 12/24/2019   Lab Results  Component Value Date   LDLCALC 84 12/24/2019   Lab Results  Component Value Date   TRIG 64.0 12/24/2019   Lab Results  Component Value Date   CHOLHDL 3 12/24/2019   Lab Results  Component Value Date   HGBA1C 6.4 (A) 06/23/2020      Assessment & Plan:   Problem List Items Addressed This Visit      Unprioritized   Type 2 diabetes mellitus with hyperglycemia ([place]) - Primary    Relevant Orders   POCT glycosylated hemoglobin (Hb A1C) (Completed)    Diabetes improved.  Reduction in A1c from 6.9 to 6.4%.  -Continue Metformin -We discussed the importance of regular aerobic and resistance training and continue low glycemic diet -Recheck A1c at follow-up for physical in 6 months  No orders of the defined types were placed in this encounter.   Follow-up: No follow-ups on file.    Carolann Littler, MD

## 2020-07-21 ENCOUNTER — Telehealth: Payer: Self-pay | Admitting: Family Medicine

## 2020-07-21 ENCOUNTER — Ambulatory Visit: Payer: Medicare Other

## 2020-07-21 DIAGNOSIS — Z20822 Contact with and (suspected) exposure to covid-19: Secondary | ICD-10-CM | POA: Diagnosis not present

## 2020-07-21 NOTE — Telephone Encounter (Signed)
Called patient and let him know that he will need to go to the pharmacy for the shingles vaccine since he is over 69 years old. Patient verbalized an understanding.  Also gave patient advise for precautions for Covid and patient will call if he needs anything.

## 2020-07-21 NOTE — Telephone Encounter (Signed)
Pt wants a call because he is confused about where to go get his shingles vaccine. I explained that he should go to the pharmacy because he is over 65 and insurance won't cover it in the office. He said he was uncomfortable going to a pharmacy to have a shot.   Appointment was cancelled today because his son tested positive for COVID.  Please call pt at [phone]  Please advise

## 2020-07-24 ENCOUNTER — Other Ambulatory Visit: Payer: Self-pay | Admitting: Family Medicine

## 2020-08-22 DIAGNOSIS — Z23 Encounter for immunization: Secondary | ICD-10-CM | POA: Diagnosis not present

## 2020-09-14 ENCOUNTER — Telehealth: Payer: Self-pay | Admitting: Family Medicine

## 2020-09-14 NOTE — Telephone Encounter (Signed)
Left message for patient to schedule Annual Wellness Visit.  Please schedule with Nurse Health Advisor Shannon Crews, RN at [facility]  

## 2020-09-22 ENCOUNTER — Ambulatory Visit (INDEPENDENT_AMBULATORY_CARE_PROVIDER_SITE_OTHER): Payer: Medicare Other

## 2020-09-22 ENCOUNTER — Other Ambulatory Visit: Payer: Self-pay

## 2020-09-22 DIAGNOSIS — Z0000 Encounter for general adult medical examination without abnormal findings: Secondary | ICD-10-CM

## 2020-09-22 NOTE — Progress Notes (Signed)
Subjective:   Jon Moses is a 69 y.o. male who presents for an Initial Medicare Annual Wellness Visit.  I connected with Jon Moses today by telephone and verified that I am speaking with the correct person using two identifiers. Location patient: home Location provider: work Persons participating in the virtual visit: patient, provider.   I discussed the limitations, risks, security and privacy concerns of performing an evaluation and management service by telephone and the availability of in person appointments. I also discussed with the patient that there may be a patient responsible charge related to this service. The patient expressed understanding and verbally consented to this telephonic visit.    Interactive audio and video telecommunications were attempted between this provider and patient, however failed, due to patient having technical difficulties OR patient did not have access to video capability.  We continued and completed visit with audio only.      Review of Systems    N/A  Cardiac Risk Factors include: advanced age (>67men, >23 women);hypertension;dyslipidemia;diabetes mellitus;male gender     Objective:    Today's Vitals   There is no height or weight on file to calculate BMI.  Advanced Directives 09/22/2020 09/22/2020 12/21/2013  Does Patient Have a Medical Advance Directive? No No Patient does not have advance directive;Patient would not like information  Would patient like information on creating a medical advance directive? No - Patient declined No - Patient declined -    Current Medications (verified) Outpatient Encounter Medications as of 09/22/2020  Medication Sig  . amLODipine (NORVASC) 5 MG tablet TAKE 1 TABLET BY MOUTH EVERY DAY  . atorvastatin (LIPITOR) 20 MG tablet TAKE 1 TABLET BY MOUTH EVERY DAY  . losartan-hydrochlorothiazide (HYZAAR) 50-12.5 MG tablet Take 1 tablet by mouth daily.  . metFORMIN (GLUCOPHAGE) 500 MG tablet TAKE 1  TABLET(500 MG) BY MOUTH TWICE DAILY WITH A MEAL   No facility-administered encounter medications on file as of 09/22/2020.    Allergies (verified) Ace inhibitors   History: Past Medical History:  Diagnosis Date  . Diabetes mellitus without complication ([place])   . Hyperlipidemia 03/26/2011  . HYPERTENSION 07/17/2010   Past Surgical History:  Procedure Laterality Date  . COLONOSCOPY     11 yrs ago from 2021  . [place]   left  . LIPOMA EXCISION Left 12/24/2013   Procedure: EXCISION MASS LEFT BACK;  Surgeon: Zenovia Jarred, MD;  Location: [facility];  Service: General;  Laterality: Left;  . TONSILLECTOMY     Family History  Problem Relation Age of Onset  . Hypertension Other   . Hypertension Mother   . Stroke Father   . Colon cancer Neg Hx   . Colon polyps Neg Hx   . Esophageal cancer Neg Hx   . Stomach cancer Neg Hx   . Rectal cancer Neg Hx    Social History   Socioeconomic History  . Marital status: Married    Spouse name: Not on file  . Number of children: Not on file  . Years of education: Not on file  . Highest education level: Not on file  Occupational History  . Not on file  Tobacco Use  . Smoking status: Never Smoker  . Smokeless tobacco: Never Used  Vaping Use  . Vaping Use: Never used  Substance and Sexual Activity  . Alcohol use: Yes    Comment: occ  . Drug use: No  . Sexual activity: Not on file  Other Topics Concern  .  Not on file  Social History Narrative  . Not on file   Social Determinants of Health   Financial Resource Strain: Low Risk   . Difficulty of Paying Living Expenses: Not hard at all  Food Insecurity: No Food Insecurity  . Worried About Charity fundraiser in the Last Year: Never true  . Ran Out of Food in the Last Year: Never true  Transportation Needs: No Transportation Needs  . Lack of Transportation (Medical): No  . Lack of Transportation (Non-Medical): No  Physical Activity: Sufficiently  Active  . Days of Exercise per Week: 4 days  . Minutes of Exercise per Session: 90 min  Stress: No Stress Concern Present  . Feeling of Stress : Not at all  Social Connections: Moderately Integrated  . Frequency of Communication with Friends and Family: More than three times a week  . Frequency of Social Gatherings with Friends and Family: More than three times a week  . Attends Religious Services: More than 4 times per year  . Active Member of Clubs or Organizations: No  . Attends Archivist Meetings: Never  . Marital Status: Married    Tobacco Counseling Counseling given: Not Answered   Clinical Intake:  Pre-visit preparation completed: Yes  Pain : No/denies pain     Nutritional Risks: None Diabetes: Yes CBG done?: No Did pt. bring in CBG monitor from home?: No  How often do you need to have someone help you when you read instructions, pamphlets, or other written materials from your doctor or pharmacy?: 1 - Never What is the last grade level you completed in school?: College  Diabetic?Yes Nutrition Risk Assessment:  Has the patient had any N/V/D within the last 2 months?  No  Does the patient have any non-healing wounds?  No  Has the patient had any unintentional weight loss or weight gain?  No   Diabetes:  Is the patient diabetic?  Yes  If diabetic, was a CBG obtained today?  No  Did the patient bring in their glucometer from home?  No  How often do you monitor your CBG's? Patient states does not check blood sugars at home .   Financial Strains and Diabetes Management:  Are you having any financial strains with the device, your supplies or your medication? No .  Does the patient want to be seen by Chronic Care Management for management of their diabetes?  No  Would the patient like to be referred to a Nutritionist or for Diabetic Management?  No   Diabetic Exams:  Diabetic Eye Exam: Overdue for diabetic eye exam. Pt has been advised about the  importance in completing this exam. Patient advised to call and schedule an eye exam. Diabetic Foot Exam: Overdue, Pt has been advised about the importance in completing this exam. Pt is scheduled for diabetic foot exam on next office visit .  Interpreter Needed?: No  Information entered by :: [place] of Daily Living In your present state of health, do you have any difficulty performing the following activities: 09/22/2020  Hearing? N  Vision? N  Difficulty concentrating or making decisions? N  Walking or climbing stairs? N  Dressing or bathing? N  Doing errands, shopping? N  Preparing Food and eating ? N  Using the Toilet? N  In the past six months, have you accidently leaked urine? N  Do you have problems with loss of bowel control? N  Managing your Medications? N  Managing your Finances? N  Housekeeping or managing your Housekeeping? N  Some recent data might be hidden    Patient Care Team: Eulas Post, MD as PCP - General  Indicate any recent Medical Services you may have received from other than [facility] providers in the past year (date may be approximate).     Assessment:   This is a routine wellness examination for [place].  Hearing/Vision screen  Hearing Screening   125Hz  250Hz  500Hz  1000Hz  2000Hz  3000Hz  4000Hz  6000Hz  8000Hz   Right ear:           Left ear:           Vision Screening Comments: Patient states gets eyes checked every year sometimes twice per year.   Dietary issues and exercise activities discussed: Current Exercise Habits: Home exercise routine, Type of exercise: walking (bicycle), Time (Minutes): 60, Frequency (Times/Week): 4, Weekly Exercise (Minutes/Week): 240, Intensity: Moderate, Exercise limited by: None identified  Goals    . Patient Stated     I will continue to walk daily and ride my bike when the weather permits       Depression Screen PHQ 2/9 Scores 09/22/2020 12/24/2019 07/01/2018  PHQ - 2 Score 0 0 0  PHQ- 9 Score  0 0 -    Fall Risk Fall Risk  09/22/2020 12/24/2019 07/01/2018  Falls in the past year? 0 0 No  Number falls in past yr: 0 - -  Injury with Fall? 0 - -  Risk for fall due to : No Fall Risks - -  Follow up Falls evaluation completed;Falls prevention discussed - -    Any stairs in or around the home? Yes  If so, are there any without handrails? No  Home free of loose throw rugs in walkways, pet beds, electrical cords, etc? Yes  Adequate lighting in your home to reduce risk of falls? Yes   ASSISTIVE DEVICES UTILIZED TO PREVENT FALLS:  Life alert? No  Use of a cane, walker or w/c? No  Grab bars in the bathroom? No  Shower chair or bench in shower? No  Elevated toilet seat or a handicapped toilet? No     Cognitive Function:  Cognitive screening not indicated based on direct observation.      Immunizations Immunization History  Administered Date(s) Administered  . Influenza Split 10/29/2011, 09/16/2012  . Influenza Whole 08/07/2010  . Influenza, High Dose Seasonal PF 01/01/2019  . Influenza,inj,Quad PF,6+ Mos 10/14/2013, 08/27/2016  . Moderna SARS-COVID-2 Vaccination 01/17/2020, 02/14/2020  . Pneumococcal Conjugate-13 07/01/2018  . Td 11/26/2003  . Tdap 02/07/2016    TDAP status: Up to date Flu Vaccine status: Up to date Pneumococcal vaccine status: Declined,  Education has been provided regarding the importance of this vaccine but patient still declined. Advised may receive this vaccine at local pharmacy or Health Dept. Aware to provide a copy of the vaccination record if obtained from local pharmacy or Health Dept. Verbalized acceptance and understanding.  Covid-19 vaccine status: Completed vaccines  Qualifies for Shingles Vaccine? Yes   Zostavax completed No   Shingrix Completed?: No.    Education has been provided regarding the importance of this vaccine. Patient has been advised to call insurance company to determine out of pocket expense if they have not yet received  this vaccine. Advised may also receive vaccine at local pharmacy or Health Dept. Verbalized acceptance and understanding.  Screening Tests Health Maintenance  Topic Date Due  . FOOT EXAM  Never done  . PNA vac Low Risk Adult (2 of 2 -  PPSV23) 07/02/2019  . OPHTHALMOLOGY EXAM  06/08/2020  . INFLUENZA VACCINE  06/25/2020  . HEMOGLOBIN A1C  12/24/2020  . TETANUS/TDAP  02/06/2026  . COLONOSCOPY  01/10/2027  . COVID-19 Vaccine  Completed  . Hepatitis C Screening  Completed    Health Maintenance  Health Maintenance Due  Topic Date Due  . FOOT EXAM  Never done  . PNA vac Low Risk Adult (2 of 2 - PPSV23) 07/02/2019  . OPHTHALMOLOGY EXAM  06/08/2020  . INFLUENZA VACCINE  06/25/2020    Colorectal cancer screening: Completed 01/11/2020. Repeat every 7 years  Lung Cancer Screening: (Low Dose CT Chest recommended if Age 92-80 years, 30 pack-year currently smoking OR have quit w/in 15years.) does not qualify.   Lung Cancer Screening Referral: N/A   Additional Screening:  Hepatitis C Screening: does qualify; Completed 12/23/2016  Vision Screening: Recommended annual ophthalmology exams for early detection of glaucoma and other disorders of the eye. Is the patient up to date with their annual eye exam?  Yes  Who is the provider or what is the name of the office in which the patient attends annual eye exams? Dr. Yolanda Bonine, and Dr. Alois Cliche  If pt is not established with a provider, would they like to be referred to a provider to establish care? No .   Dental Screening: Recommended annual dental exams for proper oral hygiene  Community Resource Referral / Chronic Care Management: CRR required this visit?  No   CCM required this visit?  No      Plan:     I have personally reviewed and noted the following in the patient's chart:   . Medical and social history . Use of alcohol, tobacco or illicit drugs  . Current medications and supplements . Functional ability and  status . Nutritional status . Physical activity . Advanced directives . List of other physicians . Hospitalizations, surgeries, and ER visits in previous 12 months . Vitals . Screenings to include cognitive, depression, and falls . Referrals and appointments  In addition, I have reviewed and discussed with patient certain preventive protocols, quality metrics, and best practice recommendations. A written personalized care plan for preventive services as well as general preventive health recommendations were provided to patient.     Ofilia Neas, LPN   [id number]   Nurse Notes: None

## 2020-09-22 NOTE — Patient Instructions (Signed)
Jon Moses , Thank you for taking time to come for your Medicare Wellness Visit. I appreciate your ongoing commitment to your health goals. Please review the following plan we discussed and let me know if I can assist you in the future.   Screening recommendations/referrals: Colonoscopy: Up to date next due 01/10/2027 Recommended yearly ophthalmology/optometry visit for glaucoma screening and checkup Recommended yearly dental visit for hygiene and checkup  Vaccinations: Influenza vaccine: Up to date, next due October 2022 Pneumococcal vaccine: Currently due for pneumovax 23, you may receive this at your next office visit  Tdap vaccine: Up to date, next due 02/06/2026 Shingles vaccine: Currently due for Shingrix, you may receive this at your local pharmacy or at the local health department.    Advanced directives: Advance directive discussed with you today. Even though you declined this today please call our office should you change your mind and we can give you the proper paperwork for you to fill out.   Conditions/risks identified: None   Next appointment: None   Preventive Care 65 Years and Older, Male Preventive care refers to lifestyle choices and visits with your health care provider that can promote health and wellness. What does preventive care include?  A yearly physical exam. This is also called an annual well check.  Dental exams once or twice a year.  Routine eye exams. Ask your health care provider how often you should have your eyes checked.  Personal lifestyle choices, including:  Daily care of your teeth and gums.  Regular physical activity.  Eating a healthy diet.  Avoiding tobacco and drug use.  Limiting alcohol use.  Practicing safe sex.  Taking low doses of aspirin every day.  Taking vitamin and mineral supplements as recommended by your health care provider. What happens during an annual well check? The services and screenings done by your health  care provider during your annual well check will depend on your age, overall health, lifestyle risk factors, and family history of disease. Counseling  Your health care provider may ask you questions about your:  Alcohol use.  Tobacco use.  Drug use.  Emotional well-being.  Home and relationship well-being.  Sexual activity.  Eating habits.  History of falls.  Memory and ability to understand (cognition).  Work and work Statistician. Screening  You may have the following tests or measurements:  Height, weight, and BMI.  Blood pressure.  Lipid and cholesterol levels. These may be checked every 5 years, or more frequently if you are over 77 years old.  Skin check.  Lung cancer screening. You may have this screening every year starting at age 77 if you have a 30-pack-year history of smoking and currently smoke or have quit within the past 15 years.  Fecal occult blood test (FOBT) of the stool. You may have this test every year starting at age 47.  Flexible sigmoidoscopy or colonoscopy. You may have a sigmoidoscopy every 5 years or a colonoscopy every 10 years starting at age 49.  Prostate cancer screening. Recommendations will vary depending on your family history and other risks.  Hepatitis C blood test.  Hepatitis B blood test.  Sexually transmitted disease (STD) testing.  Diabetes screening. This is done by checking your blood sugar (glucose) after you have not eaten for a while (fasting). You may have this done every 1-3 years.  Abdominal aortic aneurysm (AAA) screening. You may need this if you are a current or former smoker.  Osteoporosis. You may be screened starting at age  70 if you are at high risk. Talk with your health care provider about your test results, treatment options, and if necessary, the need for more tests. Vaccines  Your health care provider may recommend certain vaccines, such as:  Influenza vaccine. This is recommended every  year.  Tetanus, diphtheria, and acellular pertussis (Tdap, Td) vaccine. You may need a Td booster every 10 years.  Zoster vaccine. You may need this after age 62.  Pneumococcal 13-valent conjugate (PCV13) vaccine. One dose is recommended after age 110.  Pneumococcal polysaccharide (PPSV23) vaccine. One dose is recommended after age 36. Talk to your health care provider about which screenings and vaccines you need and how often you need them. This information is not intended to replace advice given to you by your health care provider. Make sure you discuss any questions you have with your health care provider. Document Released: 12/08/2015 Document Revised: 07/31/2016 Document Reviewed: 09/12/2015 Elsevier Interactive Patient Education  2017 [place] Prevention in the Home Falls can cause injuries. They can happen to people of all ages. There are many things you can do to make your home safe and to help prevent falls. What can I do on the outside of my home?  Regularly fix the edges of walkways and driveways and fix any cracks.  Remove anything that might make you trip as you walk through a door, such as a raised step or threshold.  Trim any bushes or trees on the path to your home.  Use bright outdoor lighting.  Clear any walking paths of anything that might make someone trip, such as rocks or tools.  Regularly check to see if handrails are loose or broken. Make sure that both sides of any steps have handrails.  Any raised decks and porches should have guardrails on the edges.  Have any leaves, snow, or ice cleared regularly.  Use sand or salt on walking paths during winter.  Clean up any spills in your garage right away. This includes oil or grease spills. What can I do in the bathroom?  Use night lights.  Install grab bars by the toilet and in the tub and shower. Do not use towel bars as grab bars.  Use non-skid mats or decals in the tub or shower.  If you  need to sit down in the shower, use a plastic, non-slip stool.  Keep the floor dry. Clean up any water that spills on the floor as soon as it happens.  Remove soap buildup in the tub or shower regularly.  Attach bath mats securely with double-sided non-slip rug tape.  Do not have throw rugs and other things on the floor that can make you trip. What can I do in the bedroom?  Use night lights.  Make sure that you have a light by your bed that is easy to reach.  Do not use any sheets or blankets that are too big for your bed. They should not hang down onto the floor.  Have a firm chair that has side arms. You can use this for support while you get dressed.  Do not have throw rugs and other things on the floor that can make you trip. What can I do in the kitchen?  Clean up any spills right away.  Avoid walking on wet floors.  Keep items that you use a lot in easy-to-reach places.  If you need to reach something above you, use a strong step stool that has a grab bar.  Keep  electrical cords out of the way.  Do not use floor polish or wax that makes floors slippery. If you must use wax, use non-skid floor wax.  Do not have throw rugs and other things on the floor that can make you trip. What can I do with my stairs?  Do not leave any items on the stairs.  Make sure that there are handrails on both sides of the stairs and use them. Fix handrails that are broken or loose. Make sure that handrails are as long as the stairways.  Check any carpeting to make sure that it is firmly attached to the stairs. Fix any carpet that is loose or worn.  Avoid having throw rugs at the top or bottom of the stairs. If you do have throw rugs, attach them to the floor with carpet tape.  Make sure that you have a light switch at the top of the stairs and the bottom of the stairs. If you do not have them, ask someone to add them for you. What else can I do to help prevent falls?  Wear shoes  that:  Do not have high heels.  Have rubber bottoms.  Are comfortable and fit you well.  Are closed at the toe. Do not wear sandals.  If you use a stepladder:  Make sure that it is fully opened. Do not climb a closed stepladder.  Make sure that both sides of the stepladder are locked into place.  Ask someone to hold it for you, if possible.  Clearly mark and make sure that you can see:  Any grab bars or handrails.  First and last steps.  Where the edge of each step is.  Use tools that help you move around (mobility aids) if they are needed. These include:  Canes.  Walkers.  Scooters.  Crutches.  Turn on the lights when you go into a dark area. Replace any light bulbs as soon as they burn out.  Set up your furniture so you have a clear path. Avoid moving your furniture around.  If any of your floors are uneven, fix them.  If there are any pets around you, be aware of where they are.  Review your medicines with your doctor. Some medicines can make you feel dizzy. This can increase your chance of falling. Ask your doctor what other things that you can do to help prevent falls. This information is not intended to replace advice given to you by your health care provider. Make sure you discuss any questions you have with your health care provider. Document Released: 09/07/2009 Document Revised: 04/18/2016 Document Reviewed: 12/16/2014 Elsevier Interactive Patient Education  2017 [employer].

## 2020-11-08 DIAGNOSIS — Z23 Encounter for immunization: Secondary | ICD-10-CM | POA: Diagnosis not present

## 2021-03-30 ENCOUNTER — Other Ambulatory Visit: Payer: Self-pay | Admitting: Family Medicine

## 2021-03-30 DIAGNOSIS — Z6825 Body mass index (BMI) 25.0-25.9, adult: Secondary | ICD-10-CM | POA: Diagnosis not present

## 2021-03-30 DIAGNOSIS — H6123 Impacted cerumen, bilateral: Secondary | ICD-10-CM | POA: Diagnosis not present

## 2021-03-30 DIAGNOSIS — H7192 Unspecified cholesteatoma, left ear: Secondary | ICD-10-CM | POA: Diagnosis not present

## 2021-03-30 DIAGNOSIS — H903 Sensorineural hearing loss, bilateral: Secondary | ICD-10-CM | POA: Diagnosis not present

## 2021-04-18 ENCOUNTER — Other Ambulatory Visit: Payer: Self-pay | Admitting: Family Medicine

## 2021-06-15 ENCOUNTER — Other Ambulatory Visit: Payer: Self-pay | Admitting: Family Medicine

## 2021-06-25 ENCOUNTER — Encounter: Payer: Medicare Other | Admitting: Family Medicine

## 2021-07-02 ENCOUNTER — Encounter: Payer: Medicare Other | Admitting: Family Medicine

## 2021-07-10 ENCOUNTER — Encounter: Payer: Medicare Other | Admitting: Family Medicine

## 2021-07-16 ENCOUNTER — Encounter: Payer: Medicare Other | Admitting: Family Medicine

## 2021-07-31 ENCOUNTER — Encounter: Payer: Medicare Other | Admitting: Family Medicine

## 2021-08-01 ENCOUNTER — Encounter: Payer: Medicare Other | Admitting: Family Medicine

## 2021-08-06 ENCOUNTER — Encounter: Payer: Medicare Other | Admitting: Family Medicine

## 2021-08-07 ENCOUNTER — Other Ambulatory Visit: Payer: Self-pay

## 2021-08-08 ENCOUNTER — Ambulatory Visit (INDEPENDENT_AMBULATORY_CARE_PROVIDER_SITE_OTHER): Payer: Medicare Other | Admitting: Family Medicine

## 2021-08-08 VITALS — BP 170/100 | HR 72 | Temp 98.2°F | Wt 181.2 lb

## 2021-08-08 DIAGNOSIS — Z0000 Encounter for general adult medical examination without abnormal findings: Secondary | ICD-10-CM | POA: Diagnosis not present

## 2021-08-08 DIAGNOSIS — Z23 Encounter for immunization: Secondary | ICD-10-CM | POA: Diagnosis not present

## 2021-08-08 MED ORDER — OLMESARTAN MEDOXOMIL-HCTZ 40-12.5 MG PO TABS: 1.0000 | ORAL_TABLET | Freq: Every day | ORAL | 3 refills | Status: DC

## 2021-08-08 MED ORDER — AMLODIPINE BESYLATE 10 MG PO TABS: 10.0000 mg | ORAL_TABLET | Freq: Every day | ORAL | 3 refills | Status: DC

## 2021-08-08 NOTE — Progress Notes (Signed)
Established Patient Office Visit  Subjective:  Patient ID: Jon Moses, male    DOB: 03-17-1951  Age: 70 y.o. MRN: [id number]  CC:  Chief Complaint  Patient presents with   Annual Exam    HPI Jon Moses presents for physical exam.  He has history of hypertension, hyperlipidemia, type 2 diabetes.  Overdue for follow-up.  He is still taking losartan HCTZ and amlodipine but apparently ran out of metformin and Lipitor few weeks ago.  Does not monitor blood sugars regularly.  He has noted that his blood pressure started inching up some in the past few weeks.  He is compliant with current medications.  Previously took Benicar HCTZ and he felt that that worked much better for his blood pressure controlled on losartan.  Health maintenance  -Needs flu vaccine and consents -Needs Pneumovax.  Has had previous Prevnar 13 -No history of Shingrix -Prior hepatitis C screen negative -Tetanus due 2027 -Diabetic eye exam up-to-date -Colonoscopy due 2028  Social history-is married.  He retired a few years ago.  He is enjoying spending time with his 83-year-old grandson.  His grandson lives up in [place] but he is able to keep his grandson about 2 weeks of every month  Family history reviewed with no significant changes  Past Medical History:  Diagnosis Date   Diabetes mellitus without complication ([place])    Hyperlipidemia 03/26/2011   HYPERTENSION 07/17/2010    Past Surgical History:  Procedure Laterality Date   COLONOSCOPY     11 yrs ago from [place]   left   LIPOMA EXCISION Left 12/24/2013   Procedure: EXCISION MASS LEFT BACK;  Surgeon: Zenovia Jarred, MD;  Location: [facility];  Service: General;  Laterality: Left;   TONSILLECTOMY      Family History  Problem Relation Age of Onset   Hypertension Other    Hypertension Mother    Stroke Father    Colon cancer Neg Hx    Colon polyps Neg Hx    Esophageal cancer Neg Hx    Stomach cancer Neg  Hx    Rectal cancer Neg Hx     Social History   Socioeconomic History   Marital status: Married    Spouse name: Not on file   Number of children: Not on file   Years of education: Not on file   Highest education level: Not on file  Occupational History   Not on file  Tobacco Use   Smoking status: Never   Smokeless tobacco: Never  Vaping Use   Vaping Use: Never used  Substance and Sexual Activity   Alcohol use: Yes    Comment: occ   Drug use: No   Sexual activity: Not on file  Other Topics Concern   Not on file  Social History Narrative   Not on file   Social Determinants of Health   Financial Resource Strain: Low Risk    Difficulty of Paying Living Expenses: Not hard at all  Food Insecurity: No Food Insecurity   Worried About Charity fundraiser in the Last Year: Never true   Ran Out of Food in the Last Year: Never true  Transportation Needs: No Transportation Needs   Lack of Transportation (Medical): No   Lack of Transportation (Non-Medical): No  Physical Activity: Sufficiently Active   Days of Exercise per Week: 4 days   Minutes of Exercise per Session: 90 min  Stress: No Stress Concern Present  Feeling of Stress : Not at all  Social Connections: Moderately Integrated   Frequency of Communication with Friends and Family: More than three times a week   Frequency of Social Gatherings with Friends and Family: More than three times a week   Attends Religious Services: More than 4 times per year   Active Member of [employer] or Organizations: No   Attends Archivist Meetings: Never   Marital Status: Married  Human resources officer Violence: Not At Risk   Fear of Current or Ex-Partner: No   Emotionally Abused: No   Physically Abused: No   Sexually Abused: No    Outpatient Medications Prior to Visit  Medication Sig Dispense Refill   atorvastatin (LIPITOR) 20 MG tablet TAKE 1 TABLET BY MOUTH EVERY DAY 90 tablet 3   metFORMIN (GLUCOPHAGE) 500 MG tablet TAKE 1  TABLET(500 MG) BY MOUTH TWICE DAILY WITH A MEAL 180 tablet 1   amLODipine (NORVASC) 5 MG tablet TAKE 1 TABLET BY MOUTH EVERY DAY 90 tablet 3   losartan-hydrochlorothiazide (HYZAAR) 50-12.5 MG tablet TAKE 1 TABLET BY MOUTH EVERY DAY 90 tablet 3   No facility-administered medications prior to visit.    Allergies  Allergen Reactions   Ace Inhibitors Cough    ROS Review of Systems  Constitutional:  Negative for activity change, appetite change, fatigue and fever.  HENT:  Negative for congestion, ear pain and trouble swallowing.   Eyes:  Negative for pain and visual disturbance.  Respiratory:  Negative for cough, shortness of breath and wheezing.   Cardiovascular:  Negative for chest pain and palpitations.  Gastrointestinal:  Negative for abdominal distention, abdominal pain, blood in stool, constipation, diarrhea, nausea, rectal pain and vomiting.  Genitourinary:  Negative for dysuria, hematuria and testicular pain.  Musculoskeletal:  Negative for arthralgias and joint swelling.  Skin:  Negative for rash.  Neurological:  Negative for dizziness, syncope and headaches.  Hematological:  Negative for adenopathy.  Psychiatric/Behavioral:  Negative for confusion and dysphoric mood.      Objective:    Physical Exam Constitutional:      General: He is not in acute distress.    Appearance: He is well-developed.  HENT:     Head: Normocephalic and atraumatic.     Left Ear: External ear normal.     Ears:     Comments: Cerumen impaction right canal Eyes:     Conjunctiva/sclera: Conjunctivae normal.     Pupils: Pupils are equal, round, and reactive to light.  Neck:     Thyroid: No thyromegaly.  Cardiovascular:     Rate and Rhythm: Normal rate and regular rhythm.     Heart sounds: Normal heart sounds. No murmur heard. Pulmonary:     Effort: No respiratory distress.     Breath sounds: No wheezing or rales.  Abdominal:     General: Bowel sounds are normal. There is no distension.      Palpations: Abdomen is soft. There is no mass.     Tenderness: There is no abdominal tenderness. There is no guarding or rebound.  Musculoskeletal:     Cervical back: Normal range of motion and neck supple.     Right lower leg: No edema.     Left lower leg: No edema.  Lymphadenopathy:     Cervical: No cervical adenopathy.  Skin:    Findings: No rash.  Neurological:     Mental Status: He is alert and oriented to person, place, and time.     Cranial Nerves: No  cranial nerve deficit.    BP (!) 170/100 (BP Location: Left Arm, Patient Position: Sitting, Cuff Size: Normal)   Pulse 72   Temp 98.2 F (36.8 C) (Oral)   Wt 181 lb 3.2 oz (82.2 kg)   SpO2 97%   BMI 27.96 kg/m  Wt Readings from Last 3 Encounters:  08/08/21 181 lb 3.2 oz (82.2 kg)  06/23/20 182 lb 3.2 oz (82.6 kg)  01/11/20 188 lb (85.3 kg)     Health Maintenance Due  Topic Date Due   FOOT EXAM  Never done   Zoster Vaccines- Shingrix (1 of 2) Never done   PNA vac Low Risk Adult (2 of 2 - PPSV23) 07/02/2019   COVID-19 Vaccine (3 - Booster for Moderna series) 07/16/2020   HEMOGLOBIN A1C  12/24/2020   INFLUENZA VACCINE  06/25/2021    There are no preventive care reminders to display for this patient.  Lab Results  Component Value Date   TSH 0.72 12/24/2019   Lab Results  Component Value Date   WBC 3.6 (L) 12/24/2019   HGB 14.1 12/24/2019   HCT 41.3 12/24/2019   MCV 91.3 12/24/2019   PLT 250.0 12/24/2019   Lab Results  Component Value Date   NA 134 (L) 12/24/2019   K 4.4 12/24/2019   CO2 29 12/24/2019   GLUCOSE 115 (H) 12/24/2019   BUN 17 12/24/2019   CREATININE 1.21 12/24/2019   BILITOT 0.9 12/24/2019   ALKPHOS 54 12/24/2019   AST 13 12/24/2019   ALT 18 12/24/2019   PROT 7.3 12/24/2019   ALBUMIN 4.6 12/24/2019   CALCIUM 9.6 12/24/2019   GFR 72.35 12/24/2019   Lab Results  Component Value Date   CHOL 146 12/24/2019   Lab Results  Component Value Date   HDL 49.10 12/24/2019   Lab Results   Component Value Date   LDLCALC 84 12/24/2019   Lab Results  Component Value Date   TRIG 64.0 12/24/2019   Lab Results  Component Value Date   CHOLHDL 3 12/24/2019   Lab Results  Component Value Date   HGBA1C 6.4 (A) 06/23/2020      Assessment & Plan:   Problem List Items Addressed This Visit   None Visit Diagnoses     Physical exam    -  Primary   Relevant Orders   Basic metabolic panel   Lipid panel   CBC with Differential/Platelet   TSH   Hepatic function panel   PSA   Hemoglobin A1c     Hypertension which is poorly controlled.  Recommended the following  -Stop his current dose of amlodipine and losartan and start amlodipine 10 mg daily and olmesartan and HCTZ 40/12.5 mg -Set up 1 month follow-up to reassess -Consider Shingrix vaccine at some point this year -Flu vaccine and Pneumovax given -Continue regular exercise habits -Obtain screening labs as above.  Depending on A1c results will likely need to be back on metformin and also will probably need to be back on atorvastatin  Meds ordered this encounter  Medications   amLODipine (NORVASC) 10 MG tablet    Sig: Take 1 tablet (10 mg total) by mouth daily.    Dispense:  90 tablet    Refill:  3   olmesartan-hydrochlorothiazide (BENICAR HCT) 40-12.5 MG tablet    Sig: Take 1 tablet by mouth daily.    Dispense:  90 tablet    Refill:  3    Follow-up: Return in about 1 month (around 09/07/2021).    Darnell Level  Elease Hashimoto, MD

## 2021-08-08 NOTE — Addendum Note (Signed)
Addended by: Rebecca Eaton on: 08/08/2021 04:08 PM   Modules accepted: Orders

## 2021-08-08 NOTE — Addendum Note (Signed)
Addended by: Amanda Cockayne on: 08/08/2021 03:58 PM   Modules accepted: Orders

## 2021-08-08 NOTE — Patient Instructions (Addendum)
Consider shingles vaccine (Shingrix) and can check at pharmacy if interested.  Stop the Losartan and start the Benicar HCTZ  We are also increasing the Amlodipine to 10 mg daily.

## 2021-08-09 ENCOUNTER — Other Ambulatory Visit: Payer: Medicare Other

## 2021-08-09 DIAGNOSIS — Z0000 Encounter for general adult medical examination without abnormal findings: Secondary | ICD-10-CM

## 2021-08-09 LAB — BASIC METABOLIC PANEL WITH GFR
BUN: 13 mg/dL (ref 6–23)
CO2: 31 meq/L (ref 19–32)
Calcium: 9.6 mg/dL (ref 8.4–10.5)
Chloride: 99 meq/L (ref 96–112)
Creatinine, Ser: 1.29 mg/dL (ref 0.40–1.50)
GFR: 56.88 mL/min — ABNORMAL LOW
Glucose, Bld: 103 mg/dL — ABNORMAL HIGH (ref 70–99)
Potassium: 4.1 meq/L (ref 3.5–5.1)
Sodium: 137 meq/L (ref 135–145)

## 2021-08-09 LAB — CBC WITH DIFFERENTIAL/PLATELET
Basophils Absolute: 0 10*3/uL (ref 0.0–0.1)
Basophils Relative: 0.8 % (ref 0.0–3.0)
Eosinophils Absolute: 0.1 10*3/uL (ref 0.0–0.7)
Eosinophils Relative: 3.3 % (ref 0.0–5.0)
HCT: 39.6 % (ref 39.0–52.0)
Hemoglobin: 13.4 g/dL (ref 13.0–17.0)
Lymphocytes Relative: 39.5 % (ref 12.0–46.0)
Lymphs Abs: 1.8 10*3/uL (ref 0.7–4.0)
MCHC: 33.9 g/dL (ref 30.0–36.0)
MCV: 91.3 fl (ref 78.0–100.0)
Monocytes Absolute: 0.4 10*3/uL (ref 0.1–1.0)
Monocytes Relative: 8.1 % (ref 3.0–12.0)
Neutro Abs: 2.2 10*3/uL (ref 1.4–7.7)
Neutrophils Relative %: 48.3 % (ref 43.0–77.0)
Platelets: 251 10*3/uL (ref 150.0–400.0)
RBC: 4.34 Mil/uL (ref 4.22–5.81)
RDW: 13.6 % (ref 11.5–15.5)
WBC: 4.6 10*3/uL (ref 4.0–10.5)

## 2021-08-09 LAB — LIPID PANEL
Cholesterol: 217 mg/dL — ABNORMAL HIGH (ref 0–200)
HDL: 55.9 mg/dL (ref >39.00)
LDL Cholesterol: 143 mg/dL — ABNORMAL HIGH (ref 0–99)
NonHDL: 160.82
Total CHOL/HDL Ratio: 4
Triglycerides: 89 mg/dL (ref 0.0–149.0)
VLDL: 17.8 mg/dL (ref 0.0–40.0)

## 2021-08-09 LAB — HEPATIC FUNCTION PANEL
ALT: 23 U/L (ref 0–53)
AST: 14 U/L (ref 0–37)
Albumin: 4.4 g/dL (ref 3.5–5.2)
Alkaline Phosphatase: 64 U/L (ref 39–117)
Bilirubin, Direct: 0.1 mg/dL (ref 0.0–0.3)
Total Bilirubin: 0.6 mg/dL (ref 0.2–1.2)
Total Protein: 7.3 g/dL (ref 6.0–8.3)

## 2021-08-09 LAB — PSA: PSA: 2.29 ng/mL (ref 0.10–4.00)

## 2021-08-09 LAB — TSH: TSH: 1.25 u[IU]/mL (ref 0.35–5.50)

## 2021-08-09 NOTE — Addendum Note (Signed)
Addended by: Octavio Manns E on: 08/09/2021 11:04 AM   Modules accepted: Orders

## 2021-08-10 LAB — HEMOGLOBIN A1C
Hgb A1c MFr Bld: 6.6 % of total Hgb — ABNORMAL HIGH (ref <5.7)
Mean Plasma Glucose: 143 mg/dL
eAG (mmol/L): 7.9 mmol/L

## 2021-08-13 ENCOUNTER — Other Ambulatory Visit: Payer: Self-pay

## 2021-08-13 MED ORDER — ATORVASTATIN CALCIUM 20 MG PO TABS
20.0000 mg | ORAL_TABLET | Freq: Every day | ORAL | 3 refills | Status: DC
Start: 2021-08-13 — End: 2021-10-26

## 2021-08-20 ENCOUNTER — Other Ambulatory Visit: Payer: Self-pay

## 2021-08-21 ENCOUNTER — Telehealth: Payer: Self-pay

## 2021-08-21 ENCOUNTER — Ambulatory Visit: Payer: Medicare Other | Admitting: Internal Medicine

## 2021-08-21 NOTE — Telephone Encounter (Signed)
Received forms from [facility] stating that the lab for A1C is not covered under the diagnosis of a physical. Diabetes was not listed as a diagnosis for the visit. Please advise.

## 2021-08-21 NOTE — Telephone Encounter (Signed)
Patient called the after hours line and stated that "he had some light headedness while shopping. Saw the MD about a week ago and he started on a new BP medication. He went to see the pharmacist and his BP was 165/90. Pharmacist said that he should start on the new medication. Old medication was Losartan and amlodipine. New medication was Benacar and amlodipine but dose unknown. On the automatic cuff BP was 190."  Please review in Dr. Erick Blinks absence.

## 2021-08-21 NOTE — Telephone Encounter (Signed)
Left message for patient to call back  

## 2021-08-21 NOTE — Telephone Encounter (Signed)
Spoke with the patient. He is aware of Cory's message.  The patient was very frustrated that it has been so difficult for him to receive care. He stated that he had called for an appointment but was told Dr. Elease Hashimoto was out so he made an appointment with Dr. Jerilee Hoh. Then he received the message that she was out of the office. He tried to call in and get an appointment with another provider and we had no openings. He stated that he was very concerned with his blood pressure readings especially since he has never experienced anything like this. He stated that he was offered another appointment for next week but he did not feel comfortable waiting a whole wee. I was able to find an appointment for tomorrow 08/22/2021 with [facility]. Patient agreed to the appointment time for tomorrow and appointment has been scheduled.

## 2021-08-22 ENCOUNTER — Ambulatory Visit: Payer: Medicare Other | Admitting: Adult Health

## 2021-08-22 NOTE — Telephone Encounter (Signed)
You can use R73.9 as the diagnosis code for the A1c  (hyperglycemia)

## 2021-08-22 NOTE — Telephone Encounter (Signed)
Noted. This form is in Dr. Erick Blinks red folder. Will complete when I return to the office.

## 2021-08-27 NOTE — Telephone Encounter (Signed)
Returned to the office this morning and these forms were not in Dr. Anastasio Auerbach folder. All forms and been completed and given back to me but this was not in the stack given to me. Called [facility] and they stated this had already been taken care of. Nothing further needed.

## 2021-08-29 ENCOUNTER — Ambulatory Visit (INDEPENDENT_AMBULATORY_CARE_PROVIDER_SITE_OTHER): Payer: Medicare Other | Admitting: Family Medicine

## 2021-08-29 ENCOUNTER — Other Ambulatory Visit: Payer: Self-pay

## 2021-08-29 VITALS — BP 150/80 | HR 84 | Temp 98.1°F | Wt 181.9 lb

## 2021-08-29 DIAGNOSIS — I10 Essential (primary) hypertension: Secondary | ICD-10-CM | POA: Diagnosis not present

## 2021-08-29 NOTE — Patient Instructions (Signed)
Monitor blood pressure and If top number consistently > 140 in 2 weeks let me know  Keep daily sodium intake < 2,500 mg

## 2021-08-29 NOTE — Progress Notes (Signed)
Established Patient Office Visit  Subjective:  Patient ID: Jon Moses, male    DOB: [birth year]  Age: 70 y.o. MRN: [id number]  CC:  Chief Complaint  Patient presents with   Hypertension    Last weekend BP was 165/100, had gone out of town and forgot meds, BP continues to be elevated, occasional dizzy spells    HPI Jon Moses presents for follow-up regarding blood pressure.  Last week and he had reading 165/100 but he had gone out of town without his medications.  He was out of medications for about 2 days.  He also is keeping his 53-year-old grandson and finds it very stressful at times and thinks that his blood pressure may be up somewhat because of that.  Occasional lightheadedness.  No consistent headaches.  No chest pains.  We had recently switched him from losartan HCTZ to olmesartan HCTZ as he had requested this because of previous good response with telmisartan.  He is also taking amlodipine 10 mg daily.  No consistent alcohol use.  Past Medical History:  Diagnosis Date   Diabetes mellitus without complication ([place])    Hyperlipidemia 03/26/2011   HYPERTENSION 07/17/2010    Past Surgical History:  Procedure Laterality Date   COLONOSCOPY     11 yrs ago from [place]   left   LIPOMA EXCISION Left 12/24/2013   Procedure: EXCISION MASS LEFT BACK;  Surgeon: Zenovia Jarred, MD;  Location: [facility];  Service: General;  Laterality: Left;   TONSILLECTOMY      Family History  Problem Relation Age of Onset   Hypertension Other    Hypertension Mother    Stroke Father    Colon cancer Neg Hx    Colon polyps Neg Hx    Esophageal cancer Neg Hx    Stomach cancer Neg Hx    Rectal cancer Neg Hx     Social History   Socioeconomic History   Marital status: Married    Spouse name: Not on file   Number of children: Not on file   Years of education: Not on file   Highest education level: Not on file  Occupational History   Not on file   Tobacco Use   Smoking status: Never   Smokeless tobacco: Never  Vaping Use   Vaping Use: Never used  Substance and Sexual Activity   Alcohol use: Yes    Comment: occ   Drug use: No   Sexual activity: Not on file  Other Topics Concern   Not on file  Social History Narrative   Not on file   Social Determinants of Health   Financial Resource Strain: Low Risk    Difficulty of Paying Living Expenses: Not hard at all  Food Insecurity: No Food Insecurity   Worried About Charity fundraiser in the Last Year: Never true   Ran Out of Food in the Last Year: Never true  Transportation Needs: No Transportation Needs   Lack of Transportation (Medical): No   Lack of Transportation (Non-Medical): No  Physical Activity: Sufficiently Active   Days of Exercise per Week: 4 days   Minutes of Exercise per Session: 90 min  Stress: No Stress Concern Present   Feeling of Stress : Not at all  Social Connections: Moderately Integrated   Frequency of Communication with Friends and Family: More than three times a week   Frequency of Social Gatherings with Friends and Family: More than  three times a week   Attends Religious Services: More than 4 times per year   Active Member of Clubs or Organizations: No   Attends Archivist Meetings: Never   Marital Status: Married  Human resources officer Violence: Not At Risk   Fear of Current or Ex-Partner: No   Emotionally Abused: No   Physically Abused: No   Sexually Abused: No    Outpatient Medications Prior to Visit  Medication Sig Dispense Refill   amLODipine (NORVASC) 10 MG tablet Take 1 tablet (10 mg total) by mouth daily. 90 tablet 3   atorvastatin (LIPITOR) 20 MG tablet Take 1 tablet (20 mg total) by mouth daily. 90 tablet 3   metFORMIN (GLUCOPHAGE) 500 MG tablet TAKE 1 TABLET(500 MG) BY MOUTH TWICE DAILY WITH A MEAL 180 tablet 1   olmesartan-hydrochlorothiazide (BENICAR HCT) 40-12.5 MG tablet Take 1 tablet by mouth daily. 90 tablet 3   No  facility-administered medications prior to visit.    Allergies  Allergen Reactions   Ace Inhibitors Cough    ROS Review of Systems  Constitutional:  Negative for fatigue.  Eyes:  Negative for visual disturbance.  Respiratory:  Negative for cough, chest tightness and shortness of breath.   Cardiovascular:  Negative for chest pain, palpitations and leg swelling.  Neurological:  Positive for dizziness. Negative for syncope, weakness, light-headedness and headaches.     Objective:    Physical Exam Constitutional:      Appearance: Normal appearance.  Cardiovascular:     Rate and Rhythm: Normal rate and regular rhythm.  Pulmonary:     Effort: Pulmonary effort is normal.     Breath sounds: Normal breath sounds.  Neurological:     Mental Status: He is alert.    BP (!) 150/80 (BP Location: Left Arm, Patient Position: Sitting, Cuff Size: Normal)   Pulse 84   Temp 98.1 F (36.7 C) (Oral)   Wt 181 lb 14.4 oz (82.5 kg)   SpO2 95%   BMI 28.07 kg/m  Wt Readings from Last 3 Encounters:  08/29/21 181 lb 14.4 oz (82.5 kg)  08/08/21 181 lb 3.2 oz (82.2 kg)  06/23/20 182 lb 3.2 oz (82.6 kg)     Health Maintenance Due  Topic Date Due   FOOT EXAM  Never done   Zoster Vaccines- Shingrix (1 of 2) Never done   COVID-19 Vaccine (3 - Booster for Moderna series) 07/16/2020    There are no preventive care reminders to display for this patient.  Lab Results  Component Value Date   TSH 1.25 08/08/2021   Lab Results  Component Value Date   WBC 4.6 08/08/2021   HGB 13.4 08/08/2021   HCT 39.6 08/08/2021   MCV 91.3 08/08/2021   PLT 251.0 08/08/2021   Lab Results  Component Value Date   NA 137 08/08/2021   K 4.1 08/08/2021   CO2 31 08/08/2021   GLUCOSE 103 (H) 08/08/2021   BUN 13 08/08/2021   CREATININE 1.29 08/08/2021   BILITOT 0.6 08/08/2021   ALKPHOS 64 08/08/2021   AST 14 08/08/2021   ALT 23 08/08/2021   PROT 7.3 08/08/2021   ALBUMIN 4.4 08/08/2021   CALCIUM 9.6  08/08/2021   GFR 56.88 (L) 08/08/2021   Lab Results  Component Value Date   CHOL 217 (H) 08/08/2021   Lab Results  Component Value Date   HDL 55.90 08/08/2021   Lab Results  Component Value Date   LDLCALC 143 (H) 08/08/2021   Lab Results  Component Value Date   TRIG 89.0 08/08/2021   Lab Results  Component Value Date   CHOLHDL 4 08/08/2021   Lab Results  Component Value Date   HGBA1C 6.6 (H) 08/09/2021      Assessment & Plan:   Blood pressure is up today but his 48-year-old grandson is with him and he states he feels very stressed out taking care of him today.  -We have elected to give this another 2 weeks or so of monitoring and then close follow-up.  Make sure he is back on his regular medications consistently the next couple of weeks.  We will plan to reassess his blood pressure when he is not taking care of his grandson and if not to goal at that point consider additional medication.  Consider possible low-dose Aldactone versus hydralazine  No orders of the defined types were placed in this encounter.   Follow-up: No follow-ups on file.    Carolann Littler, MD

## 2021-09-12 ENCOUNTER — Telehealth: Payer: Self-pay | Admitting: Family Medicine

## 2021-09-12 NOTE — Telephone Encounter (Signed)
Spoke with the patient. He stated that his right nostril feels restricted mor often than not. No congestion, drainage or any other symptoms. Appointment has been made for the patient to be seen in office.

## 2021-09-12 NOTE — Telephone Encounter (Signed)
Patient is requesting a call back from [place] regarding stuffy nose. I suggested that patient make an appointment to speak with Dr.Burchette but he declined.   Patient could be reached at [phone].  Please advise.

## 2021-09-14 ENCOUNTER — Ambulatory Visit: Payer: Medicare Other | Admitting: Family Medicine

## 2021-09-18 ENCOUNTER — Ambulatory Visit (INDEPENDENT_AMBULATORY_CARE_PROVIDER_SITE_OTHER): Payer: Medicare Other | Admitting: Otolaryngology

## 2021-09-18 ENCOUNTER — Other Ambulatory Visit: Payer: Self-pay

## 2021-09-18 DIAGNOSIS — J310 Chronic rhinitis: Secondary | ICD-10-CM | POA: Diagnosis not present

## 2021-09-18 DIAGNOSIS — H6123 Impacted cerumen, bilateral: Secondary | ICD-10-CM | POA: Diagnosis not present

## 2021-09-18 DIAGNOSIS — H9042 Sensorineural hearing loss, unilateral, left ear, with unrestricted hearing on the contralateral side: Secondary | ICD-10-CM

## 2021-09-18 DIAGNOSIS — H61301 Acquired stenosis of right external ear canal, unspecified: Secondary | ICD-10-CM

## 2021-09-18 MED ORDER — TRIAMCINOLONE ACETONIDE 55 MCG/ACT NA AERO: 2.0000 | INHALATION_SPRAY | Freq: Every day | NASAL | 12 refills | Status: DC

## 2021-09-18 NOTE — Progress Notes (Signed)
HPI: Jon Moses is a 70 y.o. male who presents for evaluation of his ears for wax buildup.  He also complains of mostly right-sided nasal congestion especially at night when he sleeps he frequently has difficulty breathing through the right nostril. He has had previous mastoid surgery with Dr. Rubin Payor in [facility] several decades ago because of cholesteatoma.  He has no serviceable hearing in the left ear and is dependent only on the right ear.  Past Medical History:  Diagnosis Date   Diabetes mellitus without complication ([place])    Hyperlipidemia 03/26/2011   HYPERTENSION 07/17/2010   Past Surgical History:  Procedure Laterality Date   COLONOSCOPY     11 yrs ago from [place]   left   LIPOMA EXCISION Left 12/24/2013   Procedure: EXCISION MASS LEFT BACK;  Surgeon: Zenovia Jarred, MD;  Location: [place];  Service: General;  Laterality: Left;   TONSILLECTOMY     Social History   Socioeconomic History   Marital status: Married    Spouse name: Not on file   Number of children: Not on file   Years of education: Not on file   Highest education level: Not on file  Occupational History   Not on file  Tobacco Use   Smoking status: Never   Smokeless tobacco: Never  Vaping Use   Vaping Use: Never used  Substance and Sexual Activity   Alcohol use: Yes    Comment: occ   Drug use: No   Sexual activity: Not on file  Other Topics Concern   Not on file  Social History Narrative   Not on file   Social Determinants of Health   Financial Resource Strain: Low Risk    Difficulty of Paying Living Expenses: Not hard at all  Food Insecurity: No Food Insecurity   Worried About Estate manager/land agent of Food in the Last Year: Never true   [place] in the Last Year: Never true  Transportation Needs: No Transportation Needs   Lack of Transportation (Medical): No   Lack of Transportation (Non-Medical): No  Physical Activity: Sufficiently  Active   Days of Exercise per Week: 4 days   Minutes of Exercise per Session: 90 min  Stress: No Stress Concern Present   Feeling of Stress : Not at all  Social Connections: Moderately Integrated   Frequency of Communication with Friends and Family: More than three times a week   Frequency of Social Gatherings with Friends and Family: More than three times a week   Attends Religious Services: More than 4 times per year   Active Member of [employer] or Organizations: No   Attends Music therapist: Never   Marital Status: Married   Family History  Problem Relation Age of Onset   Hypertension Other    Hypertension Mother    Stroke Father    Colon cancer Neg Hx    Colon polyps Neg Hx    Esophageal cancer Neg Hx    Stomach cancer Neg Hx    Rectal cancer Neg Hx    Allergies  Allergen Reactions   Ace Inhibitors Cough   Prior to Admission medications   Medication Sig Start Date End Date Taking? Authorizing Provider  amLODipine (NORVASC) 10 MG tablet Take 1 tablet (10 mg total) by mouth daily. 08/08/21   Burchette, Alinda Sierras, MD  atorvastatin (LIPITOR) 20 MG tablet Take 1 tablet (20 mg total) by mouth daily. 08/13/21  Burchette, Alinda Sierras, MD  metFORMIN (GLUCOPHAGE) 500 MG tablet TAKE 1 TABLET(500 MG) BY MOUTH TWICE DAILY WITH A MEAL 07/25/20   Burchette, Alinda Sierras, MD  olmesartan-hydrochlorothiazide (BENICAR HCT) 40-12.5 MG tablet Take 1 tablet by mouth daily. 08/08/21   Burchette, Alinda Sierras, MD     Positive ROS: Otherwise negative  All other systems have been reviewed and were otherwise negative with the exception of those mentioned in the HPI and as above.  Physical Exam: Constitutional: Alert, well-appearing, no acute distress Ears: External ears without lesions or tenderness.  Right ear canal is very stenotic making visualization of the TM difficult.  Small amount of wax was removed from the stenotic area.  No signs of infection or inflammation.  Hearing screening with a tuning  fork revealed AC > BC on the right side with good hearing on the right.  No serviceable hearing on the left side.  On examination of the left ear he has canal wall down mastoidectomy with minimal wax buildup that was cleaned with curette. Nasal: External nose without lesions. Septum with minimal deformity and mild rhinitis.  After decongesting the nose nasal endoscopy was performed.  On nasal endoscopy both posterior nasal cavities are clear and there are no polyps.  Both middle meatus regions are clear with no signs of infection.  The nasopharynx was clear with unobstructed eustachian tube openings. Oral: Lips and gums without lesions. Tongue and palate mucosa without lesions. Posterior oropharynx clear. Neck: No palpable adenopathy or masses Respiratory: Breathing comfortably  Skin: No facial/neck lesions or rash noted.  Cerumen impaction removal  Date/Time: 09/18/2021 5:05 PM Performed by: Rozetta Nunnery, MD Authorized by: Rozetta Nunnery, MD   Consent:    Consent obtained:  Verbal   Consent given by:  Patient   Risks discussed:  Pain and bleeding Procedure details:    Location:  L ear and R ear   Procedure type: curette and suction   Post-procedure details:    Inspection:  TM intact and canal normal   Hearing quality:  Improved   Procedure completion:  Tolerated well, no immediate complications Comments:     Right ear canal is stenotic and was cleaned with suction and curette.  No drainage noted and no signs of infection noted.  Patient is status post radical left mastoidectomy and this area was cleaned with curette and suction.  No signs of infection.  Assessment: Right ear canal stenosis. Patient is status post left radical mastoidectomy with no serviceable hearing. Chronic rhinitis with intermittent nasal obstruction worse on the right side  Plan: For the nasal obstruction recommended regular use of nasal steroid spray Nasacort 2 sprays each nostril at night.   Discussed with him that there is no obstructing lesions or polyps or deviated septum on clinical exam. Concerning the left mastoid recommend having this cleaned every 2 or 3 years or if he has any problems. Concerning the right ear canal because of the stenotic area if he has any problems with hearing on the right side would recommend follow-up with ENT to have this cleaned if needed.  Radene Journey, MD

## 2021-09-28 ENCOUNTER — Ambulatory Visit (INDEPENDENT_AMBULATORY_CARE_PROVIDER_SITE_OTHER): Payer: Medicare Other

## 2021-09-28 ENCOUNTER — Other Ambulatory Visit: Payer: Self-pay

## 2021-09-28 ENCOUNTER — Ambulatory Visit (INDEPENDENT_AMBULATORY_CARE_PROVIDER_SITE_OTHER): Payer: Medicare Other | Admitting: Family Medicine

## 2021-09-28 VITALS — BP 130/70 | HR 81 | Temp 97.9°F | Wt 183.5 lb

## 2021-09-28 DIAGNOSIS — Z0000 Encounter for general adult medical examination without abnormal findings: Secondary | ICD-10-CM | POA: Diagnosis not present

## 2021-09-28 DIAGNOSIS — I10 Essential (primary) hypertension: Secondary | ICD-10-CM | POA: Diagnosis not present

## 2021-09-28 NOTE — Patient Instructions (Signed)
Jon Moses , Thank you for taking time to come for your Medicare Wellness Visit. I appreciate your ongoing commitment to your health goals. Please review the following plan we discussed and let me know if I can assist you in the future.   Screening recommendations/referrals: Colonoscopy: 01/11/2020 Recommended yearly ophthalmology/optometry visit for glaucoma screening and checkup Recommended yearly dental visit for hygiene and checkup  Vaccinations: Influenza vaccine: completed  Pneumococcal vaccine: completed  Tdap vaccine: 02/07/2016 Shingles vaccine: will consider     Advanced directives: none  Conditions/risks identified: none   Next appointment: none   Preventive Care 16 Years and Older, Male Preventive care refers to lifestyle choices and visits with your health care provider that can promote health and wellness. What does preventive care include? A yearly physical exam. This is also called an annual well check. Dental exams once or twice a year. Routine eye exams. Ask your health care provider how often you should have your eyes checked. Personal lifestyle choices, including: Daily care of your teeth and gums. Regular physical activity. Eating a healthy diet. Avoiding tobacco and drug use. Limiting alcohol use. Practicing safe sex. Taking low doses of aspirin every day. Taking vitamin and mineral supplements as recommended by your health care provider. What happens during an annual well check? The services and screenings done by your health care provider during your annual well check will depend on your age, overall health, lifestyle risk factors, and family history of disease. Counseling  Your health care provider may ask you questions about your: Alcohol use. Tobacco use. Drug use. Emotional well-being. Home and relationship well-being. Sexual activity. Eating habits. History of falls. Memory and ability to understand (cognition). Work and work  Statistician. Screening  You may have the following tests or measurements: Height, weight, and BMI. Blood pressure. Lipid and cholesterol levels. These may be checked every 5 years, or more frequently if you are over 57 years old. Skin check. Lung cancer screening. You may have this screening every year starting at age 68 if you have a 30-pack-year history of smoking and currently smoke or have quit within the past 15 years. Fecal occult blood test (FOBT) of the stool. You may have this test every year starting at age 44. Flexible sigmoidoscopy or colonoscopy. You may have a sigmoidoscopy every 5 years or a colonoscopy every 10 years starting at age 64. Prostate cancer screening. Recommendations will vary depending on your family history and other risks. Hepatitis C blood test. Hepatitis B blood test. Sexually transmitted disease (STD) testing. Diabetes screening. This is done by checking your blood sugar (glucose) after you have not eaten for a while (fasting). You may have this done every 1-3 years. Abdominal aortic aneurysm (AAA) screening. You may need this if you are a current or former smoker. Osteoporosis. You may be screened starting at age 63 if you are at high risk. Talk with your health care provider about your test results, treatment options, and if necessary, the need for more tests. Vaccines  Your health care provider may recommend certain vaccines, such as: Influenza vaccine. This is recommended every year. Tetanus, diphtheria, and acellular pertussis (Tdap, Td) vaccine. You may need a Td booster every 10 years. Zoster vaccine. You may need this after age 71. Pneumococcal 13-valent conjugate (PCV13) vaccine. One dose is recommended after age 82. Pneumococcal polysaccharide (PPSV23) vaccine. One dose is recommended after age 72. Talk to your health care provider about which screenings and vaccines you need and how often you need  them. This information is not intended to replace  advice given to you by your health care provider. Make sure you discuss any questions you have with your health care provider. Document Released: 12/08/2015 Document Revised: 07/31/2016 Document Reviewed: 09/12/2015 Elsevier Interactive Patient Education  2017 [place] Prevention in the Home Falls can cause injuries. They can happen to people of all ages. There are many things you can do to make your home safe and to help prevent falls. What can I do on the outside of my home? Regularly fix the edges of walkways and driveways and fix any cracks. Remove anything that might make you trip as you walk through a door, such as a raised step or threshold. Trim any bushes or trees on the path to your home. Use bright outdoor lighting. Clear any walking paths of anything that might make someone trip, such as rocks or tools. Regularly check to see if handrails are loose or broken. Make sure that both sides of any steps have handrails. Any raised decks and porches should have guardrails on the edges. Have any leaves, snow, or ice cleared regularly. Use sand or salt on walking paths during winter. Clean up any spills in your garage right away. This includes oil or grease spills. What can I do in the bathroom? Use night lights. Install grab bars by the toilet and in the tub and shower. Do not use towel bars as grab bars. Use non-skid mats or decals in the tub or shower. If you need to sit down in the shower, use a plastic, non-slip stool. Keep the floor dry. Clean up any water that spills on the floor as soon as it happens. Remove soap buildup in the tub or shower regularly. Attach bath mats securely with double-sided non-slip rug tape. Do not have throw rugs and other things on the floor that can make you trip. What can I do in the bedroom? Use night lights. Make sure that you have a light by your bed that is easy to reach. Do not use any sheets or blankets that are too big for your bed.  They should not hang down onto the floor. Have a firm chair that has side arms. You can use this for support while you get dressed. Do not have throw rugs and other things on the floor that can make you trip. What can I do in the kitchen? Clean up any spills right away. Avoid walking on wet floors. Keep items that you use a lot in easy-to-reach places. If you need to reach something above you, use a strong step stool that has a grab bar. Keep electrical cords out of the way. Do not use floor polish or wax that makes floors slippery. If you must use wax, use non-skid floor wax. Do not have throw rugs and other things on the floor that can make you trip. What can I do with my stairs? Do not leave any items on the stairs. Make sure that there are handrails on both sides of the stairs and use them. Fix handrails that are broken or loose. Make sure that handrails are as long as the stairways. Check any carpeting to make sure that it is firmly attached to the stairs. Fix any carpet that is loose or worn. Avoid having throw rugs at the top or bottom of the stairs. If you do have throw rugs, attach them to the floor with carpet tape. Make sure that you have a light switch at  the top of the stairs and the bottom of the stairs. If you do not have them, ask someone to add them for you. What else can I do to help prevent falls? Wear shoes that: Do not have high heels. Have rubber bottoms. Are comfortable and fit you well. Are closed at the toe. Do not wear sandals. If you use a stepladder: Make sure that it is fully opened. Do not climb a closed stepladder. Make sure that both sides of the stepladder are locked into place. Ask someone to hold it for you, if possible. Clearly mark and make sure that you can see: Any grab bars or handrails. First and last steps. Where the edge of each step is. Use tools that help you move around (mobility aids) if they are needed. These  include: Canes. Walkers. Scooters. Crutches. Turn on the lights when you go into a dark area. Replace any light bulbs as soon as they burn out. Set up your furniture so you have a clear path. Avoid moving your furniture around. If any of your floors are uneven, fix them. If there are any pets around you, be aware of where they are. Review your medicines with your doctor. Some medicines can make you feel dizzy. This can increase your chance of falling. Ask your doctor what other things that you can do to help prevent falls. This information is not intended to replace advice given to you by your health care provider. Make sure you discuss any questions you have with your health care provider. Document Released: 09/07/2009 Document Revised: 04/18/2016 Document Reviewed: 12/16/2014 Elsevier Interactive Patient Education  2017 [employer].

## 2021-09-28 NOTE — Progress Notes (Signed)
Subjective:   Jon Moses is a 70 y.o. male who presents for an Initial Medicare Annual Wellness Visit.  I connected with Jon Moses today by telephone and verified that I am speaking with the correct person using two identifiers. Location patient: home Location provider: work Persons participating in the virtual visit: patient, provider.   I discussed the limitations, risks, security and privacy concerns of performing an evaluation and management service by telephone and the availability of in person appointments. I also discussed with the patient that there may be a patient responsible charge related to this service. The patient expressed understanding and verbally consented to this telephonic visit.    Interactive audio and video telecommunications were attempted between this provider and patient, however failed, due to patient having technical difficulties OR patient did not have access to video capability.  We continued and completed visit with audio only.    Review of Systems           Objective:    Today's Vitals   There is no height or weight on file to calculate BMI.  Advanced Directives 09/28/2021 09/22/2020 09/22/2020 12/21/2013  Does Patient Have a Medical Advance Directive? No No No Patient does not have advance directive;Patient would not like information  Would patient like information on creating a medical advance directive? No - Patient declined No - Patient declined No - Patient declined -    Current Medications (verified) Outpatient Encounter Medications as of 09/28/2021  Medication Sig   amLODipine (NORVASC) 10 MG tablet Take 1 tablet (10 mg total) by mouth daily.   olmesartan-hydrochlorothiazide (BENICAR HCT) 40-12.5 MG tablet Take 1 tablet by mouth daily.   triamcinolone (NASACORT) 55 MCG/ACT AERO nasal inhaler Place 2 sprays into the nose daily. 2 sprays each nostril at night   atorvastatin (LIPITOR) 20 MG tablet Take 1 tablet (20 mg total) by mouth  daily. (Patient not taking: Reported on 09/28/2021)   metFORMIN (GLUCOPHAGE) 500 MG tablet TAKE 1 TABLET(500 MG) BY MOUTH TWICE DAILY WITH A MEAL (Patient not taking: Reported on 09/28/2021)   No facility-administered encounter medications on file as of 09/28/2021.    Allergies (verified) Ace inhibitors   History: Past Medical History:  Diagnosis Date   Diabetes mellitus without complication ([place])    Hyperlipidemia 03/26/2011   HYPERTENSION 07/17/2010   Past Surgical History:  Procedure Laterality Date   COLONOSCOPY     11 yrs ago from [place]   left   LIPOMA EXCISION Left 12/24/2013   Procedure: EXCISION MASS LEFT BACK;  Surgeon: Zenovia Jarred, MD;  Location: [facility];  Service: General;  Laterality: Left;   TONSILLECTOMY     Family History  Problem Relation Age of Onset   Hypertension Other    Hypertension Mother    Stroke Father    Colon cancer Neg Hx    Colon polyps Neg Hx    Esophageal cancer Neg Hx    Stomach cancer Neg Hx    Rectal cancer Neg Hx    Social History   Socioeconomic History   Marital status: Married    Spouse name: Not on file   Number of children: Not on file   Years of education: Not on file   Highest education level: Not on file  Occupational History   Not on file  Tobacco Use   Smoking status: Never   Smokeless tobacco: Never  Vaping Use   Vaping Use: Never used  Substance and Sexual Activity   Alcohol use: Yes    Comment: occ   Drug use: No   Sexual activity: Not on file  Other Topics Concern   Not on file  Social History Narrative   Not on file   Social Determinants of Health   Financial Resource Strain: Low Risk    Difficulty of Paying Living Expenses: Not hard at all  Food Insecurity: No Food Insecurity   Worried About Running Out of Food in the Last Year: Never true   Ran Out of Food in the Last Year: Never true  Transportation Needs: No Transportation Needs   Lack of Transportation  (Medical): No   Lack of Transportation (Non-Medical): No  Physical Activity: Sufficiently Active   Days of Exercise per Week: 7 days   Minutes of Exercise per Session: 60 min  Stress: No Stress Concern Present   Feeling of Stress : Not at all  Social Connections: Socially Integrated   Frequency of Communication with Friends and Family: Twice a week   Frequency of Social Gatherings with Friends and Family: Twice a week   Attends Religious Services: More than 4 times per year   Active Member of [employer] or Organizations: Yes   Attends Music therapist: More than 4 times per year   Marital Status: Married    Tobacco Counseling Counseling given: Not Answered   Clinical Intake:  Pre-visit preparation completed: Yes  Pain : No/denies pain     Nutritional Risks: None Diabetes: No  How often do you need to have someone help you when you read instructions, pamphlets, or other written materials from your doctor or pharmacy?: 1 - Never What is the last grade level you completed in school?: master  Diabetic?no   Interpreter Needed?: No  Information entered by :: L.Crandall Harvel,LPN   Activities of Daily Living No flowsheet data found.  Patient Care Team: Eulas Post, MD as PCP - General  Indicate any recent Medical Services you may have received from other than [facility] providers in the past year (date may be approximate).     Assessment:   This is a routine wellness examination for [place].  Hearing/Vision screen Vision Screening - Comments:: Annual eye exams wear glasses   Dietary issues and exercise activities discussed:     Goals Addressed             This Visit's Progress    Patient Stated   On track    I will continue to walk daily and ride my bike when the weather permits        Depression Screen PHQ 2/9 Scores 09/28/2021 09/28/2021 09/22/2020 12/24/2019 07/01/2018  PHQ - 2 Score 0 0 0 0 0  PHQ- 9 Score - - 0 0 -    Fall Risk Fall Risk   09/28/2021 09/22/2020 12/24/2019 07/01/2018  Falls in the past year? 0 0 0 No  Number falls in past yr: 0 0 - -  Injury with Fall? 0 0 - -  Risk for fall due to : - No Fall Risks - -  Follow up Falls evaluation completed Falls evaluation completed;Falls prevention discussed - -    FALL RISK PREVENTION PERTAINING TO THE HOME:  Any stairs in or around the home? Yes  If so, are there any without handrails? No  Home free of loose throw rugs in walkways, pet beds, electrical cords, etc? Yes  Adequate lighting in your home to reduce risk of falls? Yes   ASSISTIVE  DEVICES UTILIZED TO PREVENT FALLS:  Life alert? No  Use of a cane, walker or w/c? No  Grab bars in the bathroom? No  Shower chair or bench in shower? No  Elevated toilet seat or a handicapped toilet? No    Cognitive Function:    Normal cognitive status assessed by direct observation by this Nurse Health Advisor. No abnormalities found.      Immunizations Immunization History  Administered Date(s) Administered   Fluad Quad(high Dose 65+) 08/08/2021   Influenza Split 10/29/2011, 09/16/2012   Influenza Whole 08/07/2010   Influenza, High Dose Seasonal PF 01/01/2019   Influenza,inj,Quad PF,6+ Mos 10/14/2013, 08/27/2016   Moderna Sars-Covid-2 Vaccination 01/17/2020, 02/14/2020   Pneumococcal Conjugate-13 07/01/2018   Pneumococcal Polysaccharide-23 08/08/2021   Td 11/26/2003   Tdap 02/07/2016    TDAP status: Up to date  Flu Vaccine status: Up to date  Pneumococcal vaccine status: Up to date  Covid-19 vaccine status: Completed vaccines  Qualifies for Shingles Vaccine? Yes   Zostavax completed No   Shingrix Completed?: No.    Education has been provided regarding the importance of this vaccine. Patient has been advised to call insurance company to determine out of pocket expense if they have not yet received this vaccine. Advised may also receive vaccine at local pharmacy or Health Dept. Verbalized acceptance and  understanding.  Screening Tests Health Maintenance  Topic Date Due   FOOT EXAM  Never done   Zoster Vaccines- Shingrix (1 of 2) Never done   COVID-19 Vaccine (3 - Booster for Moderna series) 04/10/2020   HEMOGLOBIN A1C  02/06/2022   OPHTHALMOLOGY EXAM  06/05/2022   TETANUS/TDAP  02/06/2026   COLONOSCOPY (Pts 45-38yrs Insurance coverage will need to be confirmed)  01/10/2027   Pneumonia Vaccine 51+ Years old  Completed   INFLUENZA VACCINE  Completed   Hepatitis C Screening  Completed   HPV VACCINES  Aged Out    Health Maintenance  Health Maintenance Due  Topic Date Due   FOOT EXAM  Never done   Zoster Vaccines- Shingrix (1 of 2) Never done   COVID-19 Vaccine (3 - Booster for Moderna series) 04/10/2020    Colorectal cancer screening: Type of screening: Colonoscopy. Completed 02/16/20217. Repeat every 7 years  Lung Cancer Screening: (Low Dose CT Chest recommended if Age 65-80 years, 30 pack-year currently smoking OR have quit w/in 15years.) does not qualify.   Lung Cancer Screening Referral: n/a  Additional Screening:  Hepatitis C Screening: does not qualify; Completed 12/23/2016  Vision Screening: Recommended annual ophthalmology exams for early detection of glaucoma and other disorders of the eye. Is the patient up to date with their annual eye exam?  Yes  Who is the provider or what is the name of the office in which the patient attends annual eye exams? Dr.dunn  If pt is not established with a provider, would they like to be referred to a provider to establish care? No .   Dental Screening: Recommended annual dental exams for proper oral hygiene  Community Resource Referral / Chronic Care Management: CRR required this visit?  No   CCM required this visit?  No      Plan:     I have personally reviewed and noted the following in the patient's chart:   Medical and social history Use of alcohol, tobacco or illicit drugs  Current medications and supplements  including opioid prescriptions. Patient is not currently taking opioid prescriptions. Functional ability and status Nutritional status Physical activity Advanced directives List  of other physicians Hospitalizations, surgeries, and ER visits in previous 12 months Vitals Screenings to include cognitive, depression, and falls Referrals and appointments  In addition, I have reviewed and discussed with patient certain preventive protocols, quality metrics, and best practice recommendations. A written personalized care plan for preventive services as well as general preventive health recommendations were provided to patient.     Randel Pigg, LPN   90/02/7532   Nurse Notes: none

## 2021-09-28 NOTE — Progress Notes (Signed)
Established Patient Office Visit  Subjective:  Patient ID: Jon Moses, male    DOB: July 02, 1951  Age: 70 y.o. MRN: [id number]  CC:  Chief Complaint  Patient presents with   Follow-up    HPI  NIL [name] presents for hypertension follow-up.  Was here weeks ago but had his grandson with him that day and had very stressful day.  Blood pressure at that point 150/80.  Feels much more relaxed today.  He is taking amlodipine 10 mg daily and Benicar HCTZ 40/12.5 mg daily.  Watches sodium intake.  No regular alcohol use.  Has managed to lose some weight during the past year with his efforts.  He is actually not taking metformin and recent blood sugar well controlled.  Past Medical History:  Diagnosis Date   Diabetes mellitus without complication ([place])    Hyperlipidemia 03/26/2011   HYPERTENSION 07/17/2010    Past Surgical History:  Procedure Laterality Date   COLONOSCOPY     11 yrs ago from [place]   left   LIPOMA EXCISION Left 12/24/2013   Procedure: EXCISION MASS LEFT BACK;  Surgeon: Zenovia Jarred, MD;  Location: [facility];  Service: General;  Laterality: Left;   TONSILLECTOMY      Family History  Problem Relation Age of Onset   Hypertension Other    Hypertension Mother    Stroke Father    Colon cancer Neg Hx    Colon polyps Neg Hx    Esophageal cancer Neg Hx    Stomach cancer Neg Hx    Rectal cancer Neg Hx     Social History   Socioeconomic History   Marital status: Married    Spouse name: Not on file   Number of children: Not on file   Years of education: Not on file   Highest education level: Not on file  Occupational History   Not on file  Tobacco Use   Smoking status: Never   Smokeless tobacco: Never  Vaping Use   Vaping Use: Never used  Substance and Sexual Activity   Alcohol use: Yes    Comment: occ   Drug use: No   Sexual activity: Not on file  Other Topics Concern   Not on file  Social History  Narrative   Not on file   Social Determinants of Health   Financial Resource Strain: Low Risk    Difficulty of Paying Living Expenses: Not hard at all  Food Insecurity: No Food Insecurity   Worried About Charity fundraiser in the Last Year: Never true   Ran Out of Food in the Last Year: Never true  Transportation Needs: No Transportation Needs   Lack of Transportation (Medical): No   Lack of Transportation (Non-Medical): No  Physical Activity: Sufficiently Active   Days of Exercise per Week: 7 days   Minutes of Exercise per Session: 60 min  Stress: No Stress Concern Present   Feeling of Stress : Not at all  Social Connections: Socially Integrated   Frequency of Communication with Friends and Family: Twice a week   Frequency of Social Gatherings with Friends and Family: Twice a week   Attends Religious Services: More than 4 times per year   Active Member of [employer] or Organizations: Yes   Attends Music therapist: More than 4 times per year   Marital Status: Married  Human resources officer Violence: Not At Risk   Fear of Current or  Ex-Partner: No   Emotionally Abused: No   Physically Abused: No   Sexually Abused: No    Outpatient Medications Prior to Visit  Medication Sig Dispense Refill   amLODipine (NORVASC) 10 MG tablet Take 1 tablet (10 mg total) by mouth daily. 90 tablet 3   atorvastatin (LIPITOR) 20 MG tablet Take 1 tablet (20 mg total) by mouth daily. 90 tablet 3   metFORMIN (GLUCOPHAGE) 500 MG tablet TAKE 1 TABLET(500 MG) BY MOUTH TWICE DAILY WITH A MEAL 180 tablet 1   olmesartan-hydrochlorothiazide (BENICAR HCT) 40-12.5 MG tablet Take 1 tablet by mouth daily. 90 tablet 3   triamcinolone (NASACORT) 55 MCG/ACT AERO nasal inhaler Place 2 sprays into the nose daily. 2 sprays each nostril at night 1 each 12   No facility-administered medications prior to visit.    Allergies  Allergen Reactions   Ace Inhibitors Cough    ROS Review of Systems  Constitutional:   Negative for fatigue.  Eyes:  Negative for visual disturbance.  Respiratory:  Negative for cough, chest tightness and shortness of breath.   Cardiovascular:  Negative for chest pain, palpitations and leg swelling.  Neurological:  Negative for dizziness, syncope, weakness, light-headedness and headaches.     Objective:    Physical Exam Constitutional:      Appearance: He is well-developed.  HENT:     Right Ear: External ear normal.     Left Ear: External ear normal.  Eyes:     Pupils: Pupils are equal, round, and reactive to light.  Neck:     Thyroid: No thyromegaly.  Cardiovascular:     Rate and Rhythm: Normal rate and regular rhythm.  Pulmonary:     Effort: Pulmonary effort is normal. No respiratory distress.     Breath sounds: Normal breath sounds. No wheezing or rales.  Musculoskeletal:     Cervical back: Neck supple.     Right lower leg: No edema.     Left lower leg: No edema.  Neurological:     Mental Status: He is alert and oriented to person, place, and time.    BP 130/70 (BP Location: Left Arm, Patient Position: Sitting, Cuff Size: Normal)   Pulse 81   Temp 97.9 F (36.6 C) (Oral)   Wt 183 lb 8 oz (83.2 kg)   SpO2 98%   BMI 28.32 kg/m  Wt Readings from Last 3 Encounters:  09/28/21 183 lb 8 oz (83.2 kg)  08/29/21 181 lb 14.4 oz (82.5 kg)  08/08/21 181 lb 3.2 oz (82.2 kg)     Health Maintenance Due  Topic Date Due   FOOT EXAM  Never done   Zoster Vaccines- Shingrix (1 of 2) Never done   COVID-19 Vaccine (3 - Booster for Moderna series) 04/10/2020    There are no preventive care reminders to display for this patient.  Lab Results  Component Value Date   TSH 1.25 08/08/2021   Lab Results  Component Value Date   WBC 4.6 08/08/2021   HGB 13.4 08/08/2021   HCT 39.6 08/08/2021   MCV 91.3 08/08/2021   PLT 251.0 08/08/2021   Lab Results  Component Value Date   NA 137 08/08/2021   K 4.1 08/08/2021   CO2 31 08/08/2021   GLUCOSE 103 (H) 08/08/2021    BUN 13 08/08/2021   CREATININE 1.29 08/08/2021   BILITOT 0.6 08/08/2021   ALKPHOS 64 08/08/2021   AST 14 08/08/2021   ALT 23 08/08/2021   PROT 7.3 08/08/2021   ALBUMIN  4.4 08/08/2021   CALCIUM 9.6 08/08/2021   GFR 56.88 (L) 08/08/2021   Lab Results  Component Value Date   CHOL 217 (H) 08/08/2021   Lab Results  Component Value Date   HDL 55.90 08/08/2021   Lab Results  Component Value Date   LDLCALC 143 (H) 08/08/2021   Lab Results  Component Value Date   TRIG 89.0 08/08/2021   Lab Results  Component Value Date   CHOLHDL 4 08/08/2021   Lab Results  Component Value Date   HGBA1C 6.6 (H) 08/09/2021      Assessment & Plan:   #1 essential hypertension improved.  Continue current regimen as above.  Continue weight loss and weight control efforts.  Continue regular aerobic activity such as walking  #2 type 2 diabetes.  Stable by recent labs.  He states he actually was not even taking his metformin at that point.  Continue off medications as long as it stays controlled.  Recheck A1c at follow-up in a few months     Follow-up: Return in about 6 months (around 03/28/2022).    Carolann Littler, MD

## 2021-09-28 NOTE — Patient Instructions (Signed)
Let's plan on 6 month follow up.  BP improved today.

## 2021-10-24 DIAGNOSIS — R9431 Abnormal electrocardiogram [ECG] [EKG]: Secondary | ICD-10-CM | POA: Diagnosis not present

## 2021-10-24 DIAGNOSIS — I440 Atrioventricular block, first degree: Secondary | ICD-10-CM | POA: Diagnosis not present

## 2021-10-24 DIAGNOSIS — M62838 Other muscle spasm: Secondary | ICD-10-CM | POA: Diagnosis not present

## 2021-10-24 DIAGNOSIS — R42 Dizziness and giddiness: Secondary | ICD-10-CM | POA: Diagnosis not present

## 2021-10-25 DIAGNOSIS — I440 Atrioventricular block, first degree: Secondary | ICD-10-CM | POA: Diagnosis not present

## 2021-10-26 ENCOUNTER — Encounter: Payer: Self-pay | Admitting: Family Medicine

## 2021-10-26 ENCOUNTER — Ambulatory Visit (INDEPENDENT_AMBULATORY_CARE_PROVIDER_SITE_OTHER): Payer: Medicare Other | Admitting: Family Medicine

## 2021-10-26 VITALS — BP 134/76 | HR 67 | Temp 98.4°F | Ht 67.5 in | Wt 181.1 lb

## 2021-10-26 DIAGNOSIS — E785 Hyperlipidemia, unspecified: Secondary | ICD-10-CM | POA: Diagnosis not present

## 2021-10-26 DIAGNOSIS — R202 Paresthesia of skin: Secondary | ICD-10-CM | POA: Diagnosis not present

## 2021-10-26 DIAGNOSIS — I10 Essential (primary) hypertension: Secondary | ICD-10-CM | POA: Diagnosis not present

## 2021-10-26 MED ORDER — ATORVASTATIN CALCIUM 20 MG PO TABS: 20.0000 mg | ORAL_TABLET | Freq: Every day | ORAL | 3 refills | Status: DC

## 2021-10-26 NOTE — Patient Instructions (Signed)
Get back on the Lipitor 20 mg daily  Start baby aspirin 81 mg daily  I am setting up neurology referral.    Follow up immediately for any speech changes or recurrent weakness.

## 2021-10-26 NOTE — Progress Notes (Signed)
Established Patient Office Visit  Subjective:  Patient ID: Jon Moses, male    DOB: 1951-11-10  Age: 70 y.o. MRN: [id number]  CC:  Chief Complaint  Patient presents with   Follow-up    HPI Jon Moses presents for recent ER evaluation for dizziness and question of transient left upper and lower extremity weakness.  He was seen at ER facility with [facility] on November 30.  He has past medical history of hypertension and remote history of cholesteatoma with surgery 1975 with some mild left facial nerve weakness since the surgery.  He states around 10 AM 2 days ago he had sudden onset of dizziness.  Denied any vertigo symptoms.  This was more described as lightheadedness.  He noticed some tingling sensation in the left hand followed later on by some question of weakness involving the left lower extremity.  No recent head trauma.  No chest pains.  No dyspnea.  No vision changes.  Was not aware of any slurred speech at that time.  He felt like he was slightly "off balance ".  Blood pressure in the ER was elevated at 182/92.  He had lab work including troponins, CBC, CMP with no acute abnormalities.  He had CT of the head and MRI brain which showed no acute abnormalities.  No evidence for stroke.  CT angiogram of the neck revealed no significant ICA stenosis.  He did have findings consistent with chronic microvascular disease and age commensurate atrophy.  He states by late that afternoon around 5 or 6 PM his symptoms had pretty much resolved.  Wife thinks that he has had some slightly increased left facial weakness compared to his chronic left facial weakness.  He is ambulating without difficulty.  No swallowing dysfunction.  He apparently was given meclizine in the ER though he denied any recent vertigo symptoms.  He was also given a prescription by ER physician for Robaxin for possible left upper extremity muscle spasm.  He does not think this has helped.  He has hyperlipidemia.   Recent LDL cholesterol 143.  He has prescription for Lipitor but apparently has not been taking this.  Recent A1c 6.6%.  He takes combination of amlodipine and Benicar HCTZ for hypertension.  Blood pressures recently ranging around 376E systolic  Past Medical History:  Diagnosis Date   Diabetes mellitus without complication ([place])    Hyperlipidemia 03/26/2011   HYPERTENSION 07/17/2010    Past Surgical History:  Procedure Laterality Date   COLONOSCOPY     11 yrs ago from [place]   left   LIPOMA EXCISION Left 12/24/2013   Procedure: EXCISION MASS LEFT BACK;  Surgeon: Zenovia Jarred, MD;  Location: [facility];  Service: General;  Laterality: Left;   TONSILLECTOMY      Family History  Problem Relation Age of Onset   Hypertension Other    Hypertension Mother    Stroke Father    Colon cancer Neg Hx    Colon polyps Neg Hx    Esophageal cancer Neg Hx    Stomach cancer Neg Hx    Rectal cancer Neg Hx     Social History   Socioeconomic History   Marital status: Married    Spouse name: Not on file   Number of children: Not on file   Years of education: Not on file   Highest education level: Not on file  Occupational History   Not on file  Tobacco  Use   Smoking status: Never   Smokeless tobacco: Never  Vaping Use   Vaping Use: Never used  Substance and Sexual Activity   Alcohol use: Yes    Comment: occ   Drug use: No   Sexual activity: Not on file  Other Topics Concern   Not on file  Social History Narrative   Not on file   Social Determinants of Health   Financial Resource Strain: Low Risk    Difficulty of Paying Living Expenses: Not hard at all  Food Insecurity: No Food Insecurity   Worried About Charity fundraiser in the Last Year: Never true   [place] in the Last Year: Never true  Transportation Needs: No Transportation Needs   Lack of Transportation (Medical): No   Lack of Transportation (Non-Medical): No  Physical  Activity: Sufficiently Active   Days of Exercise per Week: 7 days   Minutes of Exercise per Session: 60 min  Stress: No Stress Concern Present   Feeling of Stress : Not at all  Social Connections: Socially Integrated   Frequency of Communication with Friends and Family: Twice a week   Frequency of Social Gatherings with Friends and Family: Twice a week   Attends Religious Services: More than 4 times per year   Active Member of [employer] or Organizations: Yes   Attends Music therapist: More than 4 times per year   Marital Status: Married  Human resources officer Violence: Not At Risk   Fear of Current or Ex-Partner: No   Emotionally Abused: No   Physically Abused: No   Sexually Abused: No    Outpatient Medications Prior to Visit  Medication Sig Dispense Refill   amLODipine (NORVASC) 10 MG tablet Take 1 tablet (10 mg total) by mouth daily. 90 tablet 3   metFORMIN (GLUCOPHAGE) 500 MG tablet TAKE 1 TABLET(500 MG) BY MOUTH TWICE DAILY WITH A MEAL 180 tablet 1   olmesartan-hydrochlorothiazide (BENICAR HCT) 40-12.5 MG tablet Take 1 tablet by mouth daily. 90 tablet 3   triamcinolone (NASACORT) 55 MCG/ACT AERO nasal inhaler Place 2 sprays into the nose daily. 2 sprays each nostril at night 1 each 12   atorvastatin (LIPITOR) 20 MG tablet Take 1 tablet (20 mg total) by mouth daily. 90 tablet 3   No facility-administered medications prior to visit.    Allergies  Allergen Reactions   Ace Inhibitors Cough    ROS Review of Systems  Constitutional:  Negative for chills and fever.  HENT:  Negative for trouble swallowing.   Respiratory:  Negative for cough and shortness of breath.   Cardiovascular:  Negative for chest pain.  Gastrointestinal:  Negative for abdominal pain.  Genitourinary:  Negative for dysuria.  Neurological:  Positive for dizziness. Negative for headaches.       See HPI     Objective:    Physical Exam Vitals reviewed.  Constitutional:      Appearance: Normal  appearance.  Cardiovascular:     Rate and Rhythm: Normal rate and regular rhythm.  Pulmonary:     Effort: Pulmonary effort is normal.     Breath sounds: Normal breath sounds.  Musculoskeletal:     Cervical back: Neck supple.     Right lower leg: No edema.     Left lower leg: No edema.  Lymphadenopathy:     Cervical: No cervical adenopathy.  Neurological:     Mental Status: He is alert.     Comments: Left facial droop on the  mouth.  This is been somewhat chronic related to facial surgery for cholesteatoma back in Feb 15, 1974 it seems more pronounced today than this has previously.  He does not have any evidence for muscular weakness involving the forehead  He does seem to have some weakness with left hip flexion compared to the right.  Generally weaker left upper extremity with elbow flexion and extension compared to the right side.  He is right-hand dominant.  Gait normal.    BP 134/76 (BP Location: Left Arm, Patient Position: Sitting, Cuff Size: Normal)   Pulse 67   Temp 98.4 F (36.9 C) (Oral)   Ht 5' 7.5" (1.715 m)   Wt 181 lb 1.6 oz (82.1 kg)   SpO2 96%   BMI 27.95 kg/m  Wt Readings from Last 3 Encounters:  10/26/21 181 lb 1.6 oz (82.1 kg)  09/28/21 183 lb 8 oz (83.2 kg)  08/29/21 181 lb 14.4 oz (82.5 kg)     Health Maintenance Due  Topic Date Due   FOOT EXAM  Never done   Zoster Vaccines- Shingrix (1 of 2) Never done   COVID-19 Vaccine (3 - Booster for Moderna series) 04/10/2020    There are no preventive care reminders to display for this patient.  Lab Results  Component Value Date   TSH 1.25 08/08/2021   Lab Results  Component Value Date   WBC 4.6 08/08/2021   HGB 13.4 08/08/2021   HCT 39.6 08/08/2021   MCV 91.3 08/08/2021   PLT 251.0 08/08/2021   Lab Results  Component Value Date   NA 137 08/08/2021   K 4.1 08/08/2021   CO2 31 08/08/2021   GLUCOSE 103 (H) 08/08/2021   BUN 13 08/08/2021   CREATININE 1.29 08/08/2021   BILITOT 0.6 08/08/2021   ALKPHOS  64 08/08/2021   AST 14 08/08/2021   ALT 23 08/08/2021   PROT 7.3 08/08/2021   ALBUMIN 4.4 08/08/2021   CALCIUM 9.6 08/08/2021   GFR 56.88 (L) 08/08/2021   Lab Results  Component Value Date   CHOL 217 (H) 08/08/2021   Lab Results  Component Value Date   HDL 55.90 08/08/2021   Lab Results  Component Value Date   LDLCALC 143 (H) 08/08/2021   Lab Results  Component Value Date   TRIG 89.0 08/08/2021   Lab Results  Component Value Date   CHOLHDL 4 08/08/2021   Lab Results  Component Value Date   HGBA1C 6.6 (H) 08/09/2021      Assessment & Plan:   #1 recent acute onset of dizziness and paresthesias left upper extremity involving the hand and question of transient weakness left upper and lower extremity.  ER work-up as above including CT angiogram neck, CT head, MRI of the brain no acute findings.  No obvious stroke.  Chronic microvascular changes.  No critical ICA stenosis. My concern is whether he may have still had a TIA.  His symptoms lasted apparently about 8 hours and then gradually have improved somewhat. -Recommend he start baby aspirin 81 mg daily -Get back on Lipitor 20 mg daily and may need further titration up -Set up neurology referral  #2 hypertension.  Not quite optimally controlled today on amlodipine and Benicar HCTZ.  With recent symptoms above would not recommend aggressive intervention yet but if this is remaining of 130/80 in the next couple weeks consider further titration of medication  #3 hyperlipidemia.  Goal LDL less than 70.  Get back on Lipitor and plan to recheck lipids in couple months  #  4 history of chronic left facial nerve weakness following surgery for cholesteatoma 1975.  Does seem to have denser deficit today compared to his baseline.  We will get neurology opinion regarding that as well.  Meds ordered this encounter  Medications   atorvastatin (LIPITOR) 20 MG tablet    Sig: Take 1 tablet (20 mg total) by mouth daily.    Dispense:  90  tablet    Refill:  3    Follow-up: No follow-ups on file.    Carolann Littler, MD

## 2021-10-29 ENCOUNTER — Telehealth: Payer: Self-pay

## 2021-10-29 DIAGNOSIS — Z823 Family history of stroke: Secondary | ICD-10-CM | POA: Diagnosis not present

## 2021-10-29 DIAGNOSIS — D631 Anemia in chronic kidney disease: Secondary | ICD-10-CM | POA: Diagnosis not present

## 2021-10-29 DIAGNOSIS — R42 Dizziness and giddiness: Secondary | ICD-10-CM | POA: Diagnosis not present

## 2021-10-29 DIAGNOSIS — I129 Hypertensive chronic kidney disease with stage 1 through stage 4 chronic kidney disease, or unspecified chronic kidney disease: Secondary | ICD-10-CM | POA: Diagnosis not present

## 2021-10-29 DIAGNOSIS — M4316 Spondylolisthesis, lumbar region: Secondary | ICD-10-CM | POA: Diagnosis not present

## 2021-10-29 DIAGNOSIS — Z7982 Long term (current) use of aspirin: Secondary | ICD-10-CM | POA: Diagnosis not present

## 2021-10-29 DIAGNOSIS — N189 Chronic kidney disease, unspecified: Secondary | ICD-10-CM | POA: Diagnosis not present

## 2021-10-29 DIAGNOSIS — R29898 Other symptoms and signs involving the musculoskeletal system: Secondary | ICD-10-CM | POA: Diagnosis not present

## 2021-10-29 DIAGNOSIS — M5136 Other intervertebral disc degeneration, lumbar region: Secondary | ICD-10-CM | POA: Diagnosis not present

## 2021-10-29 DIAGNOSIS — G8194 Hemiplegia, unspecified affecting left nondominant side: Secondary | ICD-10-CM | POA: Diagnosis not present

## 2021-10-29 DIAGNOSIS — R29810 Facial weakness: Secondary | ICD-10-CM | POA: Diagnosis not present

## 2021-10-29 DIAGNOSIS — R531 Weakness: Secondary | ICD-10-CM | POA: Diagnosis not present

## 2021-10-29 DIAGNOSIS — M47816 Spondylosis without myelopathy or radiculopathy, lumbar region: Secondary | ICD-10-CM | POA: Diagnosis not present

## 2021-10-29 DIAGNOSIS — I6329 Cerebral infarction due to unspecified occlusion or stenosis of other precerebral arteries: Secondary | ICD-10-CM | POA: Diagnosis not present

## 2021-10-29 NOTE — Telephone Encounter (Signed)
Wife of patient called asking if Dr. Elease Hashimoto would give [facility] a call to see if they can get the patient an appt today. Wife stated it is urgent that patient be seen.

## 2021-10-29 NOTE — Telephone Encounter (Signed)
Spoke with wife, she stating that they are currently in the ED at [facility].   Patient has extreme weakness on left side.  Informed wife that referral has been place and should receive a call within one week.

## 2021-10-30 DIAGNOSIS — R278 Other lack of coordination: Secondary | ICD-10-CM | POA: Diagnosis not present

## 2021-10-30 DIAGNOSIS — R208 Other disturbances of skin sensation: Secondary | ICD-10-CM | POA: Diagnosis not present

## 2021-10-30 DIAGNOSIS — R531 Weakness: Secondary | ICD-10-CM | POA: Diagnosis not present

## 2021-10-31 DIAGNOSIS — I10 Essential (primary) hypertension: Secondary | ICD-10-CM | POA: Diagnosis not present

## 2021-10-31 DIAGNOSIS — N189 Chronic kidney disease, unspecified: Secondary | ICD-10-CM | POA: Diagnosis not present

## 2021-10-31 DIAGNOSIS — Z7982 Long term (current) use of aspirin: Secondary | ICD-10-CM | POA: Diagnosis not present

## 2021-10-31 DIAGNOSIS — R638 Other symptoms and signs concerning food and fluid intake: Secondary | ICD-10-CM | POA: Diagnosis not present

## 2021-10-31 DIAGNOSIS — M4316 Spondylolisthesis, lumbar region: Secondary | ICD-10-CM | POA: Diagnosis not present

## 2021-10-31 DIAGNOSIS — I6329 Cerebral infarction due to unspecified occlusion or stenosis of other precerebral arteries: Secondary | ICD-10-CM | POA: Diagnosis not present

## 2021-10-31 DIAGNOSIS — R7309 Other abnormal glucose: Secondary | ICD-10-CM | POA: Diagnosis not present

## 2021-10-31 DIAGNOSIS — D631 Anemia in chronic kidney disease: Secondary | ICD-10-CM | POA: Diagnosis not present

## 2021-10-31 DIAGNOSIS — R29810 Facial weakness: Secondary | ICD-10-CM | POA: Diagnosis not present

## 2021-10-31 DIAGNOSIS — M47816 Spondylosis without myelopathy or radiculopathy, lumbar region: Secondary | ICD-10-CM | POA: Diagnosis not present

## 2021-10-31 DIAGNOSIS — Z823 Family history of stroke: Secondary | ICD-10-CM | POA: Diagnosis not present

## 2021-10-31 DIAGNOSIS — R42 Dizziness and giddiness: Secondary | ICD-10-CM | POA: Diagnosis not present

## 2021-10-31 DIAGNOSIS — M5136 Other intervertebral disc degeneration, lumbar region: Secondary | ICD-10-CM | POA: Diagnosis not present

## 2021-10-31 DIAGNOSIS — R531 Weakness: Secondary | ICD-10-CM | POA: Diagnosis not present

## 2021-10-31 DIAGNOSIS — G8194 Hemiplegia, unspecified affecting left nondominant side: Secondary | ICD-10-CM | POA: Diagnosis not present

## 2021-10-31 DIAGNOSIS — I129 Hypertensive chronic kidney disease with stage 1 through stage 4 chronic kidney disease, or unspecified chronic kidney disease: Secondary | ICD-10-CM | POA: Diagnosis not present

## 2021-11-01 ENCOUNTER — Ambulatory Visit: Payer: Self-pay | Admitting: Neurology

## 2021-11-01 ENCOUNTER — Telehealth: Payer: Self-pay | Admitting: Neurology

## 2021-11-01 DIAGNOSIS — I6329 Cerebral infarction due to unspecified occlusion or stenosis of other precerebral arteries: Secondary | ICD-10-CM | POA: Diagnosis not present

## 2021-11-01 DIAGNOSIS — R29810 Facial weakness: Secondary | ICD-10-CM | POA: Diagnosis not present

## 2021-11-01 DIAGNOSIS — D631 Anemia in chronic kidney disease: Secondary | ICD-10-CM | POA: Diagnosis not present

## 2021-11-01 DIAGNOSIS — M47816 Spondylosis without myelopathy or radiculopathy, lumbar region: Secondary | ICD-10-CM | POA: Diagnosis not present

## 2021-11-01 DIAGNOSIS — G8194 Hemiplegia, unspecified affecting left nondominant side: Secondary | ICD-10-CM | POA: Diagnosis not present

## 2021-11-01 DIAGNOSIS — N189 Chronic kidney disease, unspecified: Secondary | ICD-10-CM | POA: Diagnosis not present

## 2021-11-01 DIAGNOSIS — R531 Weakness: Secondary | ICD-10-CM | POA: Diagnosis not present

## 2021-11-01 DIAGNOSIS — R7309 Other abnormal glucose: Secondary | ICD-10-CM | POA: Diagnosis not present

## 2021-11-01 DIAGNOSIS — M4316 Spondylolisthesis, lumbar region: Secondary | ICD-10-CM | POA: Diagnosis not present

## 2021-11-01 DIAGNOSIS — Z7982 Long term (current) use of aspirin: Secondary | ICD-10-CM | POA: Diagnosis not present

## 2021-11-01 DIAGNOSIS — Z823 Family history of stroke: Secondary | ICD-10-CM | POA: Diagnosis not present

## 2021-11-01 DIAGNOSIS — M5136 Other intervertebral disc degeneration, lumbar region: Secondary | ICD-10-CM | POA: Diagnosis not present

## 2021-11-01 DIAGNOSIS — R42 Dizziness and giddiness: Secondary | ICD-10-CM | POA: Diagnosis not present

## 2021-11-01 DIAGNOSIS — I129 Hypertensive chronic kidney disease with stage 1 through stage 4 chronic kidney disease, or unspecified chronic kidney disease: Secondary | ICD-10-CM | POA: Diagnosis not present

## 2021-11-01 DIAGNOSIS — R638 Other symptoms and signs concerning food and fluid intake: Secondary | ICD-10-CM | POA: Diagnosis not present

## 2021-11-01 NOTE — Telephone Encounter (Signed)
Wife cancelled appt due to pt has been admitted to [facility]

## 2021-11-06 ENCOUNTER — Inpatient Hospital Stay: Payer: Medicare Other | Admitting: Family Medicine

## 2021-11-07 ENCOUNTER — Ambulatory Visit (INDEPENDENT_AMBULATORY_CARE_PROVIDER_SITE_OTHER): Payer: Medicare Other | Admitting: Family Medicine

## 2021-11-07 VITALS — BP 130/72 | HR 78 | Temp 98.3°F | Wt 180.0 lb

## 2021-11-07 DIAGNOSIS — I10 Essential (primary) hypertension: Secondary | ICD-10-CM

## 2021-11-07 DIAGNOSIS — E1165 Type 2 diabetes mellitus with hyperglycemia: Secondary | ICD-10-CM

## 2021-11-07 DIAGNOSIS — I69354 Hemiplegia and hemiparesis following cerebral infarction affecting left non-dominant side: Secondary | ICD-10-CM

## 2021-11-07 DIAGNOSIS — I639 Cerebral infarction, unspecified: Secondary | ICD-10-CM

## 2021-11-07 NOTE — Patient Instructions (Signed)
Set up follow up with Neurology  Monitor BP at home and goal is < 130/80  Keep daily sodium intake < 2,500 mg   Set up follow up in 2 weeks and will get repeat lipids at that time.

## 2021-11-07 NOTE — Progress Notes (Signed)
Established Patient Office Visit  Subjective:  Patient ID: Jon Moses, male    DOB: [birth year]  Age: 70 y.o. MRN: [id number]  CC:  Chief Complaint  Patient presents with   Hospitalization Follow-up    HPI KAHLIN Moses presents for hospital follow-up.  Patient had recent admission at [facility] for CVA.  He had been seen in the ER on 10/30/2021 with question of mild left-sided weakness with upper extremity.  He had also had some paresthesia in the left arm.  He had previous work-up with CT angiogram head neck and MRI which did not show any acute abnormalities.  He then had some progression in symptoms and was seen on 11/01/2021 and admitted.  Repeat MRI scan showed acute ischemic stroke.  He had TTE which was unremarkable.  Barium swallow study showed no oropharyngeal dysfunction.  Lower extremity Doppler no DVT.  He was discharged home on aspirin 81 mg daily.  No other changes in medication were made.  He had recent A1c 6.5% recent LDL cholesterol 142 and he just recently started back on his Lipitor.  His other medications include amlodipine 10 mg daily, metformin 500 mg twice daily, and Benicar HCTZ 40/12.5 mg 1 daily.  MRI brain showed restricted diffusion in the right paramedian pons consistent with early/subacute infarct also had additional punctate acute subacute infarct cortex of right parieto-occipital region.  Patient does feel like he is improving some at this time.  Less weakness left lower and upper extremity.  No other new symptoms.  He has some home PT waiting to be set up along with OT  Past Medical History:  Diagnosis Date   Diabetes mellitus without complication ([place])    Hyperlipidemia 03/26/2011   HYPERTENSION 07/17/2010    Past Surgical History:  Procedure Laterality Date   COLONOSCOPY     11 yrs ago from [place]   left   LIPOMA EXCISION Left 12/24/2013   Procedure: EXCISION MASS LEFT BACK;  Surgeon: Zenovia Jarred, MD;  Location: [facility];  Service: General;  Laterality: Left;   TONSILLECTOMY      Family History  Problem Relation Age of Onset   Hypertension Other    Hypertension Mother    Stroke Father    Colon cancer Neg Hx    Colon polyps Neg Hx    Esophageal cancer Neg Hx    Stomach cancer Neg Hx    Rectal cancer Neg Hx     Social History   Socioeconomic History   Marital status: Married    Spouse name: Not on file   Number of children: Not on file   Years of education: Not on file   Highest education level: Not on file  Occupational History   Not on file  Tobacco Use   Smoking status: Never   Smokeless tobacco: Never  Vaping Use   Vaping Use: Never used  Substance and Sexual Activity   Alcohol use: Yes    Comment: occ   Drug use: No   Sexual activity: Not on file  Other Topics Concern   Not on file  Social History Narrative   Not on file   Social Determinants of Health   Financial Resource Strain: Low Risk    Difficulty of Paying Living Expenses: Not hard at all  Food Insecurity: No Food Insecurity   Worried About Running Out of Food in the Last Year: Never true   Ran Out of  Food in the Last Year: Never true  Transportation Needs: No Transportation Needs   Lack of Transportation (Medical): No   Lack of Transportation (Non-Medical): No  Physical Activity: Sufficiently Active   Days of Exercise per Week: 7 days   Minutes of Exercise per Session: 60 min  Stress: No Stress Concern Present   Feeling of Stress : Not at all  Social Connections: Socially Integrated   Frequency of Communication with Friends and Family: Twice a week   Frequency of Social Gatherings with Friends and Family: Twice a week   Attends Religious Services: More than 4 times per year   Active Member of [employer] or Organizations: Yes   Attends Music therapist: More than 4 times per year   Marital Status: Married  Human resources officer Violence: Not At Risk   Fear of Current or Ex-Partner: No    Emotionally Abused: No   Physically Abused: No   Sexually Abused: No    Outpatient Medications Prior to Visit  Medication Sig Dispense Refill   amLODipine (NORVASC) 10 MG tablet Take 1 tablet (10 mg total) by mouth daily. 90 tablet 3   aspirin EC 81 MG tablet Take 81 mg by mouth daily. Swallow whole.     atorvastatin (LIPITOR) 20 MG tablet Take 1 tablet (20 mg total) by mouth daily. 90 tablet 3   metFORMIN (GLUCOPHAGE) 500 MG tablet TAKE 1 TABLET(500 MG) BY MOUTH TWICE DAILY WITH A MEAL 180 tablet 1   olmesartan-hydrochlorothiazide (BENICAR HCT) 40-12.5 MG tablet Take 1 tablet by mouth daily. 90 tablet 3   triamcinolone (NASACORT) 55 MCG/ACT AERO nasal inhaler Place 2 sprays into the nose daily. 2 sprays each nostril at night 1 each 12   No facility-administered medications prior to visit.    Allergies  Allergen Reactions   Ace Inhibitors Cough    ROS Review of Systems  Constitutional:  Negative for appetite change, fatigue and unexpected weight change.  Eyes:  Negative for visual disturbance.  Respiratory:  Negative for cough, chest tightness and shortness of breath.   Cardiovascular:  Negative for chest pain, palpitations and leg swelling.  Neurological:  Negative for dizziness, syncope, light-headedness and headaches.       See HPI     Objective:    Physical Exam Constitutional:      Appearance: He is well-developed.  HENT:     Right Ear: External ear normal.     Left Ear: External ear normal.  Eyes:     Pupils: Pupils are equal, round, and reactive to light.  Neck:     Thyroid: No thyromegaly.  Cardiovascular:     Rate and Rhythm: Normal rate and regular rhythm.  Pulmonary:     Effort: Pulmonary effort is normal. No respiratory distress.     Breath sounds: Normal breath sounds. No wheezing or rales.  Musculoskeletal:     Cervical back: Neck supple.     Right lower leg: No edema.     Left lower leg: No edema.  Neurological:     Mental Status: He is alert and  oriented to person, place, and time.     Cranial Nerves: No cranial nerve deficit.     Comments: He does have some mild weakness left upper extremity and left lower extremity but ambulating without difficulty and able to transfer from chair to table without assistance.    BP 130/72 (BP Location: Left Arm, Patient Position: Sitting, Cuff Size: Normal)    Pulse 78  Temp 98.3 F (36.8 C) (Oral)    Wt 180 lb (81.6 kg)    SpO2 93%    BMI 27.78 kg/m  Wt Readings from Last 3 Encounters:  11/07/21 180 lb (81.6 kg)  10/26/21 181 lb 1.6 oz (82.1 kg)  09/28/21 183 lb 8 oz (83.2 kg)     Health Maintenance Due  Topic Date Due   FOOT EXAM  Never done   Zoster Vaccines- Shingrix (1 of 2) Never done   COVID-19 Vaccine (3 - Booster for Moderna series) 04/10/2020    There are no preventive care reminders to display for this patient.  Lab Results  Component Value Date   TSH 1.25 08/08/2021   Lab Results  Component Value Date   WBC 4.6 08/08/2021   HGB 13.4 08/08/2021   HCT 39.6 08/08/2021   MCV 91.3 08/08/2021   PLT 251.0 08/08/2021   Lab Results  Component Value Date   NA 137 08/08/2021   K 4.1 08/08/2021   CO2 31 08/08/2021   GLUCOSE 103 (H) 08/08/2021   BUN 13 08/08/2021   CREATININE 1.29 08/08/2021   BILITOT 0.6 08/08/2021   ALKPHOS 64 08/08/2021   AST 14 08/08/2021   ALT 23 08/08/2021   PROT 7.3 08/08/2021   ALBUMIN 4.4 08/08/2021   CALCIUM 9.6 08/08/2021   GFR 56.88 (L) 08/08/2021   Lab Results  Component Value Date   CHOL 217 (H) 08/08/2021   Lab Results  Component Value Date   HDL 55.90 08/08/2021   Lab Results  Component Value Date   LDLCALC 143 (H) 08/08/2021   Lab Results  Component Value Date   TRIG 89.0 08/08/2021   Lab Results  Component Value Date   CHOLHDL 4 08/08/2021   Lab Results  Component Value Date   HGBA1C 6.6 (H) 08/09/2021      Assessment & Plan:   Recent acute CVA as above.  We recommend the following.  Risk factors including  age, diabetes, hypertension, hyperlipidemia.  -Continue aspirin 81 mg -Get back on Lipitor which he has not been taking recently.  We discussed higher dose Lipitor. -Discussed goals of treatment including LDL less than 70, A1c ideally less than 6.5%, blood pressure goal less than 130/80.  He has no history of tobacco use. -Recommend outpatient PT and OT.  Referral has been made for that from his hospitalization and he is waiting to hear back regarding that. -Follow-up immediately for any new neurologic symptoms. -Recommend follow-up for repeat fasting lipids and hepatic panel in about 2 months  No orders of the defined types were placed in this encounter.   Follow-up: Return in about 2 months (around 01/08/2022).    Carolann Littler, MD

## 2021-11-16 DIAGNOSIS — I639 Cerebral infarction, unspecified: Secondary | ICD-10-CM | POA: Diagnosis not present

## 2021-11-16 DIAGNOSIS — R262 Difficulty in walking, not elsewhere classified: Secondary | ICD-10-CM | POA: Diagnosis not present

## 2021-11-16 DIAGNOSIS — R2681 Unsteadiness on feet: Secondary | ICD-10-CM | POA: Diagnosis not present

## 2021-11-16 DIAGNOSIS — R2689 Other abnormalities of gait and mobility: Secondary | ICD-10-CM | POA: Diagnosis not present

## 2021-11-16 DIAGNOSIS — I69398 Other sequelae of cerebral infarction: Secondary | ICD-10-CM | POA: Diagnosis not present

## 2021-11-16 DIAGNOSIS — R531 Weakness: Secondary | ICD-10-CM | POA: Diagnosis not present

## 2021-11-16 DIAGNOSIS — Z7409 Other reduced mobility: Secondary | ICD-10-CM | POA: Diagnosis not present

## 2021-11-16 DIAGNOSIS — R279 Unspecified lack of coordination: Secondary | ICD-10-CM | POA: Diagnosis not present

## 2021-11-21 DIAGNOSIS — R2689 Other abnormalities of gait and mobility: Secondary | ICD-10-CM | POA: Diagnosis not present

## 2021-11-21 DIAGNOSIS — R2681 Unsteadiness on feet: Secondary | ICD-10-CM | POA: Diagnosis not present

## 2021-11-21 DIAGNOSIS — Z7409 Other reduced mobility: Secondary | ICD-10-CM | POA: Diagnosis not present

## 2021-11-21 DIAGNOSIS — R262 Difficulty in walking, not elsewhere classified: Secondary | ICD-10-CM | POA: Diagnosis not present

## 2021-11-21 DIAGNOSIS — I69398 Other sequelae of cerebral infarction: Secondary | ICD-10-CM | POA: Diagnosis not present

## 2021-11-21 DIAGNOSIS — R531 Weakness: Secondary | ICD-10-CM | POA: Diagnosis not present

## 2021-11-21 DIAGNOSIS — R279 Unspecified lack of coordination: Secondary | ICD-10-CM | POA: Diagnosis not present

## 2021-11-21 DIAGNOSIS — I639 Cerebral infarction, unspecified: Secondary | ICD-10-CM | POA: Diagnosis not present

## 2021-11-23 ENCOUNTER — Telehealth: Payer: Self-pay

## 2021-11-23 NOTE — Telephone Encounter (Signed)
Transition Care Management Unsuccessful Follow-up Telephone Call  Date of discharge and from where:  11/01/2021 [facility]  Attempts:  1st Attempt  Reason for unsuccessful TCM follow-up call:  No answer/busy Tomasa Rand, RN, BSN, CEN [place] Coordinator [phone]

## 2021-11-27 DIAGNOSIS — Z7409 Other reduced mobility: Secondary | ICD-10-CM | POA: Diagnosis not present

## 2021-11-27 DIAGNOSIS — R279 Unspecified lack of coordination: Secondary | ICD-10-CM | POA: Diagnosis not present

## 2021-11-27 DIAGNOSIS — I639 Cerebral infarction, unspecified: Secondary | ICD-10-CM | POA: Diagnosis not present

## 2021-11-27 DIAGNOSIS — I69398 Other sequelae of cerebral infarction: Secondary | ICD-10-CM | POA: Diagnosis not present

## 2021-11-27 DIAGNOSIS — R531 Weakness: Secondary | ICD-10-CM | POA: Diagnosis not present

## 2021-11-29 ENCOUNTER — Telehealth: Payer: Self-pay

## 2021-11-29 NOTE — Telephone Encounter (Signed)
Transition Care Management Unsuccessful Follow-up Telephone Call  Date of discharge and from where:  11/01/2021  [facility]  Attempts:  2nd Attempt  Reason for unsuccessful TCM follow-up call:  No answer/busy Tomasa Rand, RN, BSN, CEN [place] Coordinator [phone]

## 2021-11-30 DIAGNOSIS — I639 Cerebral infarction, unspecified: Secondary | ICD-10-CM | POA: Diagnosis not present

## 2021-11-30 DIAGNOSIS — I10 Essential (primary) hypertension: Secondary | ICD-10-CM | POA: Diagnosis not present

## 2021-11-30 DIAGNOSIS — E119 Type 2 diabetes mellitus without complications: Secondary | ICD-10-CM | POA: Diagnosis not present

## 2021-11-30 DIAGNOSIS — E785 Hyperlipidemia, unspecified: Secondary | ICD-10-CM | POA: Diagnosis not present

## 2021-11-30 DIAGNOSIS — I2510 Atherosclerotic heart disease of native coronary artery without angina pectoris: Secondary | ICD-10-CM | POA: Diagnosis not present

## 2021-11-30 DIAGNOSIS — Z7982 Long term (current) use of aspirin: Secondary | ICD-10-CM | POA: Diagnosis not present

## 2021-11-30 DIAGNOSIS — Z79899 Other long term (current) drug therapy: Secondary | ICD-10-CM | POA: Diagnosis not present

## 2021-11-30 DIAGNOSIS — I4891 Unspecified atrial fibrillation: Secondary | ICD-10-CM | POA: Diagnosis not present

## 2021-12-04 DIAGNOSIS — R279 Unspecified lack of coordination: Secondary | ICD-10-CM | POA: Diagnosis not present

## 2021-12-04 DIAGNOSIS — I639 Cerebral infarction, unspecified: Secondary | ICD-10-CM | POA: Diagnosis not present

## 2021-12-04 DIAGNOSIS — I69398 Other sequelae of cerebral infarction: Secondary | ICD-10-CM | POA: Diagnosis not present

## 2021-12-04 DIAGNOSIS — R531 Weakness: Secondary | ICD-10-CM | POA: Diagnosis not present

## 2021-12-04 DIAGNOSIS — Z7409 Other reduced mobility: Secondary | ICD-10-CM | POA: Diagnosis not present

## 2021-12-11 DIAGNOSIS — R531 Weakness: Secondary | ICD-10-CM | POA: Diagnosis not present

## 2021-12-11 DIAGNOSIS — R279 Unspecified lack of coordination: Secondary | ICD-10-CM | POA: Diagnosis not present

## 2021-12-11 DIAGNOSIS — I639 Cerebral infarction, unspecified: Secondary | ICD-10-CM | POA: Diagnosis not present

## 2021-12-11 DIAGNOSIS — Z7409 Other reduced mobility: Secondary | ICD-10-CM | POA: Diagnosis not present

## 2021-12-11 DIAGNOSIS — I69398 Other sequelae of cerebral infarction: Secondary | ICD-10-CM | POA: Diagnosis not present

## 2021-12-19 ENCOUNTER — Ambulatory Visit (INDEPENDENT_AMBULATORY_CARE_PROVIDER_SITE_OTHER): Payer: Medicare Other | Admitting: Family Medicine

## 2021-12-19 VITALS — BP 170/90 | HR 76 | Temp 98.0°F | Wt 187.3 lb

## 2021-12-19 DIAGNOSIS — E785 Hyperlipidemia, unspecified: Secondary | ICD-10-CM

## 2021-12-19 DIAGNOSIS — Z8673 Personal history of transient ischemic attack (TIA), and cerebral infarction without residual deficits: Secondary | ICD-10-CM | POA: Diagnosis not present

## 2021-12-19 DIAGNOSIS — E1165 Type 2 diabetes mellitus with hyperglycemia: Secondary | ICD-10-CM | POA: Diagnosis not present

## 2021-12-19 DIAGNOSIS — I10 Essential (primary) hypertension: Secondary | ICD-10-CM

## 2021-12-19 LAB — LIPID PANEL
Cholesterol: 114 mg/dL (ref 0–200)
HDL: 47.3 mg/dL (ref >39.00)
LDL Cholesterol: 41 mg/dL (ref 0–99)
NonHDL: 66.6
Total CHOL/HDL Ratio: 2
Triglycerides: 130 mg/dL (ref 0.0–149.0)
VLDL: 26 mg/dL (ref 0.0–40.0)

## 2021-12-19 LAB — HEPATIC FUNCTION PANEL
ALT: 25 U/L (ref 0–53)
AST: 17 U/L (ref 0–37)
Albumin: 4.4 g/dL (ref 3.5–5.2)
Alkaline Phosphatase: 65 U/L (ref 39–117)
Bilirubin, Direct: 0.1 mg/dL (ref 0.0–0.3)
Total Bilirubin: 0.5 mg/dL (ref 0.2–1.2)
Total Protein: 7.5 g/dL (ref 6.0–8.3)

## 2021-12-19 LAB — HEMOGLOBIN A1C: Hgb A1c MFr Bld: 6.6 % — ABNORMAL HIGH (ref 4.6–6.5)

## 2021-12-19 MED ORDER — HYDRALAZINE HCL 25 MG PO TABS: 25.0000 mg | ORAL_TABLET | Freq: Two times a day (BID) | ORAL | 5 refills | Status: DC

## 2021-12-19 MED ORDER — METFORMIN HCL 500 MG PO TABS: ORAL_TABLET | ORAL | 3 refills | Status: DC

## 2021-12-19 NOTE — Patient Instructions (Signed)
Start the Hydralazine 25 mg twice daily  Start back gradually with exercise  Goal BP < 130/80,  A1C < 6.5,  LDL cholesterol < 70.

## 2021-12-19 NOTE — Progress Notes (Signed)
Established Patient Office Visit  Subjective:  Patient ID: Jon Moses, male    DOB: 03-07-1951  Age: 71 y.o. MRN: [id number]  CC:  Chief Complaint  Patient presents with   Follow-up    HPI Jon Moses presents for follow-up from recent CVA.  He was admitted over at [facility].  He is continue to slowly recover.  His MRI at the time of his stroke showed early/subacute infarct right paramedian pons as well as additional punctate acute subacute infarct of the right parieto-occipital region.  He is started back slowly with exercise.  He is on Benicar HCTZ 40/12.5 mg daily and amlodipine 10 mg daily.  His atorvastatin was increased to 40 mg daily.  He remains on aspirin.  Home blood pressures have been consistently around 140s to 80s and sometimes 381 systolic.  He is on metformin for blood sugar.  Diabetes generally well controlled.  Has not had follow-up lipids since his stroke.  Past Medical History:  Diagnosis Date   Diabetes mellitus without complication ([place])    Hyperlipidemia 03/26/2011   HYPERTENSION 07/17/2010    Past Surgical History:  Procedure Laterality Date   COLONOSCOPY     11 yrs ago from [place]   left   LIPOMA EXCISION Left 12/24/2013   Procedure: EXCISION MASS LEFT BACK;  Surgeon: Zenovia Jarred, MD;  Location: [place];  Service: General;  Laterality: Left;   TONSILLECTOMY      Family History  Problem Relation Age of Onset   Hypertension Other    Hypertension Mother    Stroke Father    Colon cancer Neg Hx    Colon polyps Neg Hx    Esophageal cancer Neg Hx    Stomach cancer Neg Hx    Rectal cancer Neg Hx     Social History   Socioeconomic History   Marital status: Married    Spouse name: Not on file   Number of children: Not on file   Years of education: Not on file   Highest education level: Not on file  Occupational History   Not on file  Tobacco Use   Smoking status: Never   Smokeless tobacco:  Never  Vaping Use   Vaping Use: Never used  Substance and Sexual Activity   Alcohol use: Yes    Comment: occ   Drug use: No   Sexual activity: Not on file  Other Topics Concern   Not on file  Social History Narrative   Not on file   Social Determinants of Health   Financial Resource Strain: Low Risk    Difficulty of Paying Living Expenses: Not hard at all  Food Insecurity: No Food Insecurity   Worried About Charity fundraiser in the Last Year: Never true   Ran Out of Food in the Last Year: Never true  Transportation Needs: No Transportation Needs   Lack of Transportation (Medical): No   Lack of Transportation (Non-Medical): No  Physical Activity: Sufficiently Active   Days of Exercise per Week: 7 days   Minutes of Exercise per Session: 60 min  Stress: No Stress Concern Present   Feeling of Stress : Not at all  Social Connections: Socially Integrated   Frequency of Communication with Friends and Family: Twice a week   Frequency of Social Gatherings with Friends and Family: Twice a week   Attends Religious Services: More than 4 times per year   Active Member  of Clubs or Organizations: Yes   Attends Music therapist: More than 4 times per year   Marital Status: Married  Human resources officer Violence: Not At Risk   Fear of Current or Ex-Partner: No   Emotionally Abused: No   Physically Abused: No   Sexually Abused: No    Outpatient Medications Prior to Visit  Medication Sig Dispense Refill   amLODipine (NORVASC) 10 MG tablet Take 1 tablet (10 mg total) by mouth daily. 90 tablet 3   aspirin EC 81 MG tablet Take 81 mg by mouth daily. Swallow whole.     atorvastatin (LIPITOR) 40 MG tablet Take 40 mg by mouth daily.     olmesartan-hydrochlorothiazide (BENICAR HCT) 40-12.5 MG tablet Take 1 tablet by mouth daily. 90 tablet 3   triamcinolone (NASACORT) 55 MCG/ACT AERO nasal inhaler Place 2 sprays into the nose daily. 2 sprays each nostril at night 1 each 12    atorvastatin (LIPITOR) 20 MG tablet Take 1 tablet (20 mg total) by mouth daily. 90 tablet 3   metFORMIN (GLUCOPHAGE) 500 MG tablet TAKE 1 TABLET(500 MG) BY MOUTH TWICE DAILY WITH A MEAL 180 tablet 1   No facility-administered medications prior to visit.    Allergies  Allergen Reactions   Ace Inhibitors Cough    ROS Review of Systems  Constitutional:  Negative for fatigue and unexpected weight change.  Eyes:  Negative for visual disturbance.  Respiratory:  Negative for cough, chest tightness and shortness of breath.   Cardiovascular:  Negative for chest pain, palpitations and leg swelling.  Endocrine: Negative for polydipsia and polyuria.  Neurological:  Negative for dizziness, syncope, weakness, light-headedness and headaches.     Objective:    Physical Exam Constitutional:      Appearance: He is well-developed.  HENT:     Right Ear: External ear normal.     Left Ear: External ear normal.  Neck:     Thyroid: No thyromegaly.  Cardiovascular:     Rate and Rhythm: Normal rate and regular rhythm.  Pulmonary:     Effort: Pulmonary effort is normal. No respiratory distress.     Breath sounds: Normal breath sounds. No wheezing or rales.  Musculoskeletal:     Cervical back: Neck supple.     Right lower leg: No edema.     Left lower leg: No edema.  Neurological:     Mental Status: He is alert and oriented to person, place, and time.    BP (!) 170/90 (BP Location: Left Arm, Patient Position: Sitting, Cuff Size: Normal)    Pulse 76    Temp 98 F (36.7 C) (Oral)    Wt 187 lb 4.8 oz (85 kg)    SpO2 98%    BMI 28.90 kg/m  Wt Readings from Last 3 Encounters:  12/19/21 187 lb 4.8 oz (85 kg)  11/07/21 180 lb (81.6 kg)  10/26/21 181 lb 1.6 oz (82.1 kg)     Health Maintenance Due  Topic Date Due   FOOT EXAM  Never done   Zoster Vaccines- Shingrix (1 of 2) Never done   COVID-19 Vaccine (3 - Booster for Moderna series) 04/10/2020    There are no preventive care reminders to  display for this patient.  Lab Results  Component Value Date   TSH 1.25 08/08/2021   Lab Results  Component Value Date   WBC 4.6 08/08/2021   HGB 13.4 08/08/2021   HCT 39.6 08/08/2021   MCV 91.3 08/08/2021   PLT 251.0  08/08/2021   Lab Results  Component Value Date   NA 137 08/08/2021   K 4.1 08/08/2021   CO2 31 08/08/2021   GLUCOSE 103 (H) 08/08/2021   BUN 13 08/08/2021   CREATININE 1.29 08/08/2021   BILITOT 0.6 08/08/2021   ALKPHOS 64 08/08/2021   AST 14 08/08/2021   ALT 23 08/08/2021   PROT 7.3 08/08/2021   ALBUMIN 4.4 08/08/2021   CALCIUM 9.6 08/08/2021   GFR 56.88 (L) 08/08/2021   Lab Results  Component Value Date   CHOL 217 (H) 08/08/2021   Lab Results  Component Value Date   HDL 55.90 08/08/2021   Lab Results  Component Value Date   LDLCALC 143 (H) 08/08/2021   Lab Results  Component Value Date   TRIG 89.0 08/08/2021   Lab Results  Component Value Date   CHOLHDL 4 08/08/2021   Lab Results  Component Value Date   HGBA1C 6.6 (H) 08/09/2021      Assessment & Plan:   #1 recent acute CVA.  We discussed secondary prevention goals of blood pressure 130/80 or less, A1c 6.5% or less and LDL cholesterol ideally less than 55. Continue aspirin 1 daily  #2 type 2 diabetes.  History of good control.  Recheck A1c today.  Refill metformin for 1 year  #3 dyslipidemia.  Recheck lipids today.  Recent increase of atorvastatin to 40 mg daily  #4 hypertension suboptimally controlled.  He is several weeks out from stroke and still having multiple readings over 979G systolic.  Already on 10 mg amlodipine and Benicar 40 mg.  Add hydralazine 25 mg twice daily.  Continue close monitoring.  Set up 6-week follow-up to reassess.  Bring log of readings at that time   Meds ordered this encounter  Medications   metFORMIN (GLUCOPHAGE) 500 MG tablet    Sig: Take one tablet by mouth twice daily    Dispense:  180 tablet    Refill:  3    REFILLS NEEDED   hydrALAZINE  (APRESOLINE) 25 MG tablet    Sig: Take 1 tablet (25 mg total) by mouth 2 (two) times daily.    Dispense:  60 tablet    Refill:  5    Follow-up: Return in about 6 weeks (around 01/30/2022).    Carolann Littler, MD

## 2021-12-20 DIAGNOSIS — Z7409 Other reduced mobility: Secondary | ICD-10-CM | POA: Diagnosis not present

## 2021-12-20 DIAGNOSIS — R531 Weakness: Secondary | ICD-10-CM | POA: Diagnosis not present

## 2021-12-20 DIAGNOSIS — I639 Cerebral infarction, unspecified: Secondary | ICD-10-CM | POA: Diagnosis not present

## 2021-12-21 ENCOUNTER — Telehealth: Payer: Self-pay | Admitting: Family Medicine

## 2021-12-21 NOTE — Telephone Encounter (Signed)
Patient called because he would like another call back so he can write his results down for his records. Patient does not have mychart set up.     Please advise

## 2021-12-21 NOTE — Telephone Encounter (Signed)
Spoke with the patient. We discussed the results again. Nothing further needed.

## 2021-12-27 DIAGNOSIS — Z7409 Other reduced mobility: Secondary | ICD-10-CM | POA: Diagnosis not present

## 2021-12-27 DIAGNOSIS — I639 Cerebral infarction, unspecified: Secondary | ICD-10-CM | POA: Diagnosis not present

## 2021-12-27 DIAGNOSIS — R531 Weakness: Secondary | ICD-10-CM | POA: Diagnosis not present

## 2021-12-28 DIAGNOSIS — H6122 Impacted cerumen, left ear: Secondary | ICD-10-CM | POA: Diagnosis not present

## 2021-12-31 DIAGNOSIS — I639 Cerebral infarction, unspecified: Secondary | ICD-10-CM | POA: Diagnosis not present

## 2021-12-31 DIAGNOSIS — I6389 Other cerebral infarction: Secondary | ICD-10-CM | POA: Diagnosis not present

## 2021-12-31 DIAGNOSIS — Z87891 Personal history of nicotine dependence: Secondary | ICD-10-CM | POA: Diagnosis not present

## 2022-01-22 ENCOUNTER — Encounter: Payer: Self-pay | Admitting: Family Medicine

## 2022-01-22 ENCOUNTER — Other Ambulatory Visit: Payer: Self-pay

## 2022-01-22 ENCOUNTER — Ambulatory Visit (INDEPENDENT_AMBULATORY_CARE_PROVIDER_SITE_OTHER): Payer: Medicare Other | Admitting: Family Medicine

## 2022-01-22 VITALS — BP 152/92 | HR 78 | Temp 98.4°F | Ht 67.5 in | Wt 186.1 lb

## 2022-01-22 DIAGNOSIS — I10 Essential (primary) hypertension: Secondary | ICD-10-CM | POA: Diagnosis not present

## 2022-01-22 DIAGNOSIS — Z8673 Personal history of transient ischemic attack (TIA), and cerebral infarction without residual deficits: Secondary | ICD-10-CM

## 2022-01-22 DIAGNOSIS — E785 Hyperlipidemia, unspecified: Secondary | ICD-10-CM

## 2022-01-22 DIAGNOSIS — E1165 Type 2 diabetes mellitus with hyperglycemia: Secondary | ICD-10-CM | POA: Diagnosis not present

## 2022-01-22 MED ORDER — SPIRONOLACTONE 25 MG PO TABS: 25.0000 mg | ORAL_TABLET | Freq: Every day | ORAL | 5 refills | Status: DC

## 2022-01-22 NOTE — Progress Notes (Signed)
Established Patient Office Visit  Subjective:  Patient ID: Jon Moses, male    DOB: 03-06-1951  Age: 71 y.o. MRN: [id number]  CC:  Chief Complaint  Patient presents with   Follow-up    HPI Jon Moses presents for hypertension follow-up.  Refer to last note from January for details.  He had CVA back in December.  He had evidence for early/subacute infarct right paramedian pons as well as additional punctate acute subacute infarct of the right parieto-occipital region.  Mild left-sided residual weakness.  No speech impairment.  Atorvastatin increased to 40 mg and lipids back in January much improved and at goal.  Last A1c 6.6%.  He brings in a long log of blood pressure readings and these have been suboptimally controlled.  Had occasional readings 308M systolic and 57Q to 46N diastolic but mostly 629B to 284X systolic and 32G to 40N diastolic.  No headaches.  No dizziness.  He is currently on amlodipine 10 mg daily, Benicar HCTZ 40/12.5 mg daily, hydralazine 25 mg twice daily which was added last visit.  Compliant with medications.  Trying to watch salt intake closely.  No alcohol use.  Non-smoker.  No new neurologic symptoms.  Past Medical History:  Diagnosis Date   Diabetes mellitus without complication ([place])    Hyperlipidemia 03/26/2011   HYPERTENSION 07/17/2010    Past Surgical History:  Procedure Laterality Date   COLONOSCOPY     11 yrs ago from [place]   left   LIPOMA EXCISION Left 12/24/2013   Procedure: EXCISION MASS LEFT BACK;  Surgeon: Zenovia Jarred, MD;  Location: [place];  Service: General;  Laterality: Left;   TONSILLECTOMY      Family History  Problem Relation Age of Onset   Hypertension Other    Hypertension Mother    Stroke Father    Colon cancer Neg Hx    Colon polyps Neg Hx    Esophageal cancer Neg Hx    Stomach cancer Neg Hx    Rectal cancer Neg Hx     Social History   Socioeconomic History   Marital  status: Married    Spouse name: Not on file   Number of children: Not on file   Years of education: Not on file   Highest education level: Not on file  Occupational History   Not on file  Tobacco Use   Smoking status: Never   Smokeless tobacco: Never  Vaping Use   Vaping Use: Never used  Substance and Sexual Activity   Alcohol use: Yes    Comment: occ   Drug use: No   Sexual activity: Not on file  Other Topics Concern   Not on file  Social History Narrative   Not on file   Social Determinants of Health   Financial Resource Strain: Low Risk    Difficulty of Paying Living Expenses: Not hard at all  Food Insecurity: No Food Insecurity   Worried About Charity fundraiser in the Last Year: Never true   Ran Out of Food in the Last Year: Never true  Transportation Needs: No Transportation Needs   Lack of Transportation (Medical): No   Lack of Transportation (Non-Medical): No  Physical Activity: Sufficiently Active   Days of Exercise per Week: 7 days   Minutes of Exercise per Session: 60 min  Stress: No Stress Concern Present   Feeling of Stress : Not at all  Social Connections: Socially  Integrated   Frequency of Communication with Friends and Family: Twice a week   Frequency of Social Gatherings with Friends and Family: Twice a week   Attends Religious Services: More than 4 times per year   Active Member of [employer] or Organizations: Yes   Attends Music therapist: More than 4 times per year   Marital Status: Married  Human resources officer Violence: Not At Risk   Fear of Current or Ex-Partner: No   Emotionally Abused: No   Physically Abused: No   Sexually Abused: No    Outpatient Medications Prior to Visit  Medication Sig Dispense Refill   amLODipine (NORVASC) 10 MG tablet Take 1 tablet (10 mg total) by mouth daily. 90 tablet 3   aspirin EC 81 MG tablet Take 81 mg by mouth daily. Swallow whole.     atorvastatin (LIPITOR) 40 MG tablet Take 40 mg by mouth daily.      hydrALAZINE (APRESOLINE) 25 MG tablet Take 1 tablet (25 mg total) by mouth 2 (two) times daily. 60 tablet 5   metFORMIN (GLUCOPHAGE) 500 MG tablet Take one tablet by mouth twice daily 180 tablet 3   olmesartan-hydrochlorothiazide (BENICAR HCT) 40-12.5 MG tablet Take 1 tablet by mouth daily. 90 tablet 3   triamcinolone (NASACORT) 55 MCG/ACT AERO nasal inhaler Place 2 sprays into the nose daily. 2 sprays each nostril at night 1 each 12   No facility-administered medications prior to visit.    Allergies  Allergen Reactions   Ace Inhibitors Cough    ROS Review of Systems  Constitutional:  Negative for fatigue and unexpected weight change.  Eyes:  Negative for visual disturbance.  Respiratory:  Negative for cough, chest tightness and shortness of breath.   Cardiovascular:  Negative for chest pain, palpitations and leg swelling.  Neurological:  Negative for dizziness, syncope, weakness, light-headedness and headaches.     Objective:    Physical Exam Constitutional:      Appearance: He is well-developed.  Eyes:     Pupils: Pupils are equal, round, and reactive to light.  Neck:     Thyroid: No thyromegaly.  Cardiovascular:     Rate and Rhythm: Normal rate and regular rhythm.  Pulmonary:     Effort: Pulmonary effort is normal. No respiratory distress.     Breath sounds: Normal breath sounds. No wheezing or rales.  Musculoskeletal:     Cervical back: Neck supple.     Right lower leg: No edema.     Left lower leg: No edema.  Neurological:     Mental Status: He is alert and oriented to person, place, and time.    BP (!) 152/92 (BP Location: Left Arm, Patient Position: Sitting, Cuff Size: Normal)    Pulse 78    Temp 98.4 F (36.9 C) (Oral)    Ht 5' 7.5" (1.715 m)    Wt 186 lb 1.6 oz (84.4 kg)    SpO2 96%    BMI 28.72 kg/m  Wt Readings from Last 3 Encounters:  01/22/22 186 lb 1.6 oz (84.4 kg)  12/19/21 187 lb 4.8 oz (85 kg)  11/07/21 180 lb (81.6 kg)     Health Maintenance  Due  Topic Date Due   FOOT EXAM  Never done   Zoster Vaccines- Shingrix (1 of 2) Never done   COVID-19 Vaccine (3 - Booster for Moderna series) 04/10/2020    There are no preventive care reminders to display for this patient.  Lab Results  Component Value Date  TSH 1.25 08/08/2021   Lab Results  Component Value Date   WBC 4.6 08/08/2021   HGB 13.4 08/08/2021   HCT 39.6 08/08/2021   MCV 91.3 08/08/2021   PLT 251.0 08/08/2021   Lab Results  Component Value Date   NA 137 08/08/2021   K 4.1 08/08/2021   CO2 31 08/08/2021   GLUCOSE 103 (H) 08/08/2021   BUN 13 08/08/2021   CREATININE 1.29 08/08/2021   BILITOT 0.5 12/19/2021   ALKPHOS 65 12/19/2021   AST 17 12/19/2021   ALT 25 12/19/2021   PROT 7.5 12/19/2021   ALBUMIN 4.4 12/19/2021   CALCIUM 9.6 08/08/2021   GFR 56.88 (L) 08/08/2021   Lab Results  Component Value Date   CHOL 114 12/19/2021   Lab Results  Component Value Date   HDL 47.30 12/19/2021   Lab Results  Component Value Date   LDLCALC 41 12/19/2021   Lab Results  Component Value Date   TRIG 130.0 12/19/2021   Lab Results  Component Value Date   CHOLHDL 2 12/19/2021   Lab Results  Component Value Date   HGBA1C 6.6 (H) 12/19/2021      Assessment & Plan:   #1 hypertension.  Suboptimal control.  Goal less than 130/80.  Currently on amlodipine, Benicar HCTZ, hydralazine and not to goal.  We did discuss option of increasing hydralazine but discussed option of adding Aldactone 25 mg daily.  Hopefully will get additional benefit of aldosterone antagonism.  We will need to watch electrolytes closely.  -Set up 1 month follow-up and check basic metabolic panel then -Try to get back to more consistent exercise -Continue to keep sodium intake less than 2500 mg daily  #2 type 2 diabetes.  History of reasonable control.  Last A1c 6.6%.  Recheck A1c at follow-up  #3 hyperlipidemia.  Goal LDL less than 70.  Recent LDL cholesterol at goal and now on  high-dose Lipitor.  Tolerating well.  Meds ordered this encounter  Medications   spironolactone (ALDACTONE) 25 MG tablet    Sig: Take 1 tablet (25 mg total) by mouth daily.    Dispense:  30 tablet    Refill:  5    Follow-up: Return in about 1 month (around 02/19/2022).    Carolann Littler, MD

## 2022-01-22 NOTE — Patient Instructions (Signed)
Add the Aldactone 25 mg once daily  Continue all of your other BP medications  Get back to more consistent exercise  Set up one month follow up.Marland Kitchen

## 2022-01-23 DIAGNOSIS — I639 Cerebral infarction, unspecified: Secondary | ICD-10-CM | POA: Diagnosis not present

## 2022-01-23 DIAGNOSIS — I471 Supraventricular tachycardia: Secondary | ICD-10-CM | POA: Diagnosis not present

## 2022-01-27 DIAGNOSIS — I471 Supraventricular tachycardia: Secondary | ICD-10-CM | POA: Diagnosis not present

## 2022-01-30 ENCOUNTER — Ambulatory Visit: Payer: Medicare Other | Admitting: Family Medicine

## 2022-01-30 DIAGNOSIS — Z79899 Other long term (current) drug therapy: Secondary | ICD-10-CM | POA: Diagnosis not present

## 2022-01-30 DIAGNOSIS — I639 Cerebral infarction, unspecified: Secondary | ICD-10-CM | POA: Diagnosis not present

## 2022-01-30 DIAGNOSIS — Z7982 Long term (current) use of aspirin: Secondary | ICD-10-CM | POA: Diagnosis not present

## 2022-01-30 DIAGNOSIS — E119 Type 2 diabetes mellitus without complications: Secondary | ICD-10-CM | POA: Diagnosis not present

## 2022-02-19 ENCOUNTER — Encounter: Payer: Self-pay | Admitting: Family Medicine

## 2022-02-19 ENCOUNTER — Ambulatory Visit (INDEPENDENT_AMBULATORY_CARE_PROVIDER_SITE_OTHER): Payer: Medicare Other | Admitting: Family Medicine

## 2022-02-19 VITALS — BP 138/80 | HR 83 | Temp 97.9°F | Ht 67.5 in | Wt 187.4 lb

## 2022-02-19 DIAGNOSIS — I10 Essential (primary) hypertension: Secondary | ICD-10-CM

## 2022-02-19 DIAGNOSIS — E785 Hyperlipidemia, unspecified: Secondary | ICD-10-CM | POA: Diagnosis not present

## 2022-02-19 DIAGNOSIS — E1165 Type 2 diabetes mellitus with hyperglycemia: Secondary | ICD-10-CM | POA: Diagnosis not present

## 2022-02-19 LAB — HEMOGLOBIN A1C: Hgb A1c MFr Bld: 6.8 % — ABNORMAL HIGH (ref 4.6–6.5)

## 2022-02-19 LAB — BASIC METABOLIC PANEL
BUN: 14 mg/dL (ref 6–23)
CO2: 28 mEq/L (ref 19–32)
Calcium: 9.5 mg/dL (ref 8.4–10.5)
Chloride: 100 mEq/L (ref 96–112)
Creatinine, Ser: 1.32 mg/dL (ref 0.40–1.50)
GFR: 55.12 mL/min — ABNORMAL LOW (ref >60.00)
Glucose, Bld: 107 mg/dL — ABNORMAL HIGH (ref 70–99)
Potassium: 4.3 mEq/L (ref 3.5–5.1)
Sodium: 135 mEq/L (ref 135–145)

## 2022-02-19 NOTE — Progress Notes (Signed)
? ?Established Patient Office Visit ? ?Subjective:  ?Patient ID: Jon Moses, male    DOB: 1951-07-06  Age: 71 y.o. MRN: [id number] ? ?CC:  ?Chief Complaint  ?Patient presents with  ? Follow-up  ? ? ?HPI ?Jon Moses presents for follow-up hypertension.  He had stroke back in December.  Had some mild residual left-sided weakness.  No speech impediment.  Remains on high-dose atorvastatin.  Last A1c 6.6%.  Blood pressure was not optimally controlled last visit and we added Aldactone and has tolerated with no side effects.  He also remains on amlodipine, Benicar HCTZ, and hydralazine.  Trying to watch his salt intake closely.  No alcohol.  Still not exercising regularly.  Home blood pressures have improved with consistent readings 120s to 130s.  Not monitoring blood sugars regularly. ? ?He continues to tolerate Lipitor well.  Recent LDL cholesterol improved and at goal. ? ?Past Medical History:  ?Diagnosis Date  ? Diabetes mellitus without complication ([place])   ? Hyperlipidemia 03/26/2011  ? HYPERTENSION 07/17/2010  ? ? ?Past Surgical History:  ?Procedure Laterality Date  ? COLONOSCOPY    ? 11 yrs ago from 2021  ? [place]  ? left  ? LIPOMA EXCISION Left 12/24/2013  ? Procedure: EXCISION MASS LEFT BACK;  Surgeon: Zenovia Jarred, MD;  Location: [facility];  Service: General;  Laterality: Left;  ? TONSILLECTOMY    ? ? ?Family History  ?Problem Relation Age of Onset  ? Hypertension Other   ? Hypertension Mother   ? Stroke Father   ? Colon cancer Neg Hx   ? Colon polyps Neg Hx   ? Esophageal cancer Neg Hx   ? Stomach cancer Neg Hx   ? Rectal cancer Neg Hx   ? ? ?Social History  ? ?Socioeconomic History  ? Marital status: Married  ?  Spouse name: Not on file  ? Number of children: Not on file  ? Years of education: Not on file  ? Highest education level: Not on file  ?Occupational History  ? Not on file  ?Tobacco Use  ? Smoking status: Never  ? Smokeless tobacco: Never  ?Vaping Use  ? Vaping  Use: Never used  ?Substance and Sexual Activity  ? Alcohol use: Yes  ?  Comment: occ  ? Drug use: No  ? Sexual activity: Not on file  ?Other Topics Concern  ? Not on file  ?Social History Narrative  ? Not on file  ? ?Social Determinants of Health  ? ?Financial Resource Strain: Low Risk   ? Difficulty of Paying Living Expenses: Not hard at all  ?Food Insecurity: No Food Insecurity  ? Worried About Charity fundraiser in the Last Year: Never true  ? Ran Out of Food in the Last Year: Never true  ?Transportation Needs: No Transportation Needs  ? Lack of Transportation (Medical): No  ? Lack of Transportation (Non-Medical): No  ?Physical Activity: Sufficiently Active  ? Days of Exercise per Week: 7 days  ? Minutes of Exercise per Session: 60 min  ?Stress: No Stress Concern Present  ? Feeling of Stress : Not at all  ?Social Connections: Socially Integrated  ? Frequency of Communication with Friends and Family: Twice a week  ? Frequency of Social Gatherings with Friends and Family: Twice a week  ? Attends Religious Services: More than 4 times per year  ? Active Member of Clubs or Organizations: Yes  ? Attends Archivist Meetings:  More than 4 times per year  ? Marital Status: Married  ?Intimate Partner Violence: Not At Risk  ? Fear of Current or Ex-Partner: No  ? Emotionally Abused: No  ? Physically Abused: No  ? Sexually Abused: No  ? ? ?Outpatient Medications Prior to Visit  ?Medication Sig Dispense Refill  ? amLODipine (NORVASC) 10 MG tablet Take 1 tablet (10 mg total) by mouth daily. 90 tablet 3  ? aspirin EC 81 MG tablet Take 81 mg by mouth daily. Swallow whole.    ? atorvastatin (LIPITOR) 40 MG tablet Take 40 mg by mouth daily.    ? hydrALAZINE (APRESOLINE) 25 MG tablet Take 1 tablet (25 mg total) by mouth 2 (two) times daily. 60 tablet 5  ? metFORMIN (GLUCOPHAGE) 500 MG tablet Take one tablet by mouth twice daily 180 tablet 3  ? olmesartan-hydrochlorothiazide (BENICAR HCT) 40-12.5 MG tablet Take 1 tablet by  mouth daily. 90 tablet 3  ? spironolactone (ALDACTONE) 25 MG tablet Take 1 tablet (25 mg total) by mouth daily. 30 tablet 5  ? triamcinolone (NASACORT) 55 MCG/ACT AERO nasal inhaler Place 2 sprays into the nose daily. 2 sprays each nostril at night 1 each 12  ? ?No facility-administered medications prior to visit.  ? ? ?Allergies  ?Allergen Reactions  ? Ace Inhibitors Cough  ? ? ?ROS ?Review of Systems  ?Constitutional:  Negative for fatigue and unexpected weight change.  ?Eyes:  Negative for visual disturbance.  ?Respiratory:  Negative for cough, chest tightness and shortness of breath.   ?Cardiovascular:  Negative for chest pain, palpitations and leg swelling.  ?Endocrine: Negative for polydipsia and polyuria.  ?Neurological:  Negative for dizziness, syncope, weakness, light-headedness and headaches.  ? ?  ?Objective:  ?  ?Physical Exam ?Constitutional:   ?   General: He is not in acute distress. ?   Appearance: He is well-developed.  ?Neck:  ?   Thyroid: No thyromegaly.  ?Cardiovascular:  ?   Rate and Rhythm: Normal rate and regular rhythm.  ?   Heart sounds: Normal heart sounds. No murmur heard. ?Pulmonary:  ?   Effort: Pulmonary effort is normal. No respiratory distress.  ?   Breath sounds: Normal breath sounds. No wheezing or rales.  ?Neurological:  ?   Mental Status: He is alert and oriented to person, place, and time.  ?   Cranial Nerves: No cranial nerve deficit.  ?   Deep Tendon Reflexes: Reflexes are normal and symmetric.  ? ? ?BP 138/80 (BP Location: Left Arm, Cuff Size: Normal)   Pulse 83   Temp 97.9 ?F (36.6 ?C) (Oral)   Ht 5' 7.5" (1.715 m)   Wt 187 lb 6.4 oz (85 kg)   SpO2 96%   BMI 28.92 kg/m?  ?Wt Readings from Last 3 Encounters:  ?02/19/22 187 lb 6.4 oz (85 kg)  ?01/22/22 186 lb 1.6 oz (84.4 kg)  ?12/19/21 187 lb 4.8 oz (85 kg)  ? ? ? ?Health Maintenance Due  ?Topic Date Due  ? FOOT EXAM  Never done  ? Zoster Vaccines- Shingrix (1 of 2) Never done  ? COVID-19 Vaccine (3 - Booster for Moderna  series) 04/10/2020  ? ? ?There are no preventive care reminders to display for this patient. ? ?Lab Results  ?Component Value Date  ? TSH 1.25 08/08/2021  ? ?Lab Results  ?Component Value Date  ? WBC 4.6 08/08/2021  ? HGB 13.4 08/08/2021  ? HCT 39.6 08/08/2021  ? MCV 91.3 08/08/2021  ? PLT  251.0 08/08/2021  ? ?Lab Results  ?Component Value Date  ? NA 135 02/19/2022  ? K 4.3 02/19/2022  ? CO2 28 02/19/2022  ? GLUCOSE 107 (H) 02/19/2022  ? BUN 14 02/19/2022  ? CREATININE 1.32 02/19/2022  ? BILITOT 0.5 12/19/2021  ? ALKPHOS 65 12/19/2021  ? AST 17 12/19/2021  ? ALT 25 12/19/2021  ? PROT 7.5 12/19/2021  ? ALBUMIN 4.4 12/19/2021  ? CALCIUM 9.5 02/19/2022  ? GFR 55.12 (L) 02/19/2022  ? ?Lab Results  ?Component Value Date  ? CHOL 114 12/19/2021  ? ?Lab Results  ?Component Value Date  ? HDL 47.30 12/19/2021  ? ?Lab Results  ?Component Value Date  ? [place] 41 12/19/2021  ? ?Lab Results  ?Component Value Date  ? TRIG 130.0 12/19/2021  ? ?Lab Results  ?Component Value Date  ? CHOLHDL 2 12/19/2021  ? ?Lab Results  ?Component Value Date  ? HGBA1C 6.8 (H) 02/19/2022  ? ? ?  ?Assessment & Plan:  ? ?Problem List Items Addressed This Visit   ? ?  ? Unprioritized  ? Type 2 diabetes mellitus with hyperglycemia (HCC)  ? Relevant Orders  ? Hemoglobin A1c (Completed)  ? Essential hypertension - Primary  ? Relevant Orders  ? Basic metabolic panel (Completed)  ? Hyperlipidemia  ?Hypertension improved both by his home readings and readings here today.  Improved with recent addition of Aldactone 25 mg daily. ?-Check basic metabolic panel ?-Continue low-sodium diet ?-Try to gradually progress exercise such as walking ? ?-Recheck A1c today with goal less than 6.5%. ? ?-Continue high-dose statin with Lipitor 40 mg daily.  Recent lipids at goal. ? ?-Set up routine follow-up in 6 months unless indicated by labs above or elevating blood pressures at home ? ?No orders of the defined types were placed in this encounter. ? ? ?Follow-up: Return in  about 6 months (around 08/22/2022).  ? ? ?Carolann Littler, MD ?

## 2022-02-19 NOTE — Patient Instructions (Signed)
Monitor blood pressure and be in touch if consistently > 130/80 

## 2022-03-08 ENCOUNTER — Telehealth: Payer: Self-pay | Admitting: Family Medicine

## 2022-03-08 NOTE — Telephone Encounter (Signed)
Pt called stating that he had a question concerning care that he would like to ask Dr. Elease Hashimoto. Wouldn't go further with anymore details.  ? ?Please advise.  ?

## 2022-03-11 NOTE — Telephone Encounter (Signed)
Pt is calling and he had a stroke and his friend had a stroke as well and would like to know if dr burchette would accept his friend as a new pt. I told Mr. Wahlert md is no accepting  new pt. Please advise ?

## 2022-03-12 NOTE — Telephone Encounter (Signed)
I spoke with the pt and informed him of the message below. Pt stated his friend will call in to schedule an appointment. Pt reported his friend's name is Lina Sar  ?

## 2022-03-27 ENCOUNTER — Telehealth: Payer: Self-pay | Admitting: Family Medicine

## 2022-03-27 MED ORDER — ATORVASTATIN CALCIUM 40 MG PO TABS: ORAL_TABLET | ORAL | 3 refills | Status: DC

## 2022-03-27 NOTE — Telephone Encounter (Signed)
Rx sent 

## 2022-03-27 NOTE — Telephone Encounter (Signed)
Pt is calling and pharm still has old rx for 20 mg instead of 40 mg. Please send in rx for atorvastatin (LIPITOR) 40 MG tablet  ?[id number] [address]  [phone] ?Fax:  [phone] ?  ? ?

## 2022-04-10 ENCOUNTER — Encounter: Payer: Self-pay | Admitting: Family Medicine

## 2022-04-10 ENCOUNTER — Ambulatory Visit (INDEPENDENT_AMBULATORY_CARE_PROVIDER_SITE_OTHER): Payer: Medicare Other

## 2022-04-10 ENCOUNTER — Ambulatory Visit (INDEPENDENT_AMBULATORY_CARE_PROVIDER_SITE_OTHER): Payer: Medicare Other | Admitting: Family Medicine

## 2022-04-10 VITALS — BP 126/70 | HR 81 | Temp 97.5°F | Ht 67.5 in | Wt 181.2 lb

## 2022-04-10 DIAGNOSIS — M25552 Pain in left hip: Secondary | ICD-10-CM | POA: Diagnosis not present

## 2022-04-10 DIAGNOSIS — M25652 Stiffness of left hip, not elsewhere classified: Secondary | ICD-10-CM

## 2022-04-10 NOTE — Patient Instructions (Signed)
You do have some osteoarthritis of left hip > right and suspect some of the stiffness is related to that ?

## 2022-04-10 NOTE — Progress Notes (Signed)
Established Patient Office Visit  Subjective   Patient ID: Jon Moses, male    DOB: 1951/03/09  Age: 71 y.o. MRN: [id number]  Chief Complaint  Patient presents with   Cerebrovascular Accident    HPI   Here to discuss the following items  For several months now has had some left hip stiffness.  He recalls going up some stairs and at 1 point twisted and felt some pain in his hip-months ago.  He has stiffness more than pain at this time.  No problems with ambulation.  Denies any sciatica type symptoms.  The stiffness has limited his walking somewhat.   He had CVA several months ago and has had some residual mild numbness left upper extremity especially shoulder region and left knee.  Still some very mild weakness but overall improved somewhat.  No new symptoms.  Blood pressures been finally improved with combination therapy with amlodipine, olmesartan HCTZ, Aldactone, and hydralazine.  Lipids well controlled on high-dose Lipitor.  He remains on aspirin.  Past Medical History:  Diagnosis Date   Diabetes mellitus without complication ([place])    Hyperlipidemia 03/26/2011   HYPERTENSION 07/17/2010   Past Surgical History:  Procedure Laterality Date   COLONOSCOPY     11 yrs ago from 2021   [place]   left   LIPOMA EXCISION Left 12/24/2013   Procedure: EXCISION MASS LEFT BACK;  Surgeon: Zenovia Jarred, MD;  Location: [facility];  Service: General;  Laterality: Left;   TONSILLECTOMY      reports that he has never smoked. He has never used smokeless tobacco. He reports current alcohol use. He reports that he does not use drugs. family history includes Hypertension in his mother and another family member; Stroke in his father. Allergies  Allergen Reactions   Ace Inhibitors Cough    Review of Systems  Constitutional:  Negative for chills and fever.  Respiratory:  Negative for shortness of breath.   Cardiovascular:  Negative for chest pain.   Musculoskeletal:  Negative for joint pain.     Objective:     BP 126/70 (BP Location: Left Arm, Patient Position: Sitting, Cuff Size: Normal)   Pulse 81   Temp (!) 97.5 F (36.4 C) (Oral)   Ht 5' 7.5" (1.715 m)   Wt 181 lb 3.2 oz (82.2 kg)   SpO2 98%   BMI 27.96 kg/m    Physical Exam Vitals reviewed.  Constitutional:      Appearance: Normal appearance.  Cardiovascular:     Rate and Rhythm: Normal rate and regular rhythm.  Pulmonary:     Effort: Pulmonary effort is normal.     Breath sounds: Normal breath sounds.  Musculoskeletal:     Right lower leg: No edema.     Left lower leg: No edema.     Comments: No pain with internal and external rotation of the left hip.  Mild stiffness with rotation but no significant difference compared to the right.  Neurological:     Mental Status: He is alert.     Comments: Mild weakness with left knee extension and left hip flexion compared to the right.  Full strength with plantarflexion and dorsiflexion     No results found for any visits on 04/10/22.    The ASCVD Risk score (Arnett DK, et al., 2019) failed to calculate for the following reasons:   The patient has a prior MI or stroke diagnosis    Assessment & Plan:  Problem List Items Addressed This Visit   None Visit Diagnoses     Hip stiffness, left    -  Primary   Relevant Orders   DG Hip Unilat W OR W/O Pelvis 2-3 Views Left     X-ray reveals some degenerative arthritis left hip slightly worse than right.  No other acute bony abnormalities noted.  This will be read by radiology.  We discussed the importance of maintaining good lower extremity strength and consider alternative exercises such as cycling/exercise bike.  He does not have significant pain at this time.    No follow-ups on file.    Carolann Littler, MD

## 2022-04-14 DIAGNOSIS — E083311 Diabetes mellitus due to underlying condition with moderate nonproliferative diabetic retinopathy with macular edema, right eye: Secondary | ICD-10-CM | POA: Diagnosis not present

## 2022-04-14 DIAGNOSIS — Z0101 Encounter for examination of eyes and vision with abnormal findings: Secondary | ICD-10-CM | POA: Diagnosis not present

## 2022-04-16 ENCOUNTER — Telehealth: Payer: Self-pay | Admitting: Family Medicine

## 2022-04-16 DIAGNOSIS — R937 Abnormal findings on diagnostic imaging of other parts of musculoskeletal system: Secondary | ICD-10-CM

## 2022-04-16 NOTE — Telephone Encounter (Signed)
Pt is calling and said he spoke with dr burchette yesterday and waiting on MRI or CT scan to be order ?hip

## 2022-04-17 ENCOUNTER — Telehealth: Payer: Self-pay | Admitting: Family Medicine

## 2022-04-17 NOTE — Telephone Encounter (Signed)
See previous telephone call

## 2022-04-17 NOTE — Telephone Encounter (Signed)
Pt informed that MRI has been ordered and someone will be contacting him in regards to scheduling.

## 2022-04-17 NOTE — Telephone Encounter (Signed)
Left a message for the pt to return my call.  

## 2022-04-17 NOTE — Telephone Encounter (Signed)
Pt is returning Jon Moses call from today

## 2022-04-19 NOTE — Telephone Encounter (Signed)
Pt is calling per pt Jon Moses imaging told him mri of hip needs to be with and without contrast

## 2022-04-23 NOTE — Addendum Note (Signed)
Addended by: Eulas Post on: 04/23/2022 01:24 PM   Modules accepted: Orders

## 2022-04-23 NOTE — Telephone Encounter (Signed)
Tanzania with [facility] is calling and still waiting on MRI order

## 2022-04-23 NOTE — Telephone Encounter (Signed)
I have placed order for MRI right hip with and without contrast

## 2022-04-24 ENCOUNTER — Other Ambulatory Visit: Payer: Self-pay | Admitting: Family Medicine

## 2022-04-25 ENCOUNTER — Ambulatory Visit
Admission: RE | Admit: 2022-04-25 | Discharge: 2022-04-25 | Disposition: A | Discharge status: Home / Self Care | Payer: Medicare Other | Source: Ambulatory Visit | Attending: Family Medicine | Admitting: Family Medicine

## 2022-04-25 DIAGNOSIS — M1611 Unilateral primary osteoarthritis, right hip: Secondary | ICD-10-CM | POA: Diagnosis not present

## 2022-04-25 DIAGNOSIS — R937 Abnormal findings on diagnostic imaging of other parts of musculoskeletal system: Secondary | ICD-10-CM

## 2022-04-25 MED ORDER — GADOBENATE DIMEGLUMINE 529 MG/ML IV SOLN
17.0000 mL | Freq: Once | INTRAVENOUS | Status: AC | PRN
  Administered 2022-04-25: 17 mL via INTRAVENOUS

## 2022-04-25 NOTE — Telephone Encounter (Signed)
Tanzania informed that orders has been placed and she reported the pt has been scheduled for MRI

## 2022-04-26 ENCOUNTER — Telehealth: Payer: Self-pay | Admitting: Family Medicine

## 2022-04-26 NOTE — Telephone Encounter (Signed)
Pt informed of the message and verbalized understanding  

## 2022-04-26 NOTE — Telephone Encounter (Signed)
Pt is calling and would like MRI of hip results. Pt had at [facility] yesterday

## 2022-05-01 ENCOUNTER — Ambulatory Visit (INDEPENDENT_AMBULATORY_CARE_PROVIDER_SITE_OTHER): Payer: Medicare Other | Admitting: Family Medicine

## 2022-05-01 ENCOUNTER — Encounter: Payer: Self-pay | Admitting: Family Medicine

## 2022-05-01 VITALS — BP 134/80 | HR 82 | Temp 97.7°F | Ht 67.5 in | Wt 181.1 lb

## 2022-05-01 DIAGNOSIS — D219 Benign neoplasm of connective and other soft tissue, unspecified: Secondary | ICD-10-CM | POA: Diagnosis not present

## 2022-05-01 NOTE — Progress Notes (Signed)
Established Patient Office Visit  Subjective   Patient ID: Jon Moses, male    DOB: 08-08-1951  Age: 71 y.o. MRN: [id number]  Chief Complaint  Patient presents with   Results    HPI   Here to discuss recent MRI results of the right hip.  He was seen middle of May and complained of some increased stiffness in the left hip.  Suspected osteoarthritis.  We obtained some plain films which show no fracture or dislocation.  He did have moderate left and mild right degenerative changes with indeterminate heterogenous sclerotic lesion right femoral neck.  Recommendation to further evaluate with CT or MRI.  We ordered MRI right hip with and without contrast.  He had no right hip pain whatsoever.  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  No evidence for fracture or AVN.  Moderate degenerative changes of the hips.  No other acute intrapelvic abnormalities.  Recommendation to consider repeat radiographs in 6 months to document stability.  He again has no history of right hip pain.  Has been picking up his exercise some recently.  IMPRESSION: 1. 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 Recommend repeat radiographs in 6 months to document stability. 2. Moderate bilateral hip joint degenerative changes but no stress fracture or AVN. 3. No significant intrapelvic abnormalities.    Past Medical History:  Diagnosis Date   Diabetes mellitus without complication ([place])    Hyperlipidemia 03/26/2011   HYPERTENSION 07/17/2010   Past Surgical History:  Procedure Laterality Date   COLONOSCOPY     11 yrs ago from 2021   [place]   left   LIPOMA EXCISION Left 12/24/2013   Procedure: EXCISION MASS LEFT BACK;  Surgeon: Zenovia Jarred, MD;  Location: [facility];  Service: General;  Laterality: Left;   TONSILLECTOMY       reports that he has never smoked. He has never used smokeless tobacco. He reports current alcohol use. He reports that he does not use drugs. family history includes Hypertension in his mother and another family member; Stroke in his father. Allergies  Allergen Reactions   Ace Inhibitors Cough    Review of Systems  Constitutional:  Positive for malaise/fatigue. Negative for chills, fever and weight loss.  Musculoskeletal:  Negative for back pain.     Objective:     BP 134/80 (BP Location: Left Arm, Patient Position: Sitting, Cuff Size: Normal)   Pulse 82   Temp 97.7 F (36.5 C) (Oral)   Ht 5' 7.5" (1.715 m)   Wt 181 lb 1.6 oz (82.1 kg)   SpO2 98%   BMI 27.95 kg/m    Physical Exam Vitals reviewed.  Constitutional:      Appearance: Normal appearance.  Cardiovascular:     Rate and Rhythm: Normal rate and regular rhythm.  Pulmonary:     Effort: Pulmonary effort is normal.     Breath sounds: Normal breath sounds.  Neurological:     Mental Status: He is alert.     No results found for any visits on 05/01/22.    The ASCVD Risk score (Arnett DK, et al., 2019) failed to calculate for the following reasons:   The patient has a prior MI or stroke diagnosis    Assessment & Plan:   Recent abnormal x-ray right hip.  Patient asymptomatic regarding right hip.  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  We will plan to repeat imaging in about 6 months to assess stability.  Return in about 3 months (around 08/01/2022).    Carolann Littler, MD

## 2022-05-03 ENCOUNTER — Telehealth: Payer: Self-pay | Admitting: Family Medicine

## 2022-05-03 NOTE — Telephone Encounter (Signed)
Left a message for the pt to return my call.  

## 2022-05-03 NOTE — Telephone Encounter (Signed)
Pt informed of the message and verbalized understanding  

## 2022-05-03 NOTE — Telephone Encounter (Signed)
Pt had MRI of his hip and tumor like thing shown/showed up on MRI and he would like to know if it could be removed

## 2022-05-24 ENCOUNTER — Ambulatory Visit: Payer: Medicare Other | Admitting: Family Medicine

## 2022-05-29 ENCOUNTER — Encounter: Payer: Self-pay | Admitting: Family Medicine

## 2022-05-29 ENCOUNTER — Ambulatory Visit (INDEPENDENT_AMBULATORY_CARE_PROVIDER_SITE_OTHER): Payer: Medicare Other | Admitting: Family Medicine

## 2022-05-29 VITALS — BP 120/78 | HR 85 | Temp 97.8°F | Ht 67.5 in | Wt 181.3 lb

## 2022-05-29 DIAGNOSIS — E1165 Type 2 diabetes mellitus with hyperglycemia: Secondary | ICD-10-CM

## 2022-05-29 DIAGNOSIS — M1612 Unilateral primary osteoarthritis, left hip: Secondary | ICD-10-CM | POA: Diagnosis not present

## 2022-05-29 DIAGNOSIS — I10 Essential (primary) hypertension: Secondary | ICD-10-CM | POA: Diagnosis not present

## 2022-05-29 DIAGNOSIS — I340 Nonrheumatic mitral (valve) insufficiency: Secondary | ICD-10-CM

## 2022-05-29 LAB — POCT GLYCOSYLATED HEMOGLOBIN (HGB A1C): Hemoglobin A1C: 6 % — AB (ref 4.0–5.6)

## 2022-05-29 NOTE — Progress Notes (Signed)
Established Patient Office Visit  Subjective   Patient ID: Jon Moses, male    DOB: 25-Jan-1951  Age: 71 y.o. MRN: [id number]  Chief Complaint  Patient presents with   Follow-up    HPI   Here for medical follow-up.  We had seen him back in March and apparently scheduled this visit at that time.  His A1c was 6.8% and we recommended 52-monthfollow-up.  We adjusted his metformin dosage at that time.  Blood sugars have been well controlled.  A1c today improved to 6.0%.  He had stroke last fall and we discussed at length the importance of tight blood sugar control, aggressive control lipids, and good blood pressure control.  He has started exercising more and just recently started back some bicycling.  Progressive left hip stiffness.  Stiffness greater than pain.  Has made it more difficult to get around.  Recent x-ray showed moderate arthritis left hip and mild right.  He had MRI right hip because of some sclerosis that was noted on the plain film.  MRI was more reassuring. Has recently resumed some cycling.  Used to play lots of tennis.  Past Medical History:  Diagnosis Date   [place]    Hyperlipidemia 03/26/2011   HYPERTENSION 07/17/2010   Past Surgical History:  Procedure Laterality Date   COLONOSCOPY     11 yrs ago from 2021   [place]  [place]   LIPOMA EXCISION Left 12/24/2013   Procedure: EXCISION MASS LEFT BACK;  Surgeon: BZenovia Jarred MD;  [place] [facility]  Service: General;  Laterality: Left;   TONSILLECTOMY      reports that he has never smoked. He has never used smokeless tobacco. He reports current alcohol use. He reports that he does not use drugs. family history includes Hypertension in his mother and another family member; Stroke in his father. Allergies  Allergen Reactions   Ace Inhibitors Cough    Review of Systems  Constitutional:  Negative for malaise/fatigue.  Eyes:  Negative for  blurred vision.  Respiratory:  Negative for shortness of breath.   Cardiovascular:  Negative for chest pain.  Neurological:  Negative for dizziness, weakness and headaches.      Objective:     BP 120/78 (BP Location: Left Arm, Patient Position: Sitting, Cuff Size: Normal)   Pulse 85   Temp 97.8 F (36.6 C) (Oral)   Ht 5' 7.5" (1.715 m)   Wt 181 lb 4.8 oz (82.2 kg)   SpO2 98%   BMI 27.98 kg/m  BP Readings from Last 3 Encounters:  05/29/22 120/78  05/01/22 134/80  04/10/22 126/70   Wt Readings from Last 3 Encounters:  05/29/22 181 lb 4.8 oz (82.2 kg)  05/01/22 181 lb 1.6 oz (82.1 kg)  04/10/22 181 lb 3.2 oz (82.2 kg)      Physical Exam Constitutional:      Appearance: He is well-developed.  HENT:     Right Ear: External ear normal.     Left Ear: External ear normal.  Eyes:     Pupils: Pupils are equal, round, and reactive to light.  Neck:     Thyroid: No thyromegaly.  Cardiovascular:     Rate and Rhythm: Normal rate and regular rhythm.     Comments: He does have soft systolic murmur left axillary region.-Slightly increased with expiration and squatting to stand Pulmonary:     Effort: Pulmonary effort is normal. No respiratory distress.  Breath sounds: Normal breath sounds. No wheezing or rales.  Musculoskeletal:     Cervical back: Neck supple.  Neurological:     Mental Status: He is alert and oriented to person, place, and time.     No results found for any visits on 05/29/22.  Last CBC Lab Results  Component Value Date   WBC 4.6 08/08/2021   HGB 13.4 08/08/2021   HCT 39.6 08/08/2021   MCV 91.3 08/08/2021   RDW 13.6 08/08/2021   PLT 251.0 69/48/5462   Last metabolic panel Lab Results  Component Value Date   GLUCOSE 107 (H) 02/19/2022   NA 135 02/19/2022   K 4.3 02/19/2022   CL 100 02/19/2022   CO2 28 02/19/2022   BUN 14 02/19/2022   CREATININE 1.32 02/19/2022   GFRNONAA 56 (L) 12/21/2013   CALCIUM 9.5 02/19/2022   PROT 7.5 12/19/2021    ALBUMIN 4.4 12/19/2021   BILITOT 0.5 12/19/2021   ALKPHOS 65 12/19/2021   AST 17 12/19/2021   ALT 25 12/19/2021   Last lipids Lab Results  Component Value Date   CHOL 114 12/19/2021   HDL 47.30 12/19/2021   LDLCALC 41 12/19/2021   LDLDIRECT 175.2 10/11/2013   TRIG 130.0 12/19/2021   CHOLHDL 2 12/19/2021   Last hemoglobin A1c Lab Results  Component Value Date   HGBA1C 6.0 (A) 05/29/2022      The ASCVD Risk score (Arnett DK, et al., 2019) failed to calculate for the following reasons:   The patient has a prior MI or stroke diagnosis    Assessment & Plan:   #1 type 2 diabetes well controlled and improved with A1c today 6.0%.  Continue metformin.  Continue low glycemic diet.  Recheck in 6 months Continue yearly diabetic eye exam  #2 hypertension-improving control.  Especially in important in setting of prior stroke.  Continue combination therapy with olmesartan HCTZ, hydralazine, Aldactone, and amlodipine.  #3 osteoarthritis left hip.  Moderate by previous plain films.  He would like to go and see orthopedist at this time.  This will be set up.  #4 mitral regurgitation murmur.  Heard left axilla and worse squatting to stand and expiration.  He had echo back in December which showed only some mild mitral regurgitation.  He has no associated dizziness or other symptoms and observe for now.  #5 history of CVA.  Reiterated good secondary control.  His blood pressure, blood sugar, and lipids are well controlled at this time.  Recheck lipids in 83-monthfollow-up  No follow-ups on file.    BCarolann Littler MD

## 2022-05-29 NOTE — Patient Instructions (Signed)
A1C improved to 6.0%.  keep up the good work!  Try to gradually increased exercise, as discussed.    I will set up orthopedic referral for left hip.

## 2022-06-03 ENCOUNTER — Other Ambulatory Visit: Payer: Self-pay | Admitting: Family Medicine

## 2022-06-07 DIAGNOSIS — M25552 Pain in left hip: Secondary | ICD-10-CM | POA: Diagnosis not present

## 2022-06-10 IMAGING — MR MR HIP*R* WO/W CM
7 of 8 series · 35 of 40 positions shown · IV contrast (multihance)
Comparison: Radiographs 04/10/2022

CLINICAL DATA: Evaluate right femoral neck lesion seen on recent
radiographs.

EXAM:
MRI OF THE RIGHT HIP WITHOUT AND WITH CONTRAST
TECHNIQUE: Multiplanar, multisequence MR imaging was performed both before and
after administration of intravenous contrast.
CONTRAST:  17mL MULTIHANCE GADOBENATE DIMEGLUMINE 529 MG/ML IV SOLN

[Series 9: T2 fat-sat · coronal · right · 3.0mm · 0.89mm/px · 5 of 30 slices shown (1 of 2)]
[im 1/30]
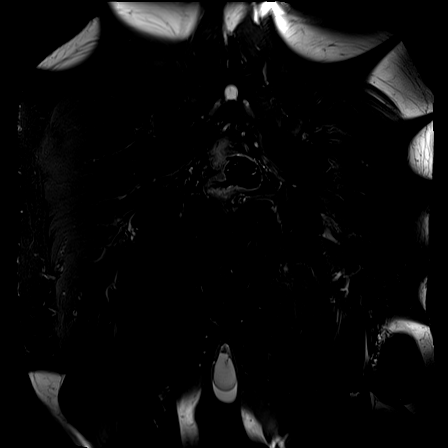
[im 8/30]
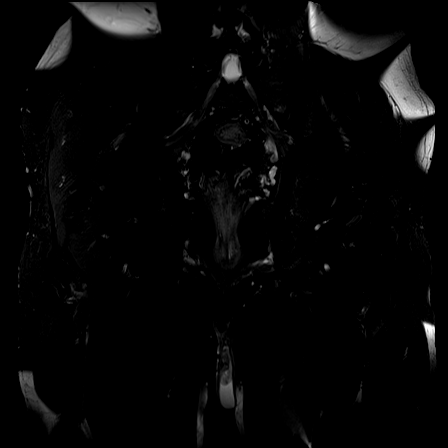
[im 15/30]
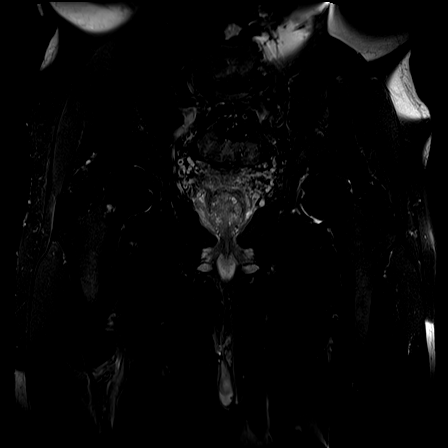
[im 22/30]
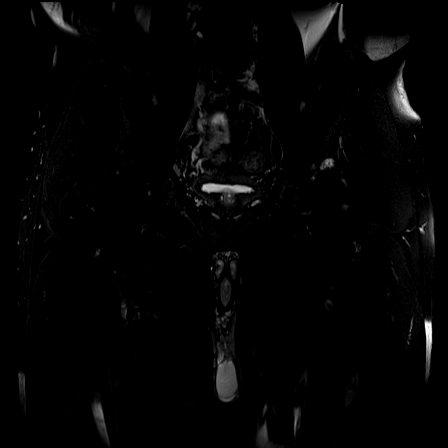
[im 30/30]
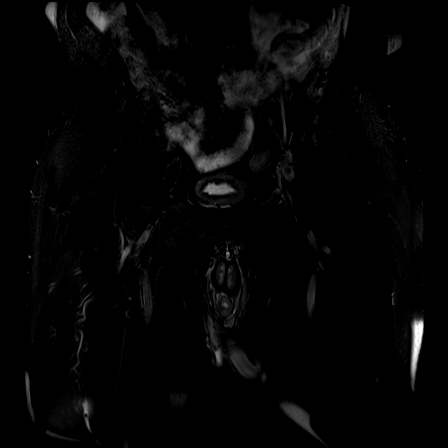

[Series 10: T1 · coronal · right · 3.0mm · 0.89mm/px · 5 of 30 slices shown]
[im 1/30]
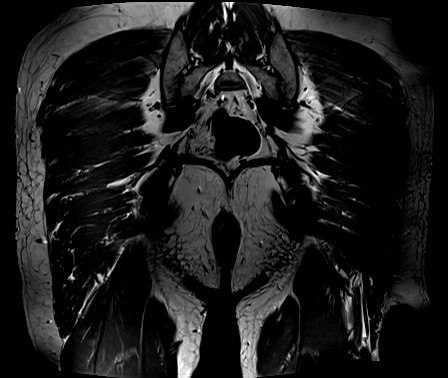
[im 8/30]
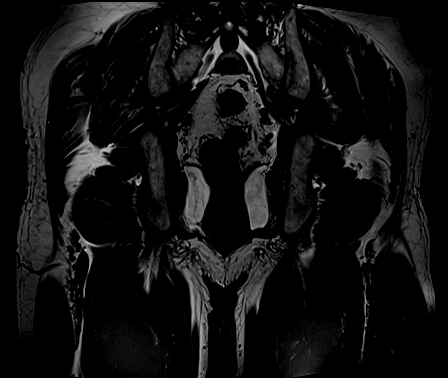
[im 15/30]
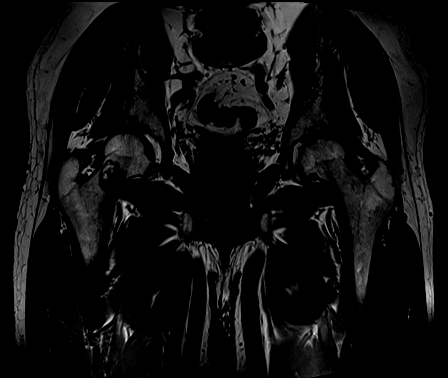
[im 22/30]
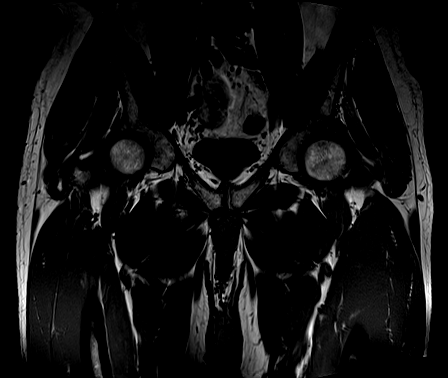
[im 30/30]
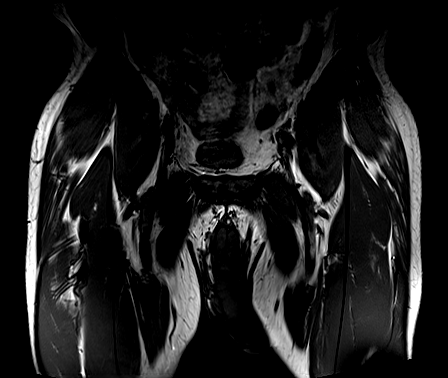

[Series 11: T2 fat-sat · axial · right · 3.0mm · 1.19mm/px · z∈[+4,+98]mm · 5 of 27 slices shown (2 of 2)]
[im 1/27]
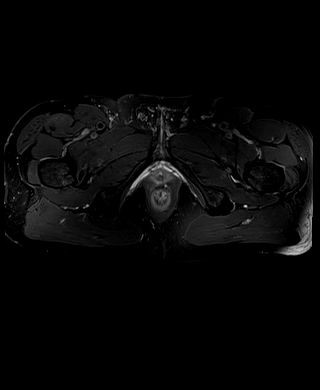
[im 7/27]
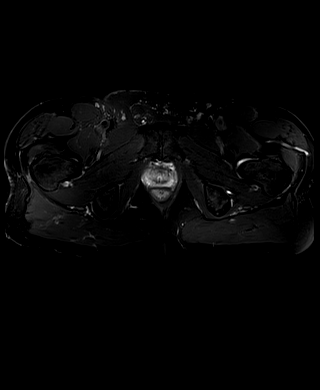
[im 14/27]
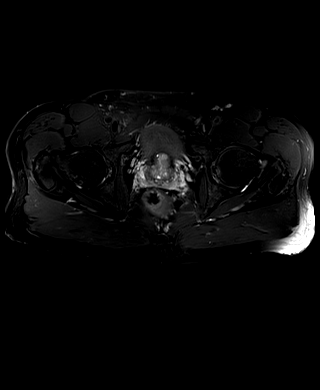
[im 20/27]
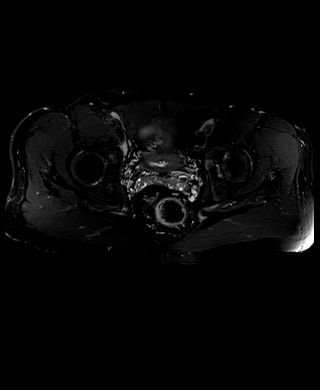
[im 27/27]
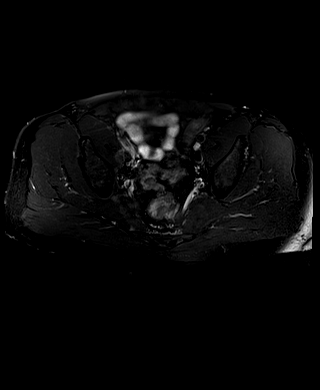

[Series 12: PD fat-sat · coronal · right · 3.0mm · 0.56mm/px · 5 of 28 slices shown (1 of 2)]
[im 1/28]
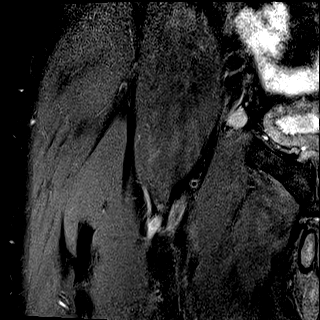
[im 7/28]
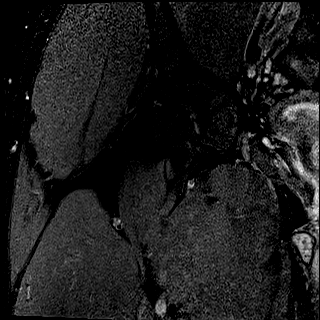
[im 14/28]
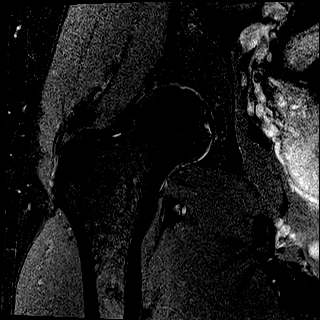
[im 21/28]
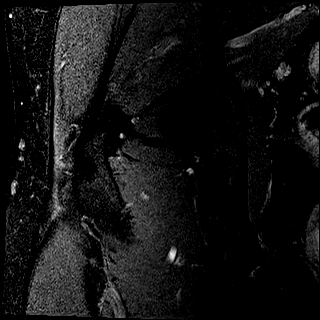
[im 28/28]
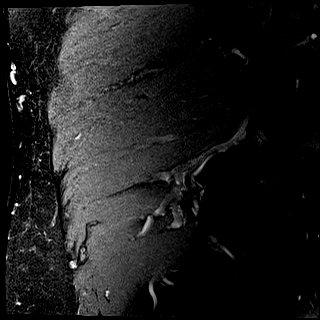

[Series 13: PD fat-sat · sagittal · right · 3.0mm · 0.56mm/px · 6 of 35 slices shown (2 of 2)]
[im 1/35]
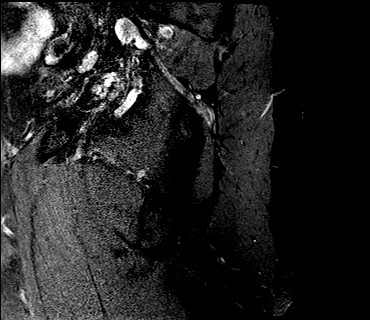
[im 7/35]
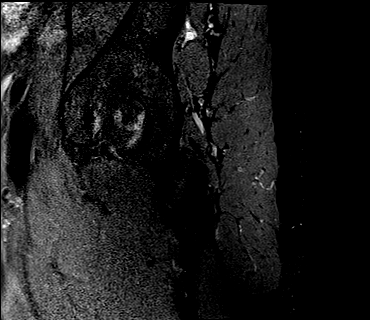
[im 14/35]
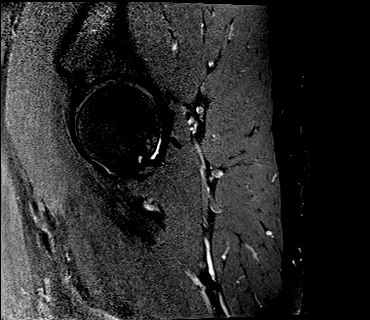
[im 21/35]
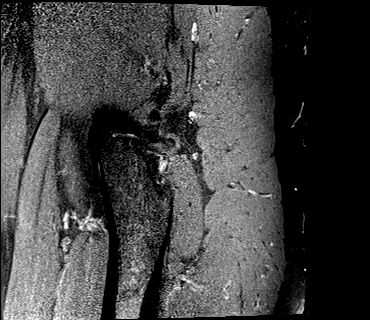
[im 28/35]
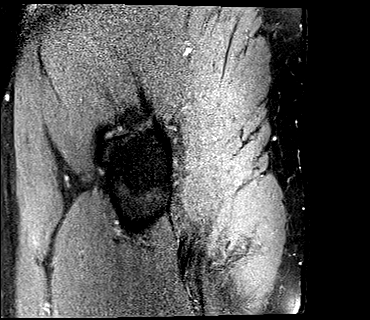
[im 35/35]
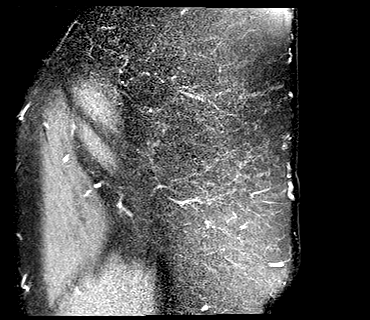

[Series 14: T1 fat-sat · axial · non-contrast · right · 3.0mm · 0.56mm/px · z∈[+6,+103]mm · 5 of 28 slices shown]
[im 1/28]
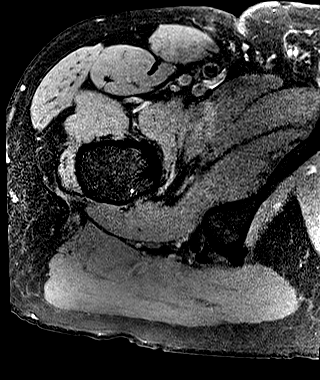
[im 7/28]
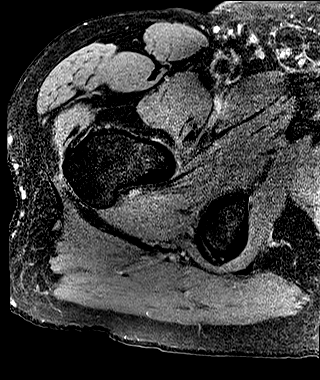
[im 14/28]
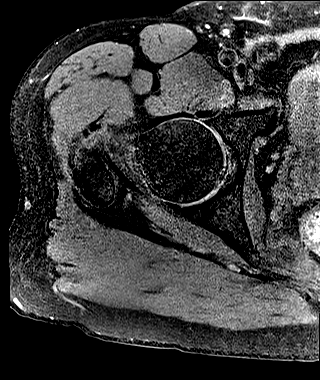
[im 21/28]
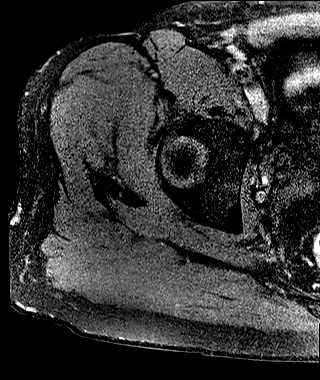
[im 28/28]
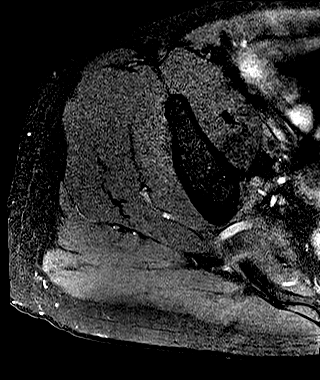

[Series 15: T1 fat-sat post-contrast · axial · right · 3.0mm · 0.56mm/px · z∈[+6,+78]mm · 4 of 28 slices shown]
[im 1/28]
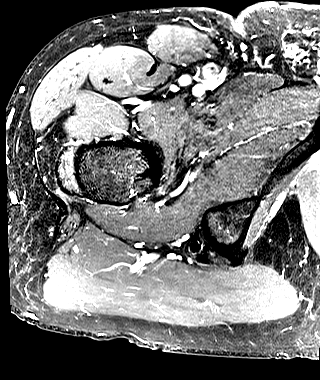
[im 7/28]
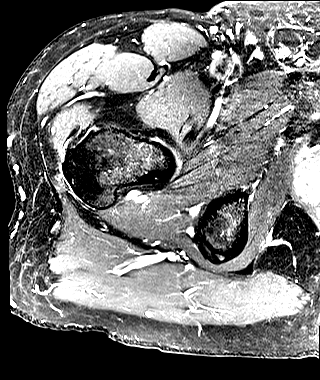
[im 14/28]
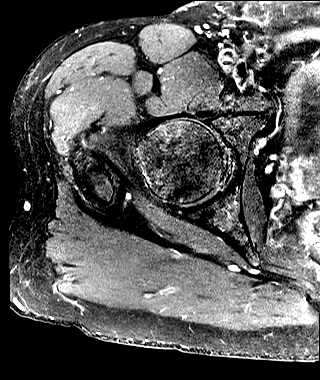
[im 21/28]
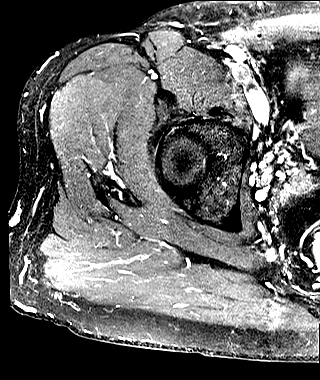

[35 of 40 positions shown; findings below may reference images not displayed]

FINDINGS: Both hips are normally located. No stress fracture or AVN. Moderate
bilateral hip joint degenerative changes with degenerative
chondrosis, early joint space narrowing and early spurring. No hip
joint effusion. No periarticular fluid collections to suggest a
paralabral cyst.

The 2.4 cm lesion in the right head neck junction region posteriorly
is smoothly marginated and well corticated. No surrounding
inflammation/edema. The lesion has areas of increased and decreased
T1 signal intensity and T2 signal intensity and no appreciable
contrast enhancement. The plain film and MR findings are most
consistent with a benign liposclerosing myxofibrous tumor (LSMFT).

The pubic symphysis and SI joints are intact. Mild degenerative
changes. No pelvic fracture or bone lesion.

The surrounding hip and pelvic musculature are unremarkable. No
muscle tear, myositis or mass.

No significant intrapelvic abnormalities are identified. Uniformly
thick wall bladder likely due to lack of distension.
IMPRESSION: 1. 2.4 cm right hip lesion has plain film and MR findings most
consistent with a benign liposclerosing myxofibrous tumor (LSMFT).
Recommend repeat radiographs in 6 months to document stability.
2. Moderate bilateral hip joint degenerative changes but no stress
fracture or AVN.
3. No significant intrapelvic abnormalities.

## 2022-06-18 ENCOUNTER — Other Ambulatory Visit: Payer: Self-pay | Admitting: Family Medicine

## 2022-06-28 ENCOUNTER — Other Ambulatory Visit: Payer: Self-pay

## 2022-06-28 ENCOUNTER — Other Ambulatory Visit: Payer: Self-pay | Admitting: Family Medicine

## 2022-06-28 DIAGNOSIS — Z7409 Other reduced mobility: Secondary | ICD-10-CM | POA: Diagnosis not present

## 2022-06-28 DIAGNOSIS — I639 Cerebral infarction, unspecified: Secondary | ICD-10-CM | POA: Diagnosis not present

## 2022-06-28 DIAGNOSIS — R531 Weakness: Secondary | ICD-10-CM | POA: Diagnosis not present

## 2022-06-28 DIAGNOSIS — M25552 Pain in left hip: Secondary | ICD-10-CM | POA: Diagnosis not present

## 2022-06-28 DIAGNOSIS — I10 Essential (primary) hypertension: Secondary | ICD-10-CM

## 2022-06-28 MED ORDER — AMLODIPINE BESYLATE 10 MG PO TABS: 10.0000 mg | ORAL_TABLET | Freq: Every day | ORAL | 3 refills | Status: DC

## 2022-06-28 MED ORDER — OLMESARTAN MEDOXOMIL-HCTZ 40-12.5 MG PO TABS: 1.0000 | ORAL_TABLET | Freq: Every day | ORAL | 3 refills | Status: DC

## 2022-06-28 NOTE — Telephone Encounter (Signed)
amLODipine (NORVASC) 10 MG tablet states he was moved to a higher doage in December, please advise and prescribe if necessary

## 2022-07-16 ENCOUNTER — Encounter: Payer: Self-pay | Admitting: Family Medicine

## 2022-07-16 ENCOUNTER — Ambulatory Visit (INDEPENDENT_AMBULATORY_CARE_PROVIDER_SITE_OTHER): Payer: Medicare Other | Admitting: Family Medicine

## 2022-07-16 VITALS — BP 110/66 | HR 80 | Temp 97.4°F | Ht 67.5 in | Wt 180.6 lb

## 2022-07-16 DIAGNOSIS — R29898 Other symptoms and signs involving the musculoskeletal system: Secondary | ICD-10-CM | POA: Diagnosis not present

## 2022-07-16 NOTE — Patient Instructions (Signed)
Get back to more consistent exercise-especially elliptical and stationary cycling.    Agree with getting more physical therapy.

## 2022-07-16 NOTE — Progress Notes (Signed)
Established Patient Office Visit  Subjective   Patient ID: Jon Moses, male    DOB: 1951/01/16  Age: 71 y.o. MRN: [id number]  Chief Complaint  Patient presents with   Extremity Weakness    Patient complains of extremitiy weakness,     HPI   Here to discuss extremity weakness predominantly left lower extremity.  He had stroke last December with MRI demonstrating subacute infarct right paramedian pons as well as punctate acute subacute infarct of the right parietal occipital region.  Since that time he has had well-controlled blood pressure as well as well-controlled lipids and recent A1c 6.0%.  His main concern is ongoing left lower extremity weakness.  He has seen orthopedist regarding his left knee and they advised some physical therapy.  He of course had physical therapy following his stroke but has been arranged to get additional physical therapy over in [place] closer to his home.  He notices difficulty especially with things like walking up hills.  Prior to his stroke he was walking about 4 to 5 miles per day and cycling.  He still feels like he has some balance issues with cycling.  He does have access to the [facility] where he can use various types of equipment including stationary cycle but has not yet started that.  Denies any back pain, hip pain, or knee pain currently.  No significant left upper extremity weakness.  Past Medical History:  Diagnosis Date   Diabetes mellitus without complication ([place])    Hyperlipidemia 03/26/2011   HYPERTENSION 07/17/2010   Past Surgical History:  Procedure Laterality Date   COLONOSCOPY     11 yrs ago from 2021   [place]   left   LIPOMA EXCISION Left 12/24/2013   Procedure: EXCISION MASS LEFT BACK;  Surgeon: Zenovia Jarred, MD;  Location: [place];  Service: General;  Laterality: Left;   TONSILLECTOMY      reports that he has never smoked. He has never used smokeless tobacco. He reports current alcohol  use. He reports that he does not use drugs. family history includes Hypertension in his mother and another family member; Stroke in his father. Allergies  Allergen Reactions   Ace Inhibitors Cough    Review of Systems  Constitutional:  Negative for chills and fever.  Respiratory:  Negative for shortness of breath.   Cardiovascular:  Negative for chest pain.  Neurological:  Positive for weakness. Negative for dizziness and headaches.      Objective:     BP 110/66 (BP Location: Left Arm, Patient Position: Sitting, Cuff Size: Normal)   Pulse 80   Temp (!) 97.4 F (36.3 C) (Oral)   Ht 5' 7.5" (1.715 m)   Wt 180 lb 9.6 oz (81.9 kg)   SpO2 98%   BMI 27.87 kg/m    Physical Exam Vitals reviewed.  Constitutional:      Appearance: Normal appearance.  Cardiovascular:     Rate and Rhythm: Normal rate and regular rhythm.  Pulmonary:     Effort: Pulmonary effort is normal.     Breath sounds: Normal breath sounds.  Neurological:     Mental Status: He is alert.     Comments: He has diminished reflex right knee compared to the left.  1+ right knee and 2+ left knee.  1+ right Achilles reflex and trace left.  He has mild weakness with left plantarflexion dorsiflexion also some mild weakness with left hip flexion compared to the right side  No results found for any visits on 07/16/22.    The ASCVD Risk score (Arnett DK, et al., 2019) failed to calculate for the following reasons:   The patient has a prior MI or stroke diagnosis    Assessment & Plan:   Left lower extremity weakness.  Suspect this is still residual from his stroke.  He actually has decreased reflexes on the right knee and right Achilles compared to the left but weakness predominantly with left hip flexion.  -Agree with physical therapy which has been set up in [facility] -We have suggested that he start doing some exercises such as elliptical and perhaps recumbent bike -Set up complete  physical with full labs this fall sometime in September or October  No follow-ups on file.    Carolann Littler, MD

## 2022-07-17 DIAGNOSIS — R531 Weakness: Secondary | ICD-10-CM | POA: Diagnosis not present

## 2022-07-17 DIAGNOSIS — I6789 Other cerebrovascular disease: Secondary | ICD-10-CM | POA: Diagnosis not present

## 2022-07-17 DIAGNOSIS — R7303 Prediabetes: Secondary | ICD-10-CM | POA: Diagnosis not present

## 2022-07-17 DIAGNOSIS — Z8673 Personal history of transient ischemic attack (TIA), and cerebral infarction without residual deficits: Secondary | ICD-10-CM | POA: Diagnosis not present

## 2022-07-17 DIAGNOSIS — Z7982 Long term (current) use of aspirin: Secondary | ICD-10-CM | POA: Diagnosis not present

## 2022-07-17 DIAGNOSIS — I639 Cerebral infarction, unspecified: Secondary | ICD-10-CM | POA: Diagnosis not present

## 2022-07-17 DIAGNOSIS — Z79899 Other long term (current) drug therapy: Secondary | ICD-10-CM | POA: Diagnosis not present

## 2022-07-25 DIAGNOSIS — Z7409 Other reduced mobility: Secondary | ICD-10-CM | POA: Diagnosis not present

## 2022-07-25 DIAGNOSIS — I639 Cerebral infarction, unspecified: Secondary | ICD-10-CM | POA: Diagnosis not present

## 2022-07-25 DIAGNOSIS — R531 Weakness: Secondary | ICD-10-CM | POA: Diagnosis not present

## 2022-07-25 DIAGNOSIS — M25552 Pain in left hip: Secondary | ICD-10-CM | POA: Diagnosis not present

## 2022-08-01 DIAGNOSIS — M25552 Pain in left hip: Secondary | ICD-10-CM | POA: Diagnosis not present

## 2022-08-01 DIAGNOSIS — R531 Weakness: Secondary | ICD-10-CM | POA: Diagnosis not present

## 2022-08-01 DIAGNOSIS — Z7409 Other reduced mobility: Secondary | ICD-10-CM | POA: Diagnosis not present

## 2022-08-01 DIAGNOSIS — I639 Cerebral infarction, unspecified: Secondary | ICD-10-CM | POA: Diagnosis not present

## 2022-08-07 DIAGNOSIS — R531 Weakness: Secondary | ICD-10-CM | POA: Diagnosis not present

## 2022-08-07 DIAGNOSIS — I639 Cerebral infarction, unspecified: Secondary | ICD-10-CM | POA: Diagnosis not present

## 2022-08-07 DIAGNOSIS — Z7409 Other reduced mobility: Secondary | ICD-10-CM | POA: Diagnosis not present

## 2022-08-07 DIAGNOSIS — M25552 Pain in left hip: Secondary | ICD-10-CM | POA: Diagnosis not present

## 2022-08-15 DIAGNOSIS — R531 Weakness: Secondary | ICD-10-CM | POA: Diagnosis not present

## 2022-08-15 DIAGNOSIS — Z7409 Other reduced mobility: Secondary | ICD-10-CM | POA: Diagnosis not present

## 2022-08-15 DIAGNOSIS — M25552 Pain in left hip: Secondary | ICD-10-CM | POA: Diagnosis not present

## 2022-08-15 DIAGNOSIS — I639 Cerebral infarction, unspecified: Secondary | ICD-10-CM | POA: Diagnosis not present

## 2022-09-03 DIAGNOSIS — R531 Weakness: Secondary | ICD-10-CM | POA: Diagnosis not present

## 2022-09-03 DIAGNOSIS — M25552 Pain in left hip: Secondary | ICD-10-CM | POA: Diagnosis not present

## 2022-09-03 DIAGNOSIS — Z7409 Other reduced mobility: Secondary | ICD-10-CM | POA: Diagnosis not present

## 2022-09-03 DIAGNOSIS — I639 Cerebral infarction, unspecified: Secondary | ICD-10-CM | POA: Diagnosis not present

## 2022-09-12 DIAGNOSIS — R531 Weakness: Secondary | ICD-10-CM | POA: Diagnosis not present

## 2022-09-12 DIAGNOSIS — M25552 Pain in left hip: Secondary | ICD-10-CM | POA: Diagnosis not present

## 2022-09-12 DIAGNOSIS — Z7409 Other reduced mobility: Secondary | ICD-10-CM | POA: Diagnosis not present

## 2022-09-12 DIAGNOSIS — I639 Cerebral infarction, unspecified: Secondary | ICD-10-CM | POA: Diagnosis not present

## 2022-09-19 DIAGNOSIS — I639 Cerebral infarction, unspecified: Secondary | ICD-10-CM | POA: Diagnosis not present

## 2022-09-19 DIAGNOSIS — R531 Weakness: Secondary | ICD-10-CM | POA: Diagnosis not present

## 2022-09-19 DIAGNOSIS — Z7409 Other reduced mobility: Secondary | ICD-10-CM | POA: Diagnosis not present

## 2022-09-19 DIAGNOSIS — M25552 Pain in left hip: Secondary | ICD-10-CM | POA: Diagnosis not present

## 2022-09-25 ENCOUNTER — Telehealth: Payer: Self-pay | Admitting: Family Medicine

## 2022-09-25 NOTE — Telephone Encounter (Signed)
Returned patients call   Patient returned my call 

## 2022-09-25 NOTE — Telephone Encounter (Signed)
Left message for patient to call back and schedule Medicare Annual Wellness Visit (AWV) either virtually or in office. Left  my Jon Moses number [phone]   Last AWV 09/28/21  please schedule with Nurse Health Adviser   45 min for awv-i and in office appointments 30 min for awv-s  phone/virtual appointments

## 2022-09-29 ENCOUNTER — Other Ambulatory Visit: Payer: Self-pay | Admitting: Family Medicine

## 2022-10-11 ENCOUNTER — Telehealth: Payer: Self-pay

## 2022-10-11 ENCOUNTER — Ambulatory Visit: Payer: Medicare Other | Admitting: Family Medicine

## 2022-10-11 NOTE — Telephone Encounter (Signed)
Unsuccessful attempt to reach patient on preferred number listed in notes for scheduled AWV. Left message on voicemail okay to reschedule. 

## 2022-10-16 ENCOUNTER — Encounter: Payer: Self-pay | Admitting: Family Medicine

## 2022-10-16 ENCOUNTER — Ambulatory Visit (INDEPENDENT_AMBULATORY_CARE_PROVIDER_SITE_OTHER): Payer: Medicare Other | Admitting: Family Medicine

## 2022-10-16 VITALS — BP 126/86 | HR 85 | Temp 97.5°F | Ht 67.5 in | Wt 180.1 lb

## 2022-10-16 DIAGNOSIS — E1165 Type 2 diabetes mellitus with hyperglycemia: Secondary | ICD-10-CM

## 2022-10-16 DIAGNOSIS — E785 Hyperlipidemia, unspecified: Secondary | ICD-10-CM

## 2022-10-16 DIAGNOSIS — I10 Essential (primary) hypertension: Secondary | ICD-10-CM

## 2022-10-16 LAB — POCT GLYCOSYLATED HEMOGLOBIN (HGB A1C): Hemoglobin A1C: 6.1 % — AB (ref 4.0–5.6)

## 2022-10-16 NOTE — Patient Instructions (Signed)
A1C is 6.1 which is excellent  Keep up the good work  Let's plan on 4 month follow up     will check lipids at follow up.

## 2022-10-16 NOTE — Progress Notes (Signed)
Established Patient Office Visit  Subjective   Patient ID: Jon Moses, male    DOB: [birth year]  Age: 71 y.o. MRN: [id number]  Chief Complaint  Patient presents with   Follow-up    HPI   Jon Moses is here today for medical follow-up.  He has history of hypertension, type 2 diabetes, hyperlipidemia, history of CVA.  Current medications include amlodipine 10 mg daily, aspirin 81 mg daily, atorvastatin 40 mg daily, hydralazine 25 mg twice daily, metformin 500 mg twice daily, Benicar HCTZ 40/12.5 mg 1 daily, Aldactone 25 mg daily.  Blood pressures have been very well controlled by home readings.  No dizziness.  Blood sugars have been well controlled.  Last A1c was 6.0%.  He takes atorvastatin for hyperlipidemia and lipids last January were controlled with LDL 41  He has had some ongoing mild left hip issues which have limited activity somewhat but he does some cycling.  Past Medical History:  Diagnosis Date   Diabetes mellitus without complication ([place])    Hyperlipidemia 03/26/2011   HYPERTENSION 07/17/2010   Past Surgical History:  Procedure Laterality Date   COLONOSCOPY     11 yrs ago from 2021   [place]   left   LIPOMA EXCISION Left 12/24/2013   Procedure: EXCISION MASS LEFT BACK;  Surgeon: Zenovia Jarred, MD;  Location: [place];  Service: General;  Laterality: Left;   TONSILLECTOMY      reports that he has never smoked. He has never used smokeless tobacco. He reports current alcohol use. He reports that he does not use drugs. family history includes Hypertension in his mother and another family member; Stroke in his father. Allergies  Allergen Reactions   Ace Inhibitors Cough    Review of Systems  Constitutional:  Negative for malaise/fatigue.  Eyes:  Negative for blurred vision.  Respiratory:  Negative for shortness of breath.   Cardiovascular:  Negative for chest pain.  Neurological:  Negative for dizziness, weakness and headaches.       Objective:     BP 126/86 (BP Location: Left Arm, Patient Position: Sitting, Cuff Size: Normal)   Pulse 85   Temp (!) 97.5 F (36.4 C) (Oral)   Ht 5' 7.5" (1.715 m)   Wt 180 lb 1.6 oz (81.7 kg)   SpO2 98%   BMI 27.79 kg/m  BP Readings from Last 3 Encounters:  10/16/22 126/86  07/16/22 110/66  05/29/22 120/78   Wt Readings from Last 3 Encounters:  10/16/22 180 lb 1.6 oz (81.7 kg)  07/16/22 180 lb 9.6 oz (81.9 kg)  05/29/22 181 lb 4.8 oz (82.2 kg)      Physical Exam Vitals reviewed.  Constitutional:      Appearance: He is well-developed.  HENT:     Right Ear: External ear normal.     Left Ear: External ear normal.  Eyes:     Pupils: Pupils are equal, round, and reactive to light.  Neck:     Thyroid: No thyromegaly.  Cardiovascular:     Rate and Rhythm: Normal rate and regular rhythm.  Pulmonary:     Effort: Pulmonary effort is normal. No respiratory distress.     Breath sounds: Normal breath sounds. No wheezing or rales.  Musculoskeletal:     Cervical back: Neck supple.  Neurological:     Mental Status: He is alert and oriented to person, place, and time.      Results for orders placed or performed in visit  on 10/16/22  POCT glycosylated hemoglobin (Hb A1C)  Result Value Ref Range   Hemoglobin A1C 6.1 (A) 4.0 - 5.6 %   HbA1c POC (<> result, manual entry)     HbA1c, POC (prediabetic range)     HbA1c, POC (controlled diabetic range)      Last metabolic panel Lab Results  Component Value Date   GLUCOSE 107 (H) 02/19/2022   NA 135 02/19/2022   K 4.3 02/19/2022   CL 100 02/19/2022   CO2 28 02/19/2022   BUN 14 02/19/2022   CREATININE 1.32 02/19/2022   GFRNONAA 56 (L) 12/21/2013   CALCIUM 9.5 02/19/2022   PROT 7.5 12/19/2021   ALBUMIN 4.4 12/19/2021   BILITOT 0.5 12/19/2021   ALKPHOS 65 12/19/2021   AST 17 12/19/2021   ALT 25 12/19/2021   Last lipids Lab Results  Component Value Date   CHOL 114 12/19/2021   HDL 47.30 12/19/2021   LDLCALC  41 12/19/2021   LDLDIRECT 175.2 10/11/2013   TRIG 130.0 12/19/2021   CHOLHDL 2 12/19/2021   Last hemoglobin A1c Lab Results  Component Value Date   HGBA1C 6.1 (A) 10/16/2022      The ASCVD Risk score (Arnett DK, et al., 2019) failed to calculate for the following reasons:   The patient has a prior MI or stroke diagnosis    Assessment & Plan:   #1 type 2 diabetes controlled with A1c 6.1%.  Continue metformin.  Continue low glycemic diet.  Recheck at 69-monthfollow-up  #2 hypertension well-controlled.  Repeat blood pressure left arm seated after rest 118/68.  Continue current regimen of amlodipine, Benicar HCTZ, hydralazine, Aldactone  #3 hyperlipidemia.  Patient on high-dose statin with atorvastatin.  Recheck fasting lipids at 458-monthollow-up   Return in about 4 months (around 02/14/2023).    BrCarolann LittlerMD

## 2022-11-01 ENCOUNTER — Ambulatory Visit: Payer: Medicare Other | Admitting: Family Medicine

## 2022-11-12 ENCOUNTER — Ambulatory Visit (INDEPENDENT_AMBULATORY_CARE_PROVIDER_SITE_OTHER): Payer: Medicare Other

## 2022-11-12 VITALS — Ht 69.0 in | Wt 179.0 lb

## 2022-11-12 DIAGNOSIS — Z0000 Encounter for general adult medical examination without abnormal findings: Secondary | ICD-10-CM | POA: Diagnosis not present

## 2022-11-12 NOTE — Progress Notes (Signed)
Subjective:   Jon Moses is a 71 y.o. male who presents for Medicare Annual/Subsequent preventive examination.  Review of Systems    Virtual Visit via Telephone Note  I connected with  Jon Moses on 11/12/22 at 11:00 AM EST by telephone and verified that I am speaking with the correct person using two identifiers.  Location: Patient: Home Provider: Office Persons participating in the virtual visit: patient/Nurse Health Advisor   I discussed the limitations, risks, security and privacy concerns of performing an evaluation and management service by telephone and the availability of in person appointments. The patient expressed understanding and agreed to proceed.  Interactive audio and video telecommunications were attempted between this nurse and patient, however failed, due to patient having technical difficulties OR patient did not have access to video capability.  We continued and completed visit with audio only.  Some vital signs may be absent or patient reported.   Criselda Peaches, LPN  Cardiac Risk Factors include: advanced age (>35mn, >>26women);hypertension;male gender     Objective:    Today's Vitals   11/12/22 1115  Weight: 179 lb (81.2 kg)  Height: _0  (1.753 m)   Body mass index is 26.43 kg/m.     11/12/2022   11:24 AM 09/28/2021   11:23 AM 09/22/2020   11:29 AM 09/22/2020   11:19 AM 12/21/2013    2:19 PM  Advanced Directives  Does Patient Have a Medical Advance Directive? No No No No Patient does not have advance directive;Patient would not like information  Would patient like information on creating a medical advance directive? No - Patient declined No - Patient declined No - Patient declined No - Patient declined     Current Medications (verified) Outpatient Encounter Medications as of 11/12/2022  Medication Sig   amLODipine (NORVASC) 10 MG tablet Take 1 tablet (10 mg total) by mouth daily.   aspirin EC 81 MG tablet Take 81 mg by mouth  daily. Swallow whole.   atorvastatin (LIPITOR) 40 MG tablet Take 1 tablet (40 mg total) by mouth once daily.   hydrALAZINE (APRESOLINE) 25 MG tablet TAKE 1 TABLET BY MOUTH TWICE A DAY   metFORMIN (GLUCOPHAGE) 500 MG tablet Take one tablet by mouth twice daily   olmesartan-hydrochlorothiazide (BENICAR HCT) 40-12.5 MG tablet Take 1 tablet by mouth daily.   spironolactone (ALDACTONE) 25 MG tablet TAKE 1 TABLET (25 MG TOTAL) BY MOUTH DAILY.   [DISCONTINUED] triamcinolone (NASACORT) 55 MCG/ACT AERO nasal inhaler Place 2 sprays into the nose daily. 2 sprays each nostril at night   No facility-administered encounter medications on file as of 11/12/2022.    Allergies (verified) Ace inhibitors   History: Past Medical History:  Diagnosis Date   Di[place] mellitus without complication [place]    Hyperlipidemia 03/26/2011   HYPERTENSION 07/17/2010   Past Surgical History:  Procedure Laterality Date   COLONOSCOPY     11 yrs ago from 2021   [facility]  left   LIPOMA EXCISION Left 12/24/2013   Procedure: EXCISION MASS LEFT BACK;  Surgeon: BZenovia Jarred MD;  [facility]  Service: General;  Laterality: Left;   TONSILLECTOMY     Family History  Problem Relation Age of Onset   Hypertension Other    Hypertension Mother    Stroke Father    Colon cancer Neg Hx    Colon polyps Neg Hx    Esophageal cancer Neg Hx    Stomach cancer Neg  Hx    Rectal cancer Neg Hx    Social History   Socioeconomic History   Marital status: Married    Spouse name: Not on file   Number of children: Not on file   Years of education: Not on file   Highest education level: Bachelor's degree (e.g., BA, AB, BS)  Occupational History   Not on file  Tobacco Use   Smoking status: Never   Smokeless tobacco: Never  Vaping Use   Vaping Use: Never used  Substance and Sexual Activity   Alcohol use: Yes    Comment: occ   Drug use: No   Sexual activity: Not on file  Other Topics  Concern   Not on file  Social History Narrative   Not on file   Social Determinants of Health   Financial Resource Strain: Low Risk  (11/12/2022)   Overall Financial Resource Strain (CARDIA)    Difficulty of Paying Living Expenses: Not hard at all  Food Insecurity: No Food Insecurity (11/12/2022)   Hunger Vital Sign    Worried About Running Out of Food in the Last Year: Never true    Ran Out of Food in the Last Year: Never true  Transportation Needs: No Transportation Needs (11/12/2022)   PRAPARE - Hydrologist (Medical): No    Lack of Transportation (Non-Medical): No  Physical Activity: Sufficiently Active (11/12/2022)   Exercise Vital Sign    Days of Exercise per Week: 6 days    Minutes of Exercise per Session: 60 min  Stress: No Stress Concern Present (11/12/2022)   [place]    Feeling of Stress : Not at all  Social Connections: [place] (11/12/2022)   Social Connection and Isolation Panel [NHANES]    Frequency of Communication with Friends and Family: More than three times a week    Frequency of Social Gatherings with Friends and Family: More than three times a week    Attends Religious Services: More than 4 times per year    Active Member of [employer] or Organizations: Yes    Attends Music therapist: More than 4 times per year    Marital Status: Married    Tobacco Counseling Counseling given: Not Answered   Clinical Intake:  Pre-visit preparation completed: Yes  Pain : No/denies pain     BMI - recorded: 26.43 Nutritional Status: BMI 25 -29 Overweight Nutritional Risks: None Diabetes: No  How often do you need to have someone help you when you read instructions, pamphlets, or other written materials from your doctor or pharmacy?: 1 - Never  Diabetic?  Yes  Interpreter Needed?: No  Information entered by :: Rolene Arbour LPN   Activities  of Daily Living    11/12/2022   11:23 AM 11/11/2022   12:07 PM  In your present state of health, do you have any difficulty performing the following activities:  Hearing? 0 0  Vision? 0 0  Difficulty concentrating or making decisions? 0 0  Walking or climbing stairs? 0 0  Dressing or bathing? 0 0  Doing errands, shopping? 0 0  Preparing Food and eating ? N N  Using the Toilet? N N  In the past six months, have you accidently leaked urine? N N  Do you have problems with loss of bowel control? N N  Managing your Medications? N N  Managing your Finances? N N  Housekeeping or managing your Housekeeping? N N  Patient Care Team: Eulas Post, MD as PCP - General  Indicate any recent Medical Services you may have received from other than [facility] providers in the past year (date may be approximate).     Assessment:   This is a routine wellness examination for [place].  Hearing/Vision screen Hearing Screening - Comments:: Denies hearing difficulties   Vision Screening - Comments:: Wears reading glasses - up to date with routine eye exams with  Dr Idolina Primer  Dietary issues and exercise activities discussed: Current Exercise Habits: Home exercise routine, Type of exercise: walking, Time (Minutes): 60, Frequency (Times/Week): 6, Weekly Exercise (Minutes/Week): 360, Intensity: Moderate, Exercise limited by: None identified   Goals Addressed               This Visit's Progress     Increase physical activity (pt-stated)         Depression Screen    11/12/2022   11:22 AM 01/22/2022    5:03 PM 09/28/2021   11:24 AM 09/28/2021   11:21 AM 09/22/2020   11:30 AM 12/24/2019   10:16 AM 07/01/2018   10:19 AM  PHQ 2/9 Scores  PHQ - 2 Score 0 0 0 0 0 0 0  PHQ- 9 Score  3   0 0     Fall Risk    11/12/2022   11:23 AM 11/11/2022   12:07 PM 05/26/2022    6:17 PM 09/28/2021   11:23 AM 09/22/2020   11:30 AM  [place] in the past year? 0 0 0 0 0  Number falls in past yr: 0 0  0  0  Injury with Fall? 0 0  0 0  Risk for fall due to : No Fall Risks    No Fall Risks  Follow up Falls prevention discussed   Falls evaluation completed Falls evaluation completed;Falls prevention discussed    FALL RISK PREVENTION PERTAINING TO THE HOME:  Any stairs in or around the home? Yes  If so, are there any without handrails? No  Home free of loose throw rugs in walkways, pet beds, electrical cords, etc? Yes  Adequate lighting in your home to reduce risk of falls? Yes   ASSISTIVE DEVICES UTILIZED TO PREVENT FALLS:  Life alert? No  Use of a cane, walker or w/c? No  Grab bars in the bathroom? No  Shower chair or bench in shower? No  Elevated toilet seat or a handicapped toilet? No   TIMED UP AND GO:  Was the test performed? No . Audio Visit  Cognitive Function:        11/12/2022   11:24 AM  6CIT Screen  What Year? 0 points  What month? 0 points  What time? 0 points  Count back from 20 0 points  Months in reverse 0 points  Repeat phrase 0 points  Total Score 0 points    Immunizations Immunization History  Administered Date(s) Administered   Fluad Quad(high Dose 65+) 08/08/2021   Influenza Split 10/29/2011, 09/16/2012   Influenza Whole 08/07/2010   Influenza, High Dose Seasonal PF 01/01/2019   Influenza,inj,Quad PF,6+ Mos 10/14/2013, 08/27/2016   Moderna Sars-Covid-2 Vaccination 01/17/2020, 02/14/2020   Pneumococcal Conjugate-13 07/01/2018   Pneumococcal Polysaccharide-23 08/08/2021   Td 11/26/2003   Tdap 02/07/2016    TDAP status: Up to date  Flu Vaccine status: Due, Education has been provided regarding the importance of this vaccine. Advised may receive this vaccine at local pharmacy or Health Dept. Aware to provide a copy of  the vaccination record if obtained from local pharmacy or Health Dept. Verbalized acceptance and understanding.  Pneumococcal vaccine status: Up to date  Covid-19 vaccine status: Completed vaccines  Qualifies for Shingles  Vaccine? Yes   Zostavax completed No   Shingrix Completed?: No.    Education has been provided regarding the importance of this vaccine. Patient has been advised to call insurance company to determine out of pocket expense if they have not yet received this vaccine. Advised may also receive vaccine at local pharmacy or Health Dept. Verbalized acceptance and understanding.  Screening Tests Health Maintenance  Topic Date Due   FOOT EXAM  Never done   Diabetic kidney evaluation - Urine ACR  Never done   OPHTHALMOLOGY EXAM  06/05/2022   COVID-19 Vaccine (3 - Moderna risk series) 11/28/2022 (Originally 03/13/2020)   Zoster Vaccines- Shingrix (1 of 2) 02/11/2023 (Originally 11/08/1970)   INFLUENZA VACCINE  02/23/2023 (Originally 06/25/2022)   Diabetic kidney evaluation - eGFR measurement  02/20/2023   HEMOGLOBIN A1C  04/16/2023   Medicare Annual Wellness (AWV)  11/13/2023   DTaP/Tdap/Td (3 - Td or Tdap) 02/06/2026   COLONOSCOPY (Pts 45-9yr Insurance coverage will need to be confirmed)  01/10/2027   Pneumonia Vaccine 70 Years old  Completed   Hepatitis C Screening  Completed   HPV VACCINES  Aged Out    Health Maintenance  Health Maintenance Due  Topic Date Due   FOOT EXAM  Never done   Diabetic kidney evaluation - Urine ACR  Never done   OPHTHALMOLOGY EXAM  06/05/2022    Colorectal cancer screening: Type of screening: Colonoscopy. Completed 01/11/20. Repeat every 7 years  Lung Cancer Screening: (Low Dose CT Chest recommended if Age 70-80years, 30 pack-year currently smoking OR have quit w/in 15years.) does not qualify.    Additional Screening:  Hepatitis C Screening: does qualify; Completed 12/23/16  Vision Screening: Recommended annual ophthalmology exams for early detection of glaucoma and other disorders of the eye. Is the patient up to date with their annual eye exam?  Yes  Who is the provider or what is the name of the office in which the patient attends annual eye exams?  [facility] If pt is not established with a provider, would they like to be referred to a provider to establish care? No .   Dental Screening: Recommended annual dental exams for proper oral hygiene  Community Resource Referral / Chronic Care Management:  CRR required this visit?  No   CCM required this visit?  No      Plan:     I have personally reviewed and noted the following in the patient's chart:   Medical and social history Use of alcohol, tobacco or illicit drugs  Current medications and supplements including opioid prescriptions. Patient is not currently taking opioid prescriptions. Functional ability and status Nutritional status Physical activity Advanced directives List of other physicians Hospitalizations, surgeries, and ER visits in previous 12 months Vitals Screenings to include cognitive, depression, and falls Referrals and appointments  In addition, I have reviewed and discussed with patient certain preventive protocols, quality metrics, and best practice recommendations. A written personalized care plan for preventive services as well as general preventive health recommendations were provided to patient.     BCriselda Peaches LPN   [id number]  Nurse Notes: Patient due Diabetic kidney evaluation - Urine ACR

## 2022-11-12 NOTE — Patient Instructions (Addendum)
Jon Moses , Thank you for taking time to come for your Medicare Wellness Visit. I appreciate your ongoing commitment to your health goals. Please review the following plan we discussed and let me know if I can assist you in the future.   These are the goals we discussed:  Goals       Increase physical activity (pt-stated)      Patient Stated      I will continue to walk daily and ride my bike when the weather permits         This is a list of the screening recommended for you and due dates:  Health Maintenance  Topic Date Due   Complete foot exam   Never done   Yearly kidney health urinalysis for diabetes  Never done   Eye exam for diabetics  06/05/2022   COVID-19 Vaccine (3 - Moderna risk series) 11/28/2022*   Zoster (Shingles) Vaccine (1 of 2) 02/11/2023*   Flu Shot  02/23/2023*   Yearly kidney function blood test for diabetes  02/20/2023   Hemoglobin A1C  04/16/2023   Medicare Annual Wellness Visit  11/13/2023   DTaP/Tdap/Td vaccine (3 - Td or Tdap) 02/06/2026   Colon Cancer Screening  01/10/2027   Pneumonia Vaccine  Completed   Hepatitis C Screening: USPSTF Recommendation to screen - Ages 45-79 yo.  Completed   HPV Vaccine  Aged Out  *Topic was postponed. The date shown is not the original due date.    Advanced directives: Advance directive discussed with you today. Even though you declined this today, please call our office should you change your mind, and we can give you the proper paperwork for you to fill out.   Conditions/risks identified: None  Next appointment: Follow up in one year for your annual wellness visit.    Preventive Care 67 Years and Older, Male  Preventive care refers to lifestyle choices and visits with your health care provider that can promote health and wellness. What does preventive care include? A yearly physical exam. This is also called an annual well check. Dental exams once or twice a year. Routine eye exams. Ask your health care  provider how often you should have your eyes checked. Personal lifestyle choices, including: Daily care of your teeth and gums. Regular physical activity. Eating a healthy diet. Avoiding tobacco and drug use. Limiting alcohol use. Practicing safe sex. Taking low doses of aspirin every day. Taking vitamin and mineral supplements as recommended by your health care provider. What happens during an annual well check? The services and screenings done by your health care provider during your annual well check will depend on your age, overall health, lifestyle risk factors, and family history of disease. Counseling  Your health care provider may ask you questions about your: Alcohol use. Tobacco use. Drug use. Emotional well-being. Home and relationship well-being. Sexual activity. Eating habits. History of falls. Memory and ability to understand (cognition). Work and work Statistician. Screening  You may have the following tests or measurements: Height, weight, and BMI. Blood pressure. Lipid and cholesterol levels. These may be checked every 5 years, or more frequently if you are over 51 years old. Skin check. Lung cancer screening. You may have this screening every year starting at age 80 if you have a 30-pack-year history of smoking and currently smoke or have quit within the past 15 years. Fecal occult blood test (FOBT) of the stool. You may have this test every year starting at age 38. Flexible sigmoidoscopy  or colonoscopy. You may have a sigmoidoscopy every 5 years or a colonoscopy every 10 years starting at age 38. Prostate cancer screening. Recommendations will vary depending on your family history and other risks. Hepatitis C blood test. Hepatitis B blood test. Sexually transmitted disease (STD) testing. Diabetes screening. This is done by checking your blood sugar (glucose) after you have not eaten for a while (fasting). You may have this done every 1-3 years. Abdominal aortic  aneurysm (AAA) screening. You may need this if you are a current or former smoker. Osteoporosis. You may be screened starting at age 40 if you are at high risk. Talk with your health care provider about your test results, treatment options, and if necessary, the need for more tests. Vaccines  Your health care provider may recommend certain vaccines, such as: Influenza vaccine. This is recommended every year. Tetanus, diphtheria, and acellular pertussis (Tdap, Td) vaccine. You may need a Td booster every 10 years. Zoster vaccine. You may need this after age 32. Pneumococcal 13-valent conjugate (PCV13) vaccine. One dose is recommended after age 7. Pneumococcal polysaccharide (PPSV23) vaccine. One dose is recommended after age 36. Talk to your health care provider about which screenings and vaccines you need and how often you need them. This information is not intended to replace advice given to you by your health care provider. Make sure you discuss any questions you have with your health care provider. Document Released: 12/08/2015 Document Revised: 07/31/2016 Document Reviewed: 09/12/2015 Elsevier Interactive Patient Education  2017 [place] Prevention in the Home Falls can cause injuries. They can happen to people of all ages. There are many things you can do to make your home safe and to help prevent falls. What can I do on the outside of my home? Regularly fix the edges of walkways and driveways and fix any cracks. Remove anything that might make you trip as you walk through a door, such as a raised step or threshold. Trim any bushes or trees on the path to your home. Use bright outdoor lighting. Clear any walking paths of anything that might make someone trip, such as rocks or tools. Regularly check to see if handrails are loose or broken. Make sure that both sides of any steps have handrails. Any raised decks and porches should have guardrails on the edges. Have any leaves,  snow, or ice cleared regularly. Use sand or salt on walking paths during winter. Clean up any spills in your garage right away. This includes oil or grease spills. What can I do in the bathroom? Use night lights. Install grab bars by the toilet and in the tub and shower. Do not use towel bars as grab bars. Use non-skid mats or decals in the tub or shower. If you need to sit down in the shower, use a plastic, non-slip stool. Keep the floor dry. Clean up any water that spills on the floor as soon as it happens. Remove soap buildup in the tub or shower regularly. Attach bath mats securely with double-sided non-slip rug tape. Do not have throw rugs and other things on the floor that can make you trip. What can I do in the bedroom? Use night lights. Make sure that you have a light by your bed that is easy to reach. Do not use any sheets or blankets that are too big for your bed. They should not hang down onto the floor. Have a firm chair that has side arms. You can use this for support  while you get dressed. Do not have throw rugs and other things on the floor that can make you trip. What can I do in the kitchen? Clean up any spills right away. Avoid walking on wet floors. Keep items that you use a lot in easy-to-reach places. If you need to reach something above you, use a strong step stool that has a grab bar. Keep electrical cords out of the way. Do not use floor polish or wax that makes floors slippery. If you must use wax, use non-skid floor wax. Do not have throw rugs and other things on the floor that can make you trip. What can I do with my stairs? Do not leave any items on the stairs. Make sure that there are handrails on both sides of the stairs and use them. Fix handrails that are broken or loose. Make sure that handrails are as long as the stairways. Check any carpeting to make sure that it is firmly attached to the stairs. Fix any carpet that is loose or worn. Avoid having throw  rugs at the top or bottom of the stairs. If you do have throw rugs, attach them to the floor with carpet tape. Make sure that you have a light switch at the top of the stairs and the bottom of the stairs. If you do not have them, ask someone to add them for you. What else can I do to help prevent falls? Wear shoes that: Do not have high heels. Have rubber bottoms. Are comfortable and fit you well. Are closed at the toe. Do not wear sandals. If you use a stepladder: Make sure that it is fully opened. Do not climb a closed stepladder. Make sure that both sides of the stepladder are locked into place. Ask someone to hold it for you, if possible. Clearly mark and make sure that you can see: Any grab bars or handrails. First and last steps. Where the edge of each step is. Use tools that help you move around (mobility aids) if they are needed. These include: Canes. Walkers. Scooters. Crutches. Turn on the lights when you go into a dark area. Replace any light bulbs as soon as they burn out. Set up your furniture so you have a clear path. Avoid moving your furniture around. If any of your floors are uneven, fix them. If there are any pets around you, be aware of where they are. Review your medicines with your doctor. Some medicines can make you feel dizzy. This can increase your chance of falling. Ask your doctor what other things that you can do to help prevent falls. This information is not intended to replace advice given to you by your health care provider. Make sure you discuss any questions you have with your health care provider. Document Released: 09/07/2009 Document Revised: 04/18/2016 Document Reviewed: 12/16/2014 Elsevier Interactive Patient Education  2017 [employer].

## 2022-11-20 ENCOUNTER — Telehealth: Payer: Self-pay | Admitting: Family Medicine

## 2022-11-20 NOTE — Telephone Encounter (Signed)
Pt is calling and would like to know if he is missing any vaccines

## 2022-11-20 NOTE — Telephone Encounter (Signed)
Patient informed of the message and verbalized understanding 

## 2022-12-11 ENCOUNTER — Other Ambulatory Visit: Payer: Self-pay | Admitting: Family Medicine

## 2022-12-18 DIAGNOSIS — Z7984 Long term (current) use of oral hypoglycemic drugs: Secondary | ICD-10-CM | POA: Diagnosis not present

## 2022-12-18 DIAGNOSIS — I2510 Atherosclerotic heart disease of native coronary artery without angina pectoris: Secondary | ICD-10-CM | POA: Diagnosis not present

## 2022-12-18 DIAGNOSIS — E119 Type 2 diabetes mellitus without complications: Secondary | ICD-10-CM | POA: Diagnosis not present

## 2022-12-18 DIAGNOSIS — G4700 Insomnia, unspecified: Secondary | ICD-10-CM | POA: Diagnosis not present

## 2022-12-18 DIAGNOSIS — E785 Hyperlipidemia, unspecified: Secondary | ICD-10-CM | POA: Diagnosis not present

## 2022-12-18 DIAGNOSIS — Z7982 Long term (current) use of aspirin: Secondary | ICD-10-CM | POA: Diagnosis not present

## 2022-12-18 DIAGNOSIS — I10 Essential (primary) hypertension: Secondary | ICD-10-CM | POA: Diagnosis not present

## 2022-12-18 DIAGNOSIS — Z8249 Family history of ischemic heart disease and other diseases of the circulatory system: Secondary | ICD-10-CM | POA: Diagnosis not present

## 2022-12-24 ENCOUNTER — Encounter: Payer: Self-pay | Admitting: *Deleted

## 2022-12-24 ENCOUNTER — Telehealth: Payer: Self-pay | Admitting: *Deleted

## 2022-12-24 ENCOUNTER — Other Ambulatory Visit: Payer: Self-pay | Admitting: Family Medicine

## 2022-12-24 NOTE — Patient Outreach (Signed)
  Care Coordination   Initial Visit Note   12/24/2022 Name: Jon Moses MRN: [id number] DOB: Feb 22, 1951  Jon Moses is a 72 y.o. year old male who sees Burchette, Alinda Sierras, MD for primary care. I spoke with  Felix Pacini by phone today.  What matters to the patients health and wellness today?  Left hip stiffness    Goals Addressed               This Visit's Progress     COMPLETED: Left hip stiffness (pt-stated)        Care Coordination Interventions: Advised patient to consult with Primary provider on tomorrow's appointment for options to intervene with his residual stiffness to his left hip since his stroke as indicated by pt.  Reviewed medications with patient and discussed adherence with no needed refills  Collaborated with Dr. Carolann Littler regarding Left hip stiffness Reviewed scheduled/upcoming provider appointments including sufficient transportation source Screening for signs and symptoms of depression related to chronic disease state  Assessed social determinant of health barriers Educated on care management services with no immediate needs          SDOH assessments and interventions completed:  Yes  SDOH Interventions Today    Flowsheet Row Most Recent Value  SDOH Interventions   Food Insecurity Interventions Intervention Not Indicated  Housing Interventions Intervention Not Indicated  Transportation Interventions Intervention Not Indicated  Utilities Interventions Intervention Not Indicated        Care Coordination Interventions:  Yes, provided   Follow up plan: No further intervention required.   Encounter Outcome:  Pt. Visit Completed   Raina Mina, RN Care Management Coordinator [place] Office [phone]

## 2022-12-24 NOTE — Patient Instructions (Signed)
Visit Information  Thank you for taking time to visit with me today. Please don't hesitate to contact me if I can be of assistance to you.   Following are the goals we discussed today:   Goals Addressed               This Visit's Progress     COMPLETED: Left hip stiffness (pt-stated)        Care Coordination Interventions: Advised patient to consult with Primary provider on tomorrow's appointment for options to intervene with his residual stiffness to his left hip since his stroke as indicated by pt.  Reviewed medications with patient and discussed adherence with no needed refills  Collaborated with Dr. Carolann Littler regarding Left hip stiffness Reviewed scheduled/upcoming provider appointments including sufficient transportation source Screening for signs and symptoms of depression related to chronic disease state  Assessed social determinant of health barriers Educated on care management services with no immediate needs          Please call the care guide team at [phone] if you need to cancel or reschedule your appointment.   If you are experiencing a Mental Health or [place] or need someone to talk to, please call the Suicide and Crisis Lifeline: [phone]  Patient verbalizes understanding of instructions and care plan provided today and agrees to view in [place]. Active MyChart status and patient understanding of how to access instructions and care plan via MyChart confirmed with patient.     No further follow up required: No needs  Raina Mina, RN Care Management Coordinator [place] Office [phone]

## 2022-12-25 ENCOUNTER — Encounter: Payer: Self-pay | Admitting: Family Medicine

## 2022-12-25 ENCOUNTER — Ambulatory Visit (INDEPENDENT_AMBULATORY_CARE_PROVIDER_SITE_OTHER): Payer: Medicare HMO | Admitting: Family Medicine

## 2022-12-25 VITALS — BP 116/70 | HR 81 | Temp 97.8°F | Ht 69.0 in | Wt 178.3 lb

## 2022-12-25 DIAGNOSIS — I10 Essential (primary) hypertension: Secondary | ICD-10-CM | POA: Diagnosis not present

## 2022-12-25 DIAGNOSIS — E785 Hyperlipidemia, unspecified: Secondary | ICD-10-CM

## 2022-12-25 DIAGNOSIS — E1165 Type 2 diabetes mellitus with hyperglycemia: Secondary | ICD-10-CM

## 2022-12-25 DIAGNOSIS — M6289 Other specified disorders of muscle: Secondary | ICD-10-CM | POA: Diagnosis not present

## 2022-12-25 LAB — COMPREHENSIVE METABOLIC PANEL
ALT: 20 U/L (ref 0–53)
AST: 16 U/L (ref 0–37)
Albumin: 4.5 g/dL (ref 3.5–5.2)
Alkaline Phosphatase: 51 U/L (ref 39–117)
BUN: 21 mg/dL (ref 6–23)
CO2: 26 mEq/L (ref 19–32)
Calcium: 9.5 mg/dL (ref 8.4–10.5)
Chloride: 102 mEq/L (ref 96–112)
Creatinine, Ser: 1.52 mg/dL — ABNORMAL HIGH (ref 0.40–1.50)
GFR: 46.26 mL/min — ABNORMAL LOW (ref >60.00)
Glucose, Bld: 99 mg/dL (ref 70–99)
Potassium: 4.8 mEq/L (ref 3.5–5.1)
Sodium: 135 mEq/L (ref 135–145)
Total Bilirubin: 0.4 mg/dL (ref 0.2–1.2)
Total Protein: 7.6 g/dL (ref 6.0–8.3)

## 2022-12-25 LAB — LIPID PANEL
Cholesterol: 123 mg/dL (ref 0–200)
HDL: 45.5 mg/dL (ref >39.00)
LDL Cholesterol: 54 mg/dL (ref 0–99)
NonHDL: 77.56
Total CHOL/HDL Ratio: 3
Triglycerides: 120 mg/dL (ref 0.0–149.0)
VLDL: 24 mg/dL (ref 0.0–40.0)

## 2022-12-25 LAB — MICROALBUMIN / CREATININE URINE RATIO
Creatinine,U: 217.1 mg/dL
Microalb Creat Ratio: 1.1 mg/g (ref 0.0–30.0)
Microalb, Ur: 2.4 mg/dL — ABNORMAL HIGH (ref 0.0–1.9)

## 2022-12-25 NOTE — Progress Notes (Signed)
Established Patient Office Visit  Subjective   Patient ID: Jon Moses, male    DOB: Apr 13, 1951  Age: 72 y.o. MRN: [id number]  Chief Complaint  Patient presents with   Hip Pain    Patient complains of left hip pain,     HPI   Jon Moses is here for medical follow-up.  He has history of hypertension, hyperlipidemia, type 2 diabetes, and history of stroke over a year ago.  He had some left-sided weakness associated with his stroke and fortunately has recovered his strength but is complaining of some stiffness especially in his left hip and back which is worse with things like cold weather.  Limiting activities.  Prior to his stroke, he usually walks about 4 to 5 miles [place]  He struggles to walk much shorter distances now.  He had some initial physical therapy after his stroke but none since then.  Denies any radiculitis symptoms.  Denies any significant lower extremity weakness.  He had hip x-rays showed mild to moderate osteoarthritis in both hips but he is not really having any significant hip pain.  His blood pressure is currently treated with amlodipine, hydralazine, Benicar HCTZ, and Aldactone.  Blood pressures have been stable and adequately controlled.  Last A1c couple months ago 6.1%.  He has hyperlipidemia treated with atorvastatin 40 mg daily.  Does need some follow-up labs for lipids and chemistries.  Denies any recent falls.  Past Medical History:  Diagnosis Date   Diabetes mellitus without complication ([place])    Hyperlipidemia 03/26/2011   HYPERTENSION 07/17/2010   Past Surgical History:  Procedure Laterality Date   COLONOSCOPY     11 yrs ago from 2021   [place]   left   LIPOMA EXCISION Left 12/24/2013   Procedure: EXCISION MASS LEFT BACK;  Surgeon: Zenovia Jarred, MD;  Location: [facility];  Service: General;  Laterality: Left;   TONSILLECTOMY      reports that he has never smoked. He has never used smokeless  tobacco. He reports current alcohol use. He reports that he does not use drugs. family history includes Hypertension in his mother and another family member; Stroke in his father. Allergies  Allergen Reactions   Ace Inhibitors Cough    Review of Systems  Constitutional:  Negative for malaise/fatigue.  Eyes:  Negative for blurred vision.  Respiratory:  Negative for shortness of breath.   Cardiovascular:  Negative for chest pain.  Gastrointestinal:  Negative for abdominal pain.  Musculoskeletal:  Negative for back pain and falls.  Neurological:  Negative for dizziness, weakness and headaches.      Objective:     BP 116/70 (BP Location: Left Arm, Patient Position: Sitting, Cuff Size: Normal)   Pulse 81   Temp 97.8 F (36.6 C) (Oral)   Ht '5\' 9"'$  (1.753 m)   Wt 178 lb 4.8 oz (80.9 kg)   SpO2 98%   BMI 26.33 kg/m    Physical Exam Constitutional:      Appearance: He is well-developed.  HENT:     Right Ear: External ear normal.     Left Ear: External ear normal.  Eyes:     Pupils: Pupils are equal, round, and reactive to light.  Neck:     Thyroid: No thyromegaly.  Cardiovascular:     Rate and Rhythm: Normal rate and regular rhythm.  Pulmonary:     Effort: Pulmonary effort is normal. No respiratory distress.  Breath sounds: Normal breath sounds. No wheezing or rales.  Musculoskeletal:     Cervical back: Neck supple.     Comments: Fairly good range of motion left hip.  No pain with internal or external rotation.  No difficulties with back flexion at 90 degrees or back extension 20 degrees.  He is able to touch his toes without much difficulty.  Neurological:     Mental Status: He is alert and oriented to person, place, and time.     Comments: Full strength lower extremities with plantarflexion, dorsiflexion, knee extension, and hip flexion bilaterally.      No results found for any visits on 12/25/22.    The ASCVD Risk score (Arnett DK, et al., 2019) failed to  calculate for the following reasons:   The patient has a prior MI or stroke diagnosis    Assessment & Plan:   #1 increased muscle stiffness following stroke over a year ago.  He feels like this is predominantly in his left lumbar region with some radiation toward the hip.  We did discuss referral for physical therapy and will go ahead and get this set up as an outpatient.  Would avoid muscle relaxers as much as possible with his age and other co-morbidities  #2 hypertension stable and well-controlled.  Continue combination therapy with amlodipine, Aldactone, Benicar HCTZ, and hydralazine.  #3 hyperlipidemia.  Patient on high-dose statin with Lipitor.  Recheck lipid and CMP  #4 type 2 diabetes well-controlled with recent A1c 6.1%.  This was just couple months ago.  Recheck A1c at 28-monthfollow-up Return in about 4 months (around 04/25/2023).    BCarolann Littler MD

## 2022-12-26 ENCOUNTER — Telehealth: Payer: Self-pay | Admitting: Family Medicine

## 2022-12-26 DIAGNOSIS — M6289 Other specified disorders of muscle: Secondary | ICD-10-CM

## 2022-12-26 NOTE — Telephone Encounter (Signed)
Please see result note 

## 2022-12-26 NOTE — Telephone Encounter (Addendum)
Pt was called and he does not want to want until the end of feb to have physical therapy  at [facility] and would like another referral for physical therapy for left lumbar and left hip stiffness to another facility

## 2022-12-26 NOTE — Telephone Encounter (Signed)
Pt received his results and was told his kidney function is a little low and per pt he would like to know what to do

## 2022-12-27 NOTE — Telephone Encounter (Signed)
Referral placed and left message for the patient to return my call.

## 2022-12-27 NOTE — Telephone Encounter (Signed)
Patient aware.

## 2022-12-27 NOTE — Telephone Encounter (Signed)
Pt is returning your call I told pt you are at lunch

## 2022-12-27 NOTE — Telephone Encounter (Signed)
Left a message for the patient to return my call.  

## 2022-12-27 NOTE — Telephone Encounter (Signed)
Patient returned call

## 2023-01-02 ENCOUNTER — Ambulatory Visit: Payer: Medicare HMO | Attending: Family Medicine

## 2023-01-02 ENCOUNTER — Telehealth: Payer: Self-pay

## 2023-01-02 DIAGNOSIS — M5459 Other low back pain: Secondary | ICD-10-CM | POA: Insufficient documentation

## 2023-01-02 DIAGNOSIS — M6281 Muscle weakness (generalized): Secondary | ICD-10-CM | POA: Insufficient documentation

## 2023-01-02 DIAGNOSIS — M25552 Pain in left hip: Secondary | ICD-10-CM | POA: Insufficient documentation

## 2023-01-02 DIAGNOSIS — R252 Cramp and spasm: Secondary | ICD-10-CM | POA: Insufficient documentation

## 2023-01-02 DIAGNOSIS — R262 Difficulty in walking, not elsewhere classified: Secondary | ICD-10-CM | POA: Insufficient documentation

## 2023-01-02 NOTE — Telephone Encounter (Signed)
Patient did not show for evaluation today.  Call placed to see if he needed to reschedule and to see if status had changed.  No answer but was able to leave a voice mail.  Requested he give [place] a call to let us know if he needed to reschedule.

## 2023-01-02 NOTE — Therapy (Deleted)
OUTPATIENT PHYSICAL THERAPY LOWER EXTREMITY EVALUATION   Patient Name: Jon Moses MRN: [id number] DOB:[birth year], 72 y.o., male Today's Date: 01/02/2023  END OF SESSION:   Past Medical History:  Diagnosis Date   Diabetes mellitus without complication ([place])    Hyperlipidemia 03/26/2011   HYPERTENSION 07/17/2010   Past Surgical History:  Procedure Laterality Date   COLONOSCOPY     11 yrs ago from 2021   [place]   left   LIPOMA EXCISION Left 12/24/2013   Procedure: EXCISION MASS LEFT BACK;  Surgeon: Zenovia Jarred, MD;  Location: [facility];  Service: General;  Laterality: Left;   TONSILLECTOMY     Patient Active Problem List   Diagnosis Date Noted   History of CVA (cerebrovascular accident) 12/19/2021   Retinal hemorrhage, left 08/27/2016   Type 2 diabetes mellitus with hyperglycemia ([place]) 02/07/2016   Hyperlipidemia 03/26/2011   Essential hypertension 07/17/2010    PCP: Eulas Post, MD  REFERRING PROVIDER: Eulas Post, MD  REFERRING DIAG: M62.89 (ICD-10-CM) - Muscle stiffness  THERAPY DIAG:  Pain in left hip  Other low back pain  Difficulty in walking, not elsewhere classified  Muscle weakness (generalized)  Cramp and spasm  Rationale for Evaluation and Treatment: Rehabilitation  ONSET DATE: 12/27/2022  SUBJECTIVE:   SUBJECTIVE STATEMENT: ***  PERTINENT HISTORY: Recent MD note from Dr. Elease Hashimoto: Jon Moses is here for medical follow-up. He has history of hypertension, hyperlipidemia, type 2 diabetes, and history of stroke over a year ago. He had some left-sided weakness associated with his stroke and fortunately has recovered his strength but is complaining of some stiffness especially in his left hip and back which is worse with things like cold weather. Limiting activities. Prior to his stroke, he usually walks about 4 to 5 miles per day [place]. He struggles to walk much shorter distances  now. He had some initial physical therapy after his stroke but none since then. Denies any radiculitis symptoms. Denies any significant lower extremity weakness. He had hip x-rays showed mild to moderate osteoarthritis in both hips but he is not really having any significant hip pain. PAIN:  Are you having pain? ***  PRECAUTIONS: None  WEIGHT BEARING RESTRICTIONS: No  FALLS:  Has patient fallen in last 6 months? {fallsyesno:27318}  LIVING ENVIRONMENT: Lives with: {OPRC lives with:25569::"lives with their family"} Lives in: {Lives in:25570} Stairs: {opstairs:27293} Has following equipment at home: {Assistive devices:23999}  OCCUPATION: ***  PLOF: {PLOF:24004}  PATIENT GOALS: ***  NEXT MD VISIT: prn  OBJECTIVE:   DIAGNOSTIC FINDINGS:  IMPRESSION: 1. No acute fracture or dislocation. 2. Moderate left and mild right degenerative changes of the hips. 3. Indeterminate heterogeneous sclerotic lesion measuring 3.5 cm in the right femoral neck. This can be further evaluated with CT or MRI.  Electronically Signed   By: Ronney Asters M.D.   On: 04/11/2022 23:12  PATIENT SURVEYS:  FOTO ***  COGNITION: Overall cognitive status: Within functional limits for tasks assessed     SENSATION: WFL  MUSCLE LENGTH: Hamstrings: Right *** deg; Left *** deg Marcello Moores test: Right *** deg; Left *** deg  POSTURE: {posture:25561}  PALPATION: ***  LOWER EXTREMITY ROM:  Active ROM Right eval Left eval  Hip flexion    Hip extension    Hip abduction    Hip adduction    Hip internal rotation    Hip external rotation    Knee flexion    Knee extension  Ankle dorsiflexion    Ankle plantarflexion    Ankle inversion    Ankle eversion     (Blank rows = not tested)  LOWER EXTREMITY MMT:  MMT Right eval Left eval  Hip flexion    Hip extension    Hip abduction    Hip adduction    Hip internal rotation    Hip external rotation    Knee flexion    Knee extension    Ankle  dorsiflexion    Ankle plantarflexion    Ankle inversion    Ankle eversion     (Blank rows = not tested)  LOWER EXTREMITY SPECIAL TESTS:  Hip special tests: Marcello Moores test: {pos/neg:25230} and Ober's test: {pos/neg:25230}  FUNCTIONAL TESTS:  5 times sit to stand: *** Timed up and go (TUG): ***  GAIT: Distance walked: *** Assistive device utilized: {Assistive devices:23999} Level of assistance: {Levels of assistance:24026} Comments: ***   TODAY'S TREATMENT:                                                                                                                              DATE: 01/02/23 Initial eval completed and initiated HEP    PATIENT EDUCATION:  Education details: Initiated HEP Person educated: Patient Education method: Consulting civil engineer, Media planner, Verbal cues, and Handouts Education comprehension: verbalized understanding, returned demonstration, and verbal cues required  HOME EXERCISE PROGRAM: ***  ASSESSMENT:  CLINICAL IMPRESSION: Patient is a 72 y.o. male who was seen today for physical therapy evaluation and treatment for left hip and low back pain.  He presents with   OBJECTIVE IMPAIRMENTS: Abnormal gait, decreased activity tolerance, decreased mobility, difficulty walking, decreased ROM, decreased strength, hypomobility, increased fascial restrictions, increased muscle spasms, impaired flexibility, and pain.   ACTIVITY LIMITATIONS: carrying, lifting, bending, standing, squatting, stairs, transfers, bed mobility, bathing, and dressing  PARTICIPATION LIMITATIONS: meal prep, cleaning, laundry, shopping, community activity, yard work, and church  PERSONAL FACTORS: Fitness, Past/current experiences, and 1-2 comorbidities: Htn and diabetes  are also affecting patient's functional outcome.   REHAB POTENTIAL: Good  CLINICAL DECISION MAKING: Stable/uncomplicated  EVALUATION COMPLEXITY: Low   GOALS: Goals reviewed with patient? Yes  SHORT TERM GOALS: Target  date: 01/30/2023   Pain report to be no greater than 4/10  Baseline: Goal status: INITIAL  2.  Patient will be independent with initial HEP  Baseline:  Goal status: INITIAL  3.  Patient to be able to walk 5 minutes longer than he is currently walking Baseline:  Goal status: INITIAL    LONG TERM GOALS: Target date: 02/27/2023    Patient to report pain no greater than 2/10  Baseline:  Goal status: INITIAL  2.  Patient to be independent with advanced HEP  Baseline:  Goal status: INITIAL  3.  Patient to be able to walk for at least 30 min without pain Baseline:  Goal status: INITIAL  4.  TUG and 5 times sit to stand to improve by 2-3 seconds Baseline:  Goal status: INITIAL  5.  FOTO to improve to predicted outcome Baseline:  Goal status: INITIAL  6.  Patient to report 85% improvement in overall symptoms  Baseline:  Goal status: INITIAL   PLAN:  PT FREQUENCY: 1-2x/week  PT DURATION: 8 weeks  PLANNED INTERVENTIONS: Therapeutic exercises, Therapeutic activity, Neuromuscular re-education, Balance training, Gait training, Patient/Family education, Self Care, Joint mobilization, Stair training, DME instructions, Aquatic Therapy, Dry Needling, Electrical stimulation, Cryotherapy, Moist heat, Taping, Traction, Ultrasound, Ionotophoresis '4mg'$ /ml Dexamethasone, Manual therapy, and Re-evaluation  PLAN FOR NEXT SESSION: Review HEP, Nustep, 4 way hip strengthening and core strengthening.    Isabel Caprice, PT 01/02/2023, 8:06 AM

## 2023-01-25 ENCOUNTER — Other Ambulatory Visit: Payer: Self-pay | Admitting: Family Medicine

## 2023-01-26 ENCOUNTER — Other Ambulatory Visit: Payer: Self-pay | Admitting: Family Medicine

## 2023-01-30 ENCOUNTER — Encounter: Payer: Self-pay | Admitting: Family Medicine

## 2023-01-30 ENCOUNTER — Telehealth (INDEPENDENT_AMBULATORY_CARE_PROVIDER_SITE_OTHER): Payer: Medicare HMO | Admitting: Family Medicine

## 2023-01-30 DIAGNOSIS — K0889 Other specified disorders of teeth and supporting structures: Secondary | ICD-10-CM

## 2023-01-30 MED ORDER — AMOXICILLIN-POT CLAVULANATE 875-125 MG PO TABS: 1.0000 | ORAL_TABLET | Freq: Two times a day (BID) | ORAL | 0 refills | Status: DC

## 2023-01-30 NOTE — Progress Notes (Signed)
Virtual Visit via Video Note  I connected with [place]  on 01/30/23 at  6:00 PM EST by a video enabled telemedicine application and verified that I am speaking with the correct person using two identifiers.  Location patient: [place] Location provider:work or home office Persons participating in the virtual visit: patient, provider  I discussed the limitations and requested verbal permission for telemedicine visit. The patient expressed understanding and agreed to proceed.   HPI:  Acute telemedicine visit for Dental Pain:  -Onset: 2-3 days ago -Symptoms include: pain/sensitivity when taps on this tooth and just with chewing -Denies:swelling, drainage from area, pitting, fevers, trismus, sinus issues -Has tried: asa, salt water rinses -Pertinent past medical history: see below, lost a filling in this tooth a few months ago, reports hx of dental issues and is requesting abx, reports talked with his dental office and they wont see him for 11 days and he wants to take abx -Pertinent medication allergies: Allergies  Allergen Reactions   Ace Inhibitors Cough   -COVID-19 vaccine status:  Immunization History  Administered Date(s) Administered   Fluad Quad(high Dose 65+) 08/08/2021   Influenza Split 10/29/2011, 09/16/2012   Influenza Whole 08/07/2010   Influenza, High Dose Seasonal PF 01/01/2019   Influenza,inj,Quad PF,6+ Mos 10/14/2013, 08/27/2016   Moderna Sars-Covid-2 Vaccination 01/17/2020, 02/14/2020   Pneumococcal Conjugate-13 07/01/2018   Pneumococcal Polysaccharide-23 08/08/2021   Td 11/26/2003   Tdap 02/07/2016     ROS: See pertinent positives and negatives per HPI.  Past Medical History:  Diagnosis Date   Diabetes mellitus without complication ([place])    Hyperlipidemia 03/26/2011   HYPERTENSION 07/17/2010    Past Surgical History:  Procedure Laterality Date   COLONOSCOPY     11 yrs ago from 2021   [place]   left   LIPOMA EXCISION Left 12/24/2013    Procedure: EXCISION MASS LEFT BACK;  Surgeon: Zenovia Jarred, MD;  Location: [facility];  Service: General;  Laterality: Left;   TONSILLECTOMY       Current Outpatient Medications:    amLODipine (NORVASC) 10 MG tablet, Take 1 tablet (10 mg total) by mouth daily., Disp: 90 tablet, Rfl: 3   amoxicillin-clavulanate (AUGMENTIN) 875-125 MG tablet, Take 1 tablet by mouth 2 (two) times daily., Disp: 10 tablet, Rfl: 0   aspirin EC 81 MG tablet, Take 81 mg by mouth daily. Swallow whole., Disp: , Rfl:    atorvastatin (LIPITOR) 40 MG tablet, TAKE 1 TABLET BY MOUTH EVERY DAY, Disp: 90 tablet, Rfl: 1   hydrALAZINE (APRESOLINE) 25 MG tablet, TAKE 1 TAB TWICE DAILY, Disp: 180 tablet, Rfl: 1   metFORMIN (GLUCOPHAGE) 500 MG tablet, TAKE 1 TABLET BY MOUTH TWICE A DAY, Disp: 180 tablet, Rfl: 3   olmesartan-hydrochlorothiazide (BENICAR HCT) 40-12.5 MG tablet, Take 1 tablet by mouth daily., Disp: 90 tablet, Rfl: 3   spironolactone (ALDACTONE) 25 MG tablet, TAKE 1 TABLET (25 MG TOTAL) BY MOUTH DAILY., Disp: 90 tablet, Rfl: 0  EXAM:  VITALS per patient if applicable:  GENERAL: alert, oriented, appears well and in no acute distress  HEENT: atraumatic, conjunttiva clear, no obvious abnormalities on inspection of external nose and ears, able to open and close jaw normally, no drooling, points to top R canine as area of pain, video quality is not great, does not appear to have swelling or pitting around the tooth on limited exam and he denies any  NECK: normal movements of the head and neck  LUNGS: on inspection  no signs of respiratory distress, breathing rate appears normal, no obvious gross SOB, gasping or wheezing  CV: no obvious cyanosis  MS: moves all visible extremities without noticeable abnormality  PSYCH/NEURO: pleasant and cooperative, no obvious depression or anxiety, speech and thought processing grossly intact  ASSESSMENT AND PLAN:  Discussed the following assessment and  plan:  Pain, dental  -we discussed possible serious and likely etiologies, options for evaluation and workup, limitations of telemedicine visit vs in person visit, treatment, treatment risks and precautions. Advised inperson dental evaluation, but he has not been able to get an appt with his dentist until 11 days from now and is requesting abx. Discussed risks with abx and that etiology of pain is unknown at this point, he would like to proceed in case of mild dental infection. He agrees to continue to pursue more prompt dental appt and I provided numbers for several urgent dental options in his area. He agrees to call tomorrow. Suprisingly - the majority of ental offices in his area are close for 3 day weekend. Discussed options for pain and opted for tylenol given his renal function - did advise of potential for reanl issues with tylenol as well but is low than with nsaids.  Advised to seek prompt virtual visit or in person care if worsening, alarm symptoms (discussed),new symptoms arise, or if is not improving with treatment as expected per our conversation of expected course. Discussed options for follow up care. Did let this patient know that I do telemedicine on Tuesdays and Thursdays for [facility] and those are the days I am logged into the system. Advised to schedule follow up visit with PCP, [facility] virtual visits or [facility] if any further questions or concerns to avoid delays in care.   I discussed the assessment and treatment plan with the patient. The patient was provided an opportunity to ask questions and all were answered. The patient agreed with the plan and demonstrated an understanding of the instructions.     Lucretia Kern, DO

## 2023-01-30 NOTE — Patient Instructions (Signed)
-  I sent the medication(s) we discussed to your pharmacy: Meds ordered this encounter  Medications   amoxicillin-clavulanate (AUGMENTIN) 875-125 MG tablet    Sig: Take 1 tablet by mouth 2 (two) times daily.    Dispense:  10 tablet    Refill:  0     I hope you are feeling better soon!  Seek in person care promptly if your symptoms worsen, new concerns arise or you are not improving with treatment.  It was nice to meet you today. I help [facility] out with telemedicine visits on Tuesdays and Thursdays and am happy to help if you need a virtual follow up visit on those days. Otherwise, if you have any concerns or questions following this visit please schedule a follow up visit with your Primary Care office or seek care at a local urgent care clinic to avoid delays in care. If you are having severe or life threatening symptoms please call 911 and/or go to the nearest emergency room.

## 2023-02-03 ENCOUNTER — Other Ambulatory Visit: Payer: Self-pay | Admitting: Family Medicine

## 2023-02-06 ENCOUNTER — Other Ambulatory Visit: Payer: Self-pay | Admitting: Family Medicine

## 2023-02-06 DIAGNOSIS — I10 Essential (primary) hypertension: Secondary | ICD-10-CM

## 2023-03-23 ENCOUNTER — Other Ambulatory Visit: Payer: Self-pay | Admitting: Family Medicine

## 2023-03-23 DIAGNOSIS — I10 Essential (primary) hypertension: Secondary | ICD-10-CM

## 2023-03-28 ENCOUNTER — Ambulatory Visit: Payer: Medicare HMO | Admitting: Family Medicine

## 2023-03-28 ENCOUNTER — Encounter: Payer: Self-pay | Admitting: Family Medicine

## 2023-03-28 ENCOUNTER — Ambulatory Visit (INDEPENDENT_AMBULATORY_CARE_PROVIDER_SITE_OTHER): Payer: Medicare HMO | Admitting: Family Medicine

## 2023-03-28 ENCOUNTER — Ambulatory Visit (INDEPENDENT_AMBULATORY_CARE_PROVIDER_SITE_OTHER): Payer: Medicare HMO

## 2023-03-28 VITALS — BP 124/68 | HR 80 | Temp 98.0°F | Ht 69.0 in | Wt 181.4 lb

## 2023-03-28 DIAGNOSIS — M25552 Pain in left hip: Secondary | ICD-10-CM

## 2023-03-28 DIAGNOSIS — I10 Essential (primary) hypertension: Secondary | ICD-10-CM | POA: Diagnosis not present

## 2023-03-28 DIAGNOSIS — R200 Anesthesia of skin: Secondary | ICD-10-CM

## 2023-03-28 DIAGNOSIS — I69398 Other sequelae of cerebral infarction: Secondary | ICD-10-CM

## 2023-03-28 DIAGNOSIS — Z7984 Long term (current) use of oral hypoglycemic drugs: Secondary | ICD-10-CM | POA: Diagnosis not present

## 2023-03-28 DIAGNOSIS — E785 Hyperlipidemia, unspecified: Secondary | ICD-10-CM

## 2023-03-28 DIAGNOSIS — Z8673 Personal history of transient ischemic attack (TIA), and cerebral infarction without residual deficits: Secondary | ICD-10-CM

## 2023-03-28 DIAGNOSIS — E1169 Type 2 diabetes mellitus with other specified complication: Secondary | ICD-10-CM

## 2023-03-28 DIAGNOSIS — D219 Benign neoplasm of connective and other soft tissue, unspecified: Secondary | ICD-10-CM

## 2023-03-28 DIAGNOSIS — M898X5 Other specified disorders of bone, thigh: Secondary | ICD-10-CM | POA: Diagnosis not present

## 2023-03-28 DIAGNOSIS — M25652 Stiffness of left hip, not elsewhere classified: Secondary | ICD-10-CM | POA: Diagnosis not present

## 2023-03-28 NOTE — Patient Instructions (Signed)
Given your ongoing muscle stiffness hold (stop taking) your cholesterol medication atorvastatin 40 mg, for 1 week.  If you notice improvement in your symptoms then the cholesterol medication is causing them.  If you continue to have stiffness despite holding the medication then the medication is not causing the symptoms.  Continue all of your other medications including olmesartan-hydrochlorothiazide, spironolactone, amlodipine which are your blood pressure medications.  Also continue metformin which is for blood sugar.

## 2023-03-28 NOTE — Progress Notes (Signed)
Established Patient Office Visit   Subjective  Patient ID: Jon Moses, male    DOB: Aug 26, 1951  Age: 72 y.o. MRN: [id number]  Chief Complaint  Patient presents with   Numbness    Patient complains of numbness in left arm and leg   Hip stiffness    Patient complains of left hip stiffness    Pt is a 72 yo male with pmh sig for h/o CVA, OA of b/l hips, stiffness, HTN, HLD who is followed by Dr. Caryl Never and seen for chronic concern.  Patient endorses numbness in left upper extremity and left lower extremity status post CVA.  States that is improved some but has not completely resolved.  Was previously told by neurology that it would take a while for symptoms to improve if at all.  Patient also endorses left hip stiffness status post CVA.  States hip is stiff all the time.  Previously referred to PT but is requesting a place closer to his home.  Patient lives in [place].  Prior to CVA patient played tennis regularly.    Past Medical History:  Diagnosis Date   Diabetes mellitus without complication (HCC)    Hyperlipidemia 03/26/2011   HYPERTENSION 07/17/2010   Social History   Tobacco Use   Smoking status: Never   Smokeless tobacco: Never  Vaping Use   Vaping Use: Never used  Substance Use Topics   Alcohol use: Yes    Comment: occ   Drug use: No   Family History  Problem Relation Age of Onset   Hypertension Other    Hypertension Mother    Stroke Father    Colon cancer Neg Hx    Colon polyps Neg Hx    Esophageal cancer Neg Hx    Stomach cancer Neg Hx    Rectal cancer Neg Hx    Allergies  Allergen Reactions   Ace Inhibitors Cough      ROS Negative unless stated above    Objective:     BP 124/68 (BP Location: Left Arm, Patient Position: Sitting, Cuff Size: Normal)   Pulse 80   Temp 98 F (36.7 C) (Oral)   Ht 5\' 9"  (1.753 m)   Wt 181 lb 6.4 oz (82.3 kg)   SpO2 97%   BMI 26.79 kg/m    Physical Exam Constitutional:      General: He is not in acute  distress.    Appearance: Normal appearance.  HENT:     Head: Normocephalic and atraumatic.     Nose: Nose normal.     Mouth/Throat:     Mouth: Mucous membranes are moist.  Cardiovascular:     Rate and Rhythm: Normal rate and regular rhythm.     Heart sounds: Normal heart sounds. No murmur heard.    No gallop.  Pulmonary:     Effort: Pulmonary effort is normal. No respiratory distress.     Breath sounds: Normal breath sounds. No wheezing, rhonchi or rales.  Skin:    General: Skin is warm and dry.  Neurological:     Mental Status: He is alert and oriented to person, place, and time.     No results found for any visits on 03/28/23.    Assessment & Plan:  Left hip pain -     Ambulatory referral to Physical Therapy  Hip stiffness, left -     Ambulatory referral to Physical Therapy  Numbness  Hyperlipidemia, unspecified hyperlipidemia type  History of CVA (cerebrovascular accident)  Essential hypertension  Type 2 diabetes mellitus with other specified complication, without long-term current use of insulin (HCC)  Myxofibroma -     DG HIP UNILAT W OR W/O PELVIS 2-3 VIEWS RIGHT  Per chart review left hip stiffness chronic s/p CVA.  Discussed realistic expectations.  Advised may may not be able to get back to prior level of activity.  Obtain x-ray of right hip to follow-up on femoral neck lesion thought to be a myxofibroma seen 04/10/2022.  Also noted on x-ray from 517/2023 moderate left degenerative changes of hip.  Return if symptoms worsen or fail to improve.   Deeann Saint, MD

## 2023-03-31 ENCOUNTER — Telehealth: Payer: Self-pay | Admitting: Family Medicine

## 2023-03-31 NOTE — Telephone Encounter (Addendum)
Pt called to say he would like another PT referral to:  [facility] [address]  [place], [place]   [phone]  Pt informed MD is OOO until June 2024.

## 2023-04-02 NOTE — Telephone Encounter (Signed)
Sure

## 2023-04-10 DIAGNOSIS — M25552 Pain in left hip: Secondary | ICD-10-CM | POA: Diagnosis not present

## 2023-04-10 DIAGNOSIS — M25652 Stiffness of left hip, not elsewhere classified: Secondary | ICD-10-CM | POA: Diagnosis not present

## 2023-04-15 DIAGNOSIS — M25652 Stiffness of left hip, not elsewhere classified: Secondary | ICD-10-CM | POA: Diagnosis not present

## 2023-04-15 DIAGNOSIS — M25552 Pain in left hip: Secondary | ICD-10-CM | POA: Diagnosis not present

## 2023-04-22 DIAGNOSIS — M25652 Stiffness of left hip, not elsewhere classified: Secondary | ICD-10-CM | POA: Diagnosis not present

## 2023-04-22 DIAGNOSIS — M25552 Pain in left hip: Secondary | ICD-10-CM | POA: Diagnosis not present

## 2023-04-23 ENCOUNTER — Ambulatory Visit: Payer: Medicare HMO | Admitting: Family Medicine

## 2023-04-24 DIAGNOSIS — M25552 Pain in left hip: Secondary | ICD-10-CM | POA: Diagnosis not present

## 2023-04-24 DIAGNOSIS — M25652 Stiffness of left hip, not elsewhere classified: Secondary | ICD-10-CM | POA: Diagnosis not present

## 2023-04-30 ENCOUNTER — Ambulatory Visit: Payer: Medicare HMO | Admitting: Family Medicine

## 2023-05-01 DIAGNOSIS — M25552 Pain in left hip: Secondary | ICD-10-CM | POA: Diagnosis not present

## 2023-05-01 DIAGNOSIS — M25652 Stiffness of left hip, not elsewhere classified: Secondary | ICD-10-CM | POA: Diagnosis not present

## 2023-05-06 DIAGNOSIS — M25652 Stiffness of left hip, not elsewhere classified: Secondary | ICD-10-CM | POA: Diagnosis not present

## 2023-05-06 DIAGNOSIS — M25552 Pain in left hip: Secondary | ICD-10-CM | POA: Diagnosis not present

## 2023-05-23 ENCOUNTER — Encounter: Payer: Self-pay | Admitting: Family Medicine

## 2023-05-23 ENCOUNTER — Ambulatory Visit (INDEPENDENT_AMBULATORY_CARE_PROVIDER_SITE_OTHER): Payer: Medicare HMO | Admitting: Family Medicine

## 2023-05-23 VITALS — BP 122/70 | HR 80 | Resp 16 | Ht 69.0 in | Wt 185.5 lb

## 2023-05-23 DIAGNOSIS — R42 Dizziness and giddiness: Secondary | ICD-10-CM | POA: Diagnosis not present

## 2023-05-23 DIAGNOSIS — D649 Anemia, unspecified: Secondary | ICD-10-CM | POA: Diagnosis not present

## 2023-05-23 DIAGNOSIS — I10 Essential (primary) hypertension: Secondary | ICD-10-CM | POA: Diagnosis not present

## 2023-05-23 DIAGNOSIS — Z8673 Personal history of transient ischemic attack (TIA), and cerebral infarction without residual deficits: Secondary | ICD-10-CM | POA: Diagnosis not present

## 2023-05-23 LAB — BASIC METABOLIC PANEL
BUN: 19 mg/dL (ref 6–23)
CO2: 29 mEq/L (ref 19–32)
Calcium: 9.5 mg/dL (ref 8.4–10.5)
Chloride: 99 mEq/L (ref 96–112)
Creatinine, Ser: 1.42 mg/dL (ref 0.40–1.50)
GFR: 50.05 mL/min — ABNORMAL LOW (ref >60.00)
Glucose, Bld: 121 mg/dL — ABNORMAL HIGH (ref 70–99)
Potassium: 4.2 mEq/L (ref 3.5–5.1)
Sodium: 133 mEq/L — ABNORMAL LOW (ref 135–145)

## 2023-05-23 LAB — CBC
HCT: 29.5 % — ABNORMAL LOW (ref 39.0–52.0)
Hemoglobin: 9.6 g/dL — ABNORMAL LOW (ref 13.0–17.0)
MCHC: 32.7 g/dL (ref 30.0–36.0)
MCV: 82.8 fl (ref 78.0–100.0)
Platelets: 291 10*3/uL (ref 150.0–400.0)
RBC: 3.57 Mil/uL — ABNORMAL LOW (ref 4.22–5.81)
RDW: 14.6 % (ref 11.5–15.5)
WBC: 4 10*3/uL (ref 4.0–10.5)

## 2023-05-23 LAB — GLUCOSE, POCT (MANUAL RESULT ENTRY): POC Glucose: 134 mg/dl — AB (ref 70–99)

## 2023-05-23 NOTE — Patient Instructions (Signed)
A few things to remember from today's visit:  Lightheadedness - Plan: Basic metabolic panel, CBC  Essential hypertension - Plan: Basic metabolic panel Continue adequate hydration, avoid skipping meals, and monitor blood sugar. Keep follow appt with PCP.  Do not use My Chart to request refills or for acute issues that need immediate attention. If you send a my chart message, it may take a few days to be addressed, specially if I am not in the office.  Please be sure medication list is accurate. If a new problem present, please set up appointment sooner than planned today.

## 2023-05-23 NOTE — Progress Notes (Addendum)
ACUTE VISIT Chief Complaint  Patient presents with   Dizziness    Happened yesterday, lightheaded. Had a stroke last year.    HPI: [name] is a 72 y.o. male with PMHx significant for CVA, HTN, DM II, and HLD here today complaining of dizziness and lightheadedness that began yesterday. He reports feeling slightly better today but still experiences some lightheadedness.  He cannot described sensation, it is very mild, like fatigue. Not sure about exacerbating or alleviating factors.  Dizziness This is a new problem. The current episode started yesterday. The problem occurs intermittently. The problem has been gradually improving. Associated symptoms include fatigue. Pertinent negatives include no abdominal pain, arthralgias, change in bowel habit, chills, congestion, coughing, diaphoresis, fever, myalgias, numbness, rash, sore throat, swollen glands, urinary symptoms or weakness. Nothing aggravates the symptoms. He has tried nothing for the symptoms.   He questions whether he consumed enough food and drink yesterday, he spent most of the day outside in hot weather.  DM II on Metformin 500 mg bid. He did not check BS. Lab Results  Component Value Date   HGBA1C 6.1 (A) 10/16/2022   He denies experiencing any headache, visual changes, difficulty swallowing, speech changes, chest pain, palpitations, wheezing, nausea, vomiting, ringing in the ears, or changes in hearing.   Ischemic CVA in 10/2021 with minimal left-sided weakness. Last visit with neurologist in 06/2022. He is on Atorvastatin 40 mg daily and Aspirin 81 mg daily.  HTN on Spironolactone 25 mg daily, Hydralazine 25 mg bid,Amlodipine 10 mg daily,and Olmesartan-hydrochlorothiazide 40-12.5 mg daily.  has been monitoring their blood pressure at home, reporting readings of 132/70 yesterday and 127/71 this morning, with a pulse of 74.   Review of Systems  Constitutional:  Positive for fatigue. Negative for chills,  diaphoresis and fever.  HENT:  Negative for congestion, nosebleeds and sore throat.   Respiratory:  Negative for cough.   Gastrointestinal:  Negative for abdominal pain, blood in stool and change in bowel habit.       Negative for changes in bowel habits.  Endocrine: Negative for cold intolerance and heat intolerance.  Genitourinary:  Negative for decreased urine volume, dysuria and hematuria.  Musculoskeletal:  Negative for arthralgias and myalgias.  Skin:  Negative for rash.  Neurological:  Positive for dizziness. Negative for weakness and numbness.  Psychiatric/Behavioral:  Negative for confusion and hallucinations.   See other pertinent positives and negatives in HPI.  Current Outpatient Medications on File Prior to Visit  Medication Sig Dispense Refill   amLODipine (NORVASC) 10 MG tablet TAKE 1 TABLET BY MOUTH EVERY DAY 90 tablet 0   aspirin EC 81 MG tablet Take 81 mg by mouth daily. Swallow whole.     atorvastatin (LIPITOR) 40 MG tablet TAKE 1 TABLET BY MOUTH EVERY DAY 90 tablet 1   hydrALAZINE (APRESOLINE) 25 MG tablet TAKE 1 TAB TWICE DAILY 180 tablet 1   metFORMIN (GLUCOPHAGE) 500 MG tablet TAKE 1 TABLET BY MOUTH TWICE A DAY 180 tablet 3   olmesartan-hydrochlorothiazide (BENICAR HCT) 40-12.5 MG tablet TAKE 1 TABLET BY MOUTH EVERY DAY 90 tablet 1   spironolactone (ALDACTONE) 25 MG tablet TAKE 1 TABLET (25 MG TOTAL) BY MOUTH DAILY. 90 tablet 1   No current facility-administered medications on file prior to visit.   Past Medical History:  Diagnosis Date   Diabetes mellitus without complication (HCC)    Hyperlipidemia 03/26/2011   HYPERTENSION 07/17/2010   Allergies  Allergen Reactions   Ace Inhibitors Cough  Social History   Socioeconomic History   Marital status: Married    Spouse name: Not on file   Number of children: Not on file   Years of education: Not on file   Highest education level: Bachelor's degree (e.g., BA, AB, BS)  Occupational History   Not on file   Tobacco Use   Smoking status: Never   Smokeless tobacco: Never  Vaping Use   Vaping Use: Never used  Substance and Sexual Activity   Alcohol use: Yes    Comment: occ   Drug use: No   Sexual activity: Not on file  Other Topics Concern   Not on file  Social History Narrative   Not on file   Social Determinants of Health   Financial Resource Strain: Low Risk  (11/12/2022)   Overall Financial Resource Strain (CARDIA)    Difficulty of Paying Living Expenses: Not hard at all  Food Insecurity: No Food Insecurity (12/24/2022)   Hunger Vital Sign    Worried About Running Out of Food in the Last Year: Never true    Ran Out of Food in the Last Year: Never true  Transportation Needs: No Transportation Needs (12/24/2022)   PRAPARE - Administrator, Civil Service (Medical): No    Lack of Transportation (Non-Medical): No  Physical Activity: Sufficiently Active (11/12/2022)   Exercise Vital Sign    Days of Exercise per Week: 6 days    Minutes of Exercise per Session: 60 min  Stress: No Stress Concern Present (11/12/2022)   [employer] of Occupational Health - Occupational Stress Questionnaire    Feeling of Stress : Not at all  Social Connections: Socially Integrated (11/12/2022)   Social Connection and Isolation Panel [NHANES]    Frequency of Communication with Friends and Family: More than three times a week    Frequency of Social Gatherings with Friends and Family: More than three times a week    Attends Religious Services: More than 4 times per year    Active Member of [employer] or Organizations: Yes    Attends Banker Meetings: More than 4 times per year    Marital Status: Married    Vitals:   05/23/23 1359  BP: 122/70  Pulse: 80  Resp: 16  SpO2: 100%   Body mass index is 27.39 kg/m.  Physical Exam Vitals and nursing note reviewed.  Constitutional:      General: He is not in acute distress.    Appearance: He is well-developed.  HENT:      Head: Normocephalic and atraumatic.     Mouth/Throat:     Mouth: Mucous membranes are moist.     Pharynx: Oropharynx is clear.  Eyes:     Conjunctiva/sclera: Conjunctivae normal.  Cardiovascular:     Rate and Rhythm: Normal rate and regular rhythm.     Heart sounds: Murmur (soft SEM LUSB) heard.  Pulmonary:     Effort: Pulmonary effort is normal. No respiratory distress.     Breath sounds: Normal breath sounds.  Abdominal:     Palpations: Abdomen is soft. There is no mass.     Tenderness: There is no abdominal tenderness.  Lymphadenopathy:     Cervical: No cervical adenopathy.  Skin:    General: Skin is warm.     Findings: No erythema or rash.  Neurological:     Mental Status: He is alert and oriented to person, place, and time.     Cranial Nerves: No cranial nerve deficit.  Motor: No tremor, abnormal muscle tone or pronator drift.     Coordination: Coordination normal.     Gait: Gait normal.     Deep Tendon Reflexes:     Reflex Scores:      Bicep reflexes are 2+ on the right side and 2+ on the left side.      Patellar reflexes are 2+ on the right side and 2+ on the left side.    Comments: No significant left-sided weakness (residual from CVA 10/2021), rest no focal deficit.  Psychiatric:        Mood and Affect: Mood and affect normal.   ASSESSMENT AND PLAN:  Mr. Burker was seen today for lightheadedness. Lab Results  Component Value Date   CREATININE 1.42 05/23/2023   BUN 19 05/23/2023   NA 133 (L) 05/23/2023   K 4.2 05/23/2023   CL 99 05/23/2023   CO2 29 05/23/2023   Lab Results  Component Value Date   WBC 4.0 05/23/2023   HGB 9.6 (L) 05/23/2023   HCT 29.5 (L) 05/23/2023   MCV 82.8 05/23/2023   PLT 291.0 05/23/2023   Lightheadedness We discussed possible etiologies. Hx and examination do not suggest a serious process. Reports improvement. Finger stick 134. Stressed the importance of adequate hydration and avoiding skipping meals. I do not think head  imaging is needed at this time, he was clearly instructed about warning signs. Fall precautions. He has an appt with his PCP next week.  -     Basic metabolic panel; Future -     CBC; Future -     POCT glucose (manual entry)  Essential hypertension Resistant HTN, reporting adequate BP's at home. Continue Spironolactone 25 mg daily, Hydralazine 25 mg bid,Amlodipine 10 mg daily,and Olmesartan-hydrochlorothiazide 40-12.5 mg daily as well as low salt diet. Continue monitoring BP regularly.  -     Basic metabolic panel; Future  History of CVA (cerebrovascular accident) Continue aggressive management of risk factors. On Aspirin 81 mg daily and Atorvastatin 40 mg daily.  Labs reveled anemia.He has had mild anemia in the past, 2022. H/H  13.4/39.6. Last colonoscopy in 12/2019, I cannot see report, it shows "error. When trying to open it. Will need GI work up.  Return if symptoms worsen or fail to improve, for keep next appointment.  Charm Stenner G. [place], MD  [facility]. [place] office.

## 2023-05-26 ENCOUNTER — Telehealth: Payer: Self-pay | Admitting: Family Medicine

## 2023-05-26 NOTE — Telephone Encounter (Signed)
See result note.  

## 2023-05-26 NOTE — Telephone Encounter (Signed)
Returned call for labs

## 2023-05-27 ENCOUNTER — Telehealth: Payer: Self-pay | Admitting: Family Medicine

## 2023-05-27 ENCOUNTER — Ambulatory Visit: Payer: Medicare HMO | Admitting: Family Medicine

## 2023-05-27 NOTE — Telephone Encounter (Signed)
Pt returned call to Maralyn Sago regarding results, says he has a question for her.

## 2023-05-27 NOTE — Telephone Encounter (Signed)
I called patient, he had found what he was looking for. Nothing further needed.

## 2023-06-03 ENCOUNTER — Ambulatory Visit (INDEPENDENT_AMBULATORY_CARE_PROVIDER_SITE_OTHER): Payer: Medicare HMO | Admitting: Family Medicine

## 2023-06-03 ENCOUNTER — Encounter: Payer: Self-pay | Admitting: Family Medicine

## 2023-06-03 VITALS — BP 116/62 | HR 82 | Temp 97.7°F | Ht 69.0 in | Wt 182.0 lb

## 2023-06-03 DIAGNOSIS — I10 Essential (primary) hypertension: Secondary | ICD-10-CM

## 2023-06-03 DIAGNOSIS — E538 Deficiency of other specified B group vitamins: Secondary | ICD-10-CM

## 2023-06-03 DIAGNOSIS — D649 Anemia, unspecified: Secondary | ICD-10-CM | POA: Diagnosis not present

## 2023-06-03 DIAGNOSIS — Z7984 Long term (current) use of oral hypoglycemic drugs: Secondary | ICD-10-CM | POA: Diagnosis not present

## 2023-06-03 DIAGNOSIS — E1169 Type 2 diabetes mellitus with other specified complication: Secondary | ICD-10-CM | POA: Diagnosis not present

## 2023-06-03 LAB — CBC WITH DIFFERENTIAL/PLATELET
Basophils Absolute: 0.1 10*3/uL (ref 0.0–0.1)
Basophils Relative: 1.3 % (ref 0.0–3.0)
Eosinophils Absolute: 0.1 10*3/uL (ref 0.0–0.7)
Eosinophils Relative: 1.7 % (ref 0.0–5.0)
HCT: 31.3 % — ABNORMAL LOW (ref 39.0–52.0)
Hemoglobin: 10.2 g/dL — ABNORMAL LOW (ref 13.0–17.0)
Lymphocytes Relative: 34.3 % (ref 12.0–46.0)
Lymphs Abs: 1.6 10*3/uL (ref 0.7–4.0)
MCHC: 32.6 g/dL (ref 30.0–36.0)
MCV: 82.6 fl (ref 78.0–100.0)
Monocytes Absolute: 0.5 10*3/uL (ref 0.1–1.0)
Monocytes Relative: 10.5 % (ref 3.0–12.0)
Neutro Abs: 2.4 10*3/uL (ref 1.4–7.7)
Neutrophils Relative %: 52.2 % (ref 43.0–77.0)
Platelets: 339 10*3/uL (ref 150.0–400.0)
RBC: 3.79 Mil/uL — ABNORMAL LOW (ref 4.22–5.81)
RDW: 14.5 % (ref 11.5–15.5)
WBC: 4.7 10*3/uL (ref 4.0–10.5)

## 2023-06-03 LAB — POCT GLYCOSYLATED HEMOGLOBIN (HGB A1C): Hemoglobin A1C: 6.2 % — AB (ref 4.0–5.6)

## 2023-06-03 LAB — VITAMIN B12: Vitamin B-12: 187 pg/mL — ABNORMAL LOW (ref 211–911)

## 2023-06-03 LAB — FOLATE: Folate: 15.1 ng/mL (ref >5.9)

## 2023-06-03 NOTE — Addendum Note (Signed)
Addended by: Christy Sartorius on: 06/03/2023 02:47 PM   Modules accepted: Orders

## 2023-06-03 NOTE — Patient Instructions (Signed)
IF you don't hear back from GI within the next two weeks let me know.   Avoid Advil and Aleve.   A1C today is good at 6.2%.

## 2023-06-03 NOTE — Progress Notes (Signed)
Established Patient Office Visit  Subjective   Patient ID: Jon Moses, male    DOB: Nov 16, 1951  Age: 72 y.o. MRN: [id number]  Chief Complaint  Patient presents with   Medical Management of Chronic Issues    HPI   [name] has history of hypertension, type 2 diabetes, hyperlipidemia, history of CVA.  He was seen here by another provider on 28 June with some lightheadedness.  Further lab testing revealed hemoglobin of 9.6 with MCV of around 82.  Prior hemoglobin had been 13.4.  Patient denies any history of anemia in this range.  He had colonoscopy 2021.  Does take 81 mg aspirin secondary to history of stroke.  He has hypertension which is controlled with Aldactone, amlodipine, hydralazine, and olmesartan HCTZ.  Denies any dizziness today.  No recent loss of consciousness.  No abdominal pain.  No nausea or vomiting.  Denies any recent melena.  He states his appetite is fair and weight is stable.  Diabetes has been controlled.  He takes metformin.  No polyuria or polydipsia.  Past Medical History:  Diagnosis Date   Diabetes mellitus without complication (HCC)    Hyperlipidemia 03/26/2011   HYPERTENSION 07/17/2010   Past Surgical History:  Procedure Laterality Date   COLONOSCOPY     11 yrs ago from 2021   INNER EAR SURGERY  1975   left   LIPOMA EXCISION Left 12/24/2013   Procedure: EXCISION MASS LEFT BACK;  Surgeon: Liz Malady, MD;  Location: [facility];  Service: General;  Laterality: Left;   TONSILLECTOMY      reports that he has never smoked. He has never used smokeless tobacco. He reports current alcohol use. He reports that he does not use drugs. family history includes Hypertension in his mother and another family member; Stroke in his father. Allergies  Allergen Reactions   Ace Inhibitors Cough    Review of Systems  Constitutional:  Negative for chills, fever and malaise/fatigue.  Eyes:  Negative for blurred vision.  Respiratory:  Negative for  shortness of breath.   Cardiovascular:  Negative for chest pain.  Neurological:  Negative for weakness and headaches.      Objective:     BP 116/62 (BP Location: Left Arm, Patient Position: Sitting, Cuff Size: Normal)   Pulse 82   Temp 97.7 F (36.5 C) (Oral)   Ht 5\' 9"  (1.753 m)   Wt 182 lb (82.6 kg)   SpO2 97%   BMI 26.88 kg/m  BP Readings from Last 3 Encounters:  06/03/23 116/62  05/23/23 122/70  03/28/23 124/68   Wt Readings from Last 3 Encounters:  06/03/23 182 lb (82.6 kg)  05/23/23 185 lb 8 oz (84.1 kg)  03/28/23 181 lb 6.4 oz (82.3 kg)      Physical Exam Vitals reviewed.  Constitutional:      General: He is not in acute distress.    Appearance: Normal appearance. He is not ill-appearing.  Cardiovascular:     Rate and Rhythm: Normal rate and regular rhythm.  Pulmonary:     Effort: Pulmonary effort is normal.     Breath sounds: Normal breath sounds.  Abdominal:     General: There is no distension.     Palpations: Abdomen is soft. There is no mass.     Tenderness: There is no abdominal tenderness. There is no guarding or rebound.  Musculoskeletal:     Right lower leg: No edema.     Left lower leg: No edema.  Neurological:     Mental Status: He is alert.      Results for orders placed or performed in visit on 06/03/23  POC HgB A1c  Result Value Ref Range   Hemoglobin A1C 6.2 (A) 4.0 - 5.6 %   HbA1c POC (<> result, manual entry)     HbA1c, POC (prediabetic range)     HbA1c, POC (controlled diabetic range)      Last CBC Lab Results  Component Value Date   WBC 4.0 05/23/2023   HGB 9.6 (L) 05/23/2023   HCT 29.5 (L) 05/23/2023   MCV 82.8 05/23/2023   RDW 14.6 05/23/2023   PLT 291.0 05/23/2023   Last metabolic panel Lab Results  Component Value Date   GLUCOSE 121 (H) 05/23/2023   NA 133 (L) 05/23/2023   K 4.2 05/23/2023   CL 99 05/23/2023   CO2 29 05/23/2023   BUN 19 05/23/2023   CREATININE 1.42 05/23/2023   GFR 50.05 (L) 05/23/2023    CALCIUM 9.5 05/23/2023   PROT 7.6 12/25/2022   ALBUMIN 4.5 12/25/2022   BILITOT 0.4 12/25/2022   ALKPHOS 51 12/25/2022   AST 16 12/25/2022   ALT 20 12/25/2022   Last lipids Lab Results  Component Value Date   CHOL 123 12/25/2022   HDL 45.50 12/25/2022   LDLCALC 54 12/25/2022   LDLDIRECT 175.2 10/11/2013   TRIG 120.0 12/25/2022   CHOLHDL 3 12/25/2022   Last hemoglobin A1c Lab Results  Component Value Date   HGBA1C 6.2 (A) 06/03/2023      The ASCVD Risk score (Arnett DK, et al., 2019) failed to calculate for the following reasons:   The patient has a prior MI or stroke diagnosis    Assessment & Plan:   #1 type 2 diabetes well-controlled with A1c today 6.2%.  Continue metformin.  Continue yearly diabetic eye exam.  Continue yearly urine microalbumin screen.  #2 hypertension stable and well-controlled on multidrug regimen as above.  Continue low-sodium diet  #3 anemia by recent labs with hemoglobin 9.6.  Check further labs today with ferritin, serum iron, B12, folate.  Patient has already been referred to GI and is still waiting to hear back.  He does take aspirin secondary to stroke history.  Avoid any other nonsteroidals such as Aleve or Advil.  Follow-up immediately for any increased dizziness, melena, hematemesis, or bloody stools.  He does not show any orthostatic changes today.  Seated blood pressure 110/60 and standing 118/68   Return in about 3 months (around 09/03/2023).    Evelena Peat, MD

## 2023-06-04 ENCOUNTER — Telehealth: Payer: Self-pay | Admitting: Family Medicine

## 2023-06-04 LAB — IRON,TIBC AND FERRITIN PANEL
%SAT: 9 % (calc) — ABNORMAL LOW (ref 20–48)
Ferritin: 4 ng/mL — ABNORMAL LOW (ref 24–380)
Iron: 36 ug/dL — ABNORMAL LOW (ref 50–180)
TIBC: 399 mcg/dL (calc) (ref 250–425)

## 2023-06-04 NOTE — Telephone Encounter (Signed)
Pt called, returning CMA's call. CMA was with a patient. Pt asked that CMA call back at his earliest convenience. 

## 2023-06-05 ENCOUNTER — Ambulatory Visit: Payer: Medicare HMO

## 2023-06-05 ENCOUNTER — Ambulatory Visit (INDEPENDENT_AMBULATORY_CARE_PROVIDER_SITE_OTHER): Payer: Medicare HMO

## 2023-06-05 ENCOUNTER — Telehealth: Payer: Self-pay | Admitting: Family Medicine

## 2023-06-05 DIAGNOSIS — E538 Deficiency of other specified B group vitamins: Secondary | ICD-10-CM | POA: Diagnosis not present

## 2023-06-05 MED ORDER — CYANOCOBALAMIN 1000 MCG/ML IJ SOLN
1000.0000 ug | Freq: Once | INTRAMUSCULAR | Status: AC
Start: 2023-06-05 — End: 2023-06-05
  Administered 2023-06-05: 1000 ug via INTRAMUSCULAR

## 2023-06-05 NOTE — Telephone Encounter (Signed)
Pt called at 10:45 am to say he would not be able to come in for his 10:45 am nurse visit. Pt asked if we could make him an appointment for today in [place]. Pt was told we could make him an appointment here, this afternoon Pt began yelling and saying he is 50 miles away and he cannot keep driving 161 miles every time we make him an appointment Pt was very loud, abusive and intimidating Pt was told several times that if he continued yelling at me, I would have to disconnect the call. Pt continued to yell, told me to shut up and listen and continued yelling, insisting we make him an appointment in [place]!! Pt was told to have a blessed day. Call was disconnected.

## 2023-06-05 NOTE — Progress Notes (Signed)
Per orders of Dr. Hernandez, injection of Cyanocobalamin 1000 mcg given by Alondra Sahni L Stillman Buenger. Patient tolerated injection well.  

## 2023-06-05 NOTE — Addendum Note (Signed)
Addended by: Christy Sartorius on: 06/05/2023 08:47 AM   Modules accepted: Orders

## 2023-06-05 NOTE — Telephone Encounter (Signed)
Please see result note 

## 2023-06-09 NOTE — Telephone Encounter (Signed)
Patient informed of the message below and voiced understanding. Patient reported he would continue with IM replacement at our office

## 2023-06-09 NOTE — Telephone Encounter (Signed)
 Left message for patient to return my call.

## 2023-06-12 ENCOUNTER — Ambulatory Visit: Payer: Medicare HMO

## 2023-06-12 NOTE — Telephone Encounter (Addendum)
Pt is calling and unable to come in today for his B12 injection and would like mykal to return his call also pt would like to know if [facility]. Pt is aware md not in office today

## 2023-06-13 ENCOUNTER — Ambulatory Visit (INDEPENDENT_AMBULATORY_CARE_PROVIDER_SITE_OTHER): Payer: Medicare HMO

## 2023-06-13 DIAGNOSIS — E538 Deficiency of other specified B group vitamins: Secondary | ICD-10-CM

## 2023-06-13 MED ORDER — CYANOCOBALAMIN 1000 MCG/ML IJ SOLN
1000.0000 ug | Freq: Once | INTRAMUSCULAR | Status: AC
Start: 2023-06-13 — End: 2023-06-13
  Administered 2023-06-13: 1000 ug via INTRAMUSCULAR

## 2023-06-13 NOTE — Progress Notes (Signed)
Per orders of Dr. Caryl Never, injection of Cyanocobalamin Inj. 1000 mcg given by Vickii Chafe on Right Deltoid.  Patient tolerated injection well.

## 2023-06-16 NOTE — Telephone Encounter (Signed)
Left a message for the patient to return my call.  

## 2023-06-16 NOTE — Telephone Encounter (Signed)
Patient informed of the message below and voiced understanding  

## 2023-06-20 ENCOUNTER — Ambulatory Visit: Payer: Medicare HMO

## 2023-06-23 ENCOUNTER — Other Ambulatory Visit: Payer: Self-pay | Admitting: Family Medicine

## 2023-06-27 ENCOUNTER — Ambulatory Visit (INDEPENDENT_AMBULATORY_CARE_PROVIDER_SITE_OTHER): Payer: Medicare HMO

## 2023-06-27 ENCOUNTER — Telehealth: Payer: Self-pay | Admitting: Family Medicine

## 2023-06-27 ENCOUNTER — Ambulatory Visit: Payer: Medicare HMO

## 2023-06-27 DIAGNOSIS — E538 Deficiency of other specified B group vitamins: Secondary | ICD-10-CM | POA: Diagnosis not present

## 2023-06-27 DIAGNOSIS — E1165 Type 2 diabetes mellitus with hyperglycemia: Secondary | ICD-10-CM

## 2023-06-27 MED ORDER — CYANOCOBALAMIN 1000 MCG/ML IJ SOLN
1000.0000 ug | Freq: Once | INTRAMUSCULAR | Status: AC
Start: 2023-06-27 — End: 2023-06-27
  Administered 2023-06-27: 1000 ug via INTRAMUSCULAR

## 2023-06-27 NOTE — Telephone Encounter (Signed)
Left a message for the patient to return my call.  

## 2023-06-27 NOTE — Telephone Encounter (Signed)
Patient informed he is due for 1 remaining B012 injection and appt has been scheduled

## 2023-06-27 NOTE — Progress Notes (Signed)
Per orders of Kristian Covey, MD, injection of B12 given in Left deltoid by Sherrin Daisy. Patient tolerated injection well.  Lab Results  Component Value Date   VITAMINB12 187 (L) 06/03/2023

## 2023-06-27 NOTE — Patient Instructions (Signed)
Health Maintenance Due  Topic Date Due   FOOT EXAM  Never done   Zoster Vaccines- Shingrix (1 of 2) Never done   COVID-19 Vaccine (3 - Moderna risk series) 03/13/2020   OPHTHALMOLOGY EXAM  06/05/2022   INFLUENZA VACCINE  06/26/2023       12/24/2022    9:37 AM 11/12/2022   11:22 AM 01/22/2022    5:03 PM  Depression screen PHQ 2/9  Decreased Interest 0 0 0  Down, Depressed, Hopeless 0 0 0  PHQ - 2 Score 0 0 0  Altered sleeping   2  Tired, decreased energy   1  Change in appetite   0  Feeling bad or failure about yourself    0  Trouble concentrating   0  Moving slowly or fidgety/restless   0  Suicidal thoughts   0  PHQ-9 Score   3

## 2023-06-27 NOTE — Telephone Encounter (Signed)
Pt had b12 injection today and would like to know how many more does he need?

## 2023-07-04 ENCOUNTER — Telehealth: Payer: Self-pay | Admitting: Family Medicine

## 2023-07-04 ENCOUNTER — Ambulatory Visit: Payer: Medicare HMO

## 2023-07-04 NOTE — Telephone Encounter (Signed)
Pt called requesting to speak to CMA.   Pt was asked if it was regarding a medication refill, as I could help with that. Pt stated it was for something else and would rather speak directly to CMA.

## 2023-07-07 NOTE — Telephone Encounter (Signed)
I called the patient and he stated he spoke with someone earlier as he was calling to verify an upcoming appt with PCP.  Patient was advised in the future to feel free to leave a message with the front staff in order for info to be sent to PCP for assistance.

## 2023-07-15 ENCOUNTER — Ambulatory Visit (INDEPENDENT_AMBULATORY_CARE_PROVIDER_SITE_OTHER): Payer: Medicare HMO

## 2023-07-15 ENCOUNTER — Telehealth: Payer: Self-pay | Admitting: Family Medicine

## 2023-07-15 DIAGNOSIS — E538 Deficiency of other specified B group vitamins: Secondary | ICD-10-CM | POA: Diagnosis not present

## 2023-07-15 MED ORDER — CYANOCOBALAMIN 1000 MCG/ML IJ SOLN
1000.0000 ug | Freq: Once | INTRAMUSCULAR | Status: AC
Start: 2023-07-15 — End: 2023-07-15
  Administered 2023-07-15: 1000 ug via INTRAMUSCULAR

## 2023-07-15 NOTE — Progress Notes (Signed)
Per orders of Dr. Burchette, injection of Cyanocobalamin 1000 mcg given by Mykal L Good. Patient tolerated injection well.  

## 2023-07-15 NOTE — Telephone Encounter (Signed)
Pt is calling and missed his last b12 appt and would like a callback and also would like to know if he can get b12 in [place]

## 2023-07-15 NOTE — Telephone Encounter (Signed)
I spoke with the patient and he stated he would scheduled remaining B-12 injection in next couple of days at our clinic.

## 2023-07-18 ENCOUNTER — Other Ambulatory Visit: Payer: Self-pay | Admitting: Family Medicine

## 2023-08-07 ENCOUNTER — Other Ambulatory Visit: Payer: Self-pay | Admitting: Family Medicine

## 2023-08-07 DIAGNOSIS — I10 Essential (primary) hypertension: Secondary | ICD-10-CM

## 2023-08-08 DIAGNOSIS — H6123 Impacted cerumen, bilateral: Secondary | ICD-10-CM | POA: Diagnosis not present

## 2023-08-08 DIAGNOSIS — H7192 Unspecified cholesteatoma, left ear: Secondary | ICD-10-CM | POA: Diagnosis not present

## 2023-09-03 ENCOUNTER — Ambulatory Visit: Payer: Medicare HMO | Admitting: Family Medicine

## 2023-09-05 ENCOUNTER — Other Ambulatory Visit (INDEPENDENT_AMBULATORY_CARE_PROVIDER_SITE_OTHER): Payer: Medicare HMO

## 2023-09-05 ENCOUNTER — Other Ambulatory Visit: Payer: Medicare HMO

## 2023-09-05 DIAGNOSIS — E538 Deficiency of other specified B group vitamins: Secondary | ICD-10-CM

## 2023-09-05 NOTE — Addendum Note (Signed)
Addended by: Clearnce Sorrel on: 09/05/2023 03:17 PM   Modules accepted: Orders

## 2023-09-06 LAB — VITAMIN B12: Vitamin B-12: 722 pg/mL (ref 200–1100)

## 2023-09-10 DIAGNOSIS — Z136 Encounter for screening for cardiovascular disorders: Secondary | ICD-10-CM | POA: Diagnosis not present

## 2023-09-10 DIAGNOSIS — I6350 Cerebral infarction due to unspecified occlusion or stenosis of unspecified cerebral artery: Secondary | ICD-10-CM | POA: Diagnosis not present

## 2023-09-29 ENCOUNTER — Telehealth: Payer: Self-pay | Admitting: Family Medicine

## 2023-09-29 NOTE — Telephone Encounter (Signed)
Pt has been having light-headedness since Sunday afternoon.  Patient tried to talk to nurse triage but line keeps cutting off on patient and on myself when I tried to call as well.  Please give patient a call back.

## 2023-09-29 NOTE — Telephone Encounter (Signed)
I spoke with the patient and he reported lightheadedness x2 days. Patient reported he checked his BP with readings of 117/70 today. Patient stated he has not headache or sudden vision changes. Patient has been scheduled to see PCP 09/30/2023.

## 2023-09-30 ENCOUNTER — Ambulatory Visit (INDEPENDENT_AMBULATORY_CARE_PROVIDER_SITE_OTHER): Payer: Medicare HMO | Admitting: Family Medicine

## 2023-09-30 VITALS — BP 122/78 | HR 75 | Temp 97.9°F | Ht 69.0 in | Wt 188.9 lb

## 2023-09-30 DIAGNOSIS — E538 Deficiency of other specified B group vitamins: Secondary | ICD-10-CM | POA: Diagnosis not present

## 2023-09-30 DIAGNOSIS — D509 Iron deficiency anemia, unspecified: Secondary | ICD-10-CM

## 2023-09-30 DIAGNOSIS — R42 Dizziness and giddiness: Secondary | ICD-10-CM | POA: Diagnosis not present

## 2023-09-30 NOTE — Telephone Encounter (Signed)
Noted  

## 2023-09-30 NOTE — Progress Notes (Signed)
Established Patient Office Visit  Subjective   Patient ID: Jon Moses, male    DOB: 1951/06/20  Age: 72 y.o. MRN: [id number]  Chief Complaint  Patient presents with   Dizziness    Patient complains of dizziness, x3 days     HPI   Sye has history of hypertension, type 2 diabetes, history of CVA, hyperlipidemia.  He had called couple days ago with some lightheadedness.  This occurred a couple days ago.  No syncope.  No palpitations.  No chest pains.  No recent nausea, vomiting, or diarrhea.  He feels like he has been drinking fluids as usual.  Symptoms are actually improved today.  He recalls blood pressure around 117 systolic.  He is not have any consistent positional dizziness today.  His regular medications include amlodipine, atorvastatin, Dralzine, Benicar HCTZ, Aldactone, and metformin along with aspirin.  He does have history of B12 deficiency.  Had recent follow-up B12 level which went from 187-722.  He is currently taking oral daily supplement. Last summer he had evidence for iron deficiency with iron level of 36, ferritin 4 and iron saturation 9%.  Hemoglobin 10.2.  He had colonoscopy back in 2021.  Denies any recent bloody stools or melena.  No abdominal pain.  No known history of ulcer.  We made referral to GI provider back in the summer but not clear he ever went.  Past Medical History:  Diagnosis Date   Diabetes mellitus without complication (HCC)    Hyperlipidemia 03/26/2011   HYPERTENSION 07/17/2010   Past Surgical History:  Procedure Laterality Date   COLONOSCOPY     11 yrs ago from 2021   INNER EAR SURGERY  1975   left   LIPOMA EXCISION Left 12/24/2013   Procedure: EXCISION MASS LEFT BACK;  Surgeon: Liz Malady, MD;  Location: [facility];  Service: General;  Laterality: Left;   TONSILLECTOMY      reports that he has never smoked. He has never used smokeless tobacco. He reports current alcohol use. He reports that he does not use  drugs. family history includes Hypertension in his mother and another family member; Stroke in his father. Allergies  Allergen Reactions   Ace Inhibitors Cough    Review of Systems  Constitutional:  Negative for malaise/fatigue and weight loss.  Eyes:  Negative for blurred vision.  Respiratory:  Negative for shortness of breath.   Cardiovascular:  Negative for chest pain.  Gastrointestinal:  Negative for abdominal pain, blood in stool and melena.  Neurological:  Positive for dizziness. Negative for focal weakness, weakness and headaches.      Objective:     BP 122/78 (BP Location: Left Arm, Cuff Size: Normal)   Pulse 75   Temp 97.9 F (36.6 C) (Oral)   Ht 5\' 9"  (1.753 m)   Wt 188 lb 14.4 oz (85.7 kg)   SpO2 100%   BMI 27.90 kg/m  BP Readings from Last 3 Encounters:  09/30/23 122/78  06/03/23 116/62  05/23/23 122/70   Wt Readings from Last 3 Encounters:  09/30/23 188 lb 14.4 oz (85.7 kg)  06/03/23 182 lb (82.6 kg)  05/23/23 185 lb 8 oz (84.1 kg)      Physical Exam Vitals reviewed.  Constitutional:      General: He is not in acute distress.    Appearance: Normal appearance.  Cardiovascular:     Rate and Rhythm: Normal rate and regular rhythm.  Pulmonary:     Effort: Pulmonary effort is  normal.     Breath sounds: Normal breath sounds.  Abdominal:     Palpations: Abdomen is soft.     Tenderness: There is no abdominal tenderness.  Musculoskeletal:     Right lower leg: No edema.     Left lower leg: No edema.  Neurological:     General: No focal deficit present.     Mental Status: He is alert.     Cranial Nerves: No cranial nerve deficit.      No results found for any visits on 09/30/23.  Last CBC Lab Results  Component Value Date   WBC 4.7 06/03/2023   HGB 10.2 (L) 06/03/2023   HCT 31.3 (L) 06/03/2023   MCV 82.6 06/03/2023   RDW 14.5 06/03/2023   PLT 339.0 06/03/2023   Last metabolic panel Lab Results  Component Value Date   GLUCOSE 121 (H)  05/23/2023   NA 133 (L) 05/23/2023   K 4.2 05/23/2023   CL 99 05/23/2023   CO2 29 05/23/2023   BUN 19 05/23/2023   CREATININE 1.42 05/23/2023   GFR 50.05 (L) 05/23/2023   CALCIUM 9.5 05/23/2023   PROT 7.6 12/25/2022   ALBUMIN 4.5 12/25/2022   BILITOT 0.4 12/25/2022   ALKPHOS 51 12/25/2022   AST 16 12/25/2022   ALT 20 12/25/2022   Last lipids Lab Results  Component Value Date   CHOL 123 12/25/2022   HDL 45.50 12/25/2022   LDLCALC 54 12/25/2022   LDLDIRECT 175.2 10/11/2013   TRIG 120.0 12/25/2022   CHOLHDL 3 12/25/2022   Last hemoglobin A1c Lab Results  Component Value Date   HGBA1C 6.2 (A) 06/03/2023   Last vitamin B12 and Folate Lab Results  Component Value Date   VITAMINB12 722 09/05/2023   FOLATE 15.1 06/03/2023      The ASCVD Risk score (Arnett DK, et al., 2019) failed to calculate for the following reasons:   The patient has a prior MI or stroke diagnosis    Assessment & Plan:   #1 recent lightheadedness.  Blood pressure today seated 115/78 and standing 122/78.  Asymptomatic at this time.  May have been slightly volume contracted.  Has excellent blood pressures today and we have not recommended making any change in his blood pressure medication.  Stressed importance of staying well-hydrated  #2 B12 deficiency-now corrected.  Remain on oral over-the-counter B12 1000 mcg daily  #3 iron deficiency anemia.  Patient was referred to GI but he states he never went.  Recommend repeat CBC along with iron studies.  Needs GI follow-up if iron not improved   No follow-ups on file.    Evelena Peat, MD

## 2023-09-30 NOTE — Patient Instructions (Signed)
Set up labs for this Thursday  Stay well hydrated  Continue to monitor BP and let me know if you have any recurrent dizziness.

## 2023-10-29 ENCOUNTER — Encounter: Payer: Self-pay | Admitting: Family Medicine

## 2023-10-29 NOTE — Telephone Encounter (Signed)
 Care team updated and letter sent for eye exam notes.

## 2023-11-07 ENCOUNTER — Other Ambulatory Visit: Payer: Self-pay | Admitting: Family Medicine

## 2023-11-07 DIAGNOSIS — I10 Essential (primary) hypertension: Secondary | ICD-10-CM

## 2023-11-17 ENCOUNTER — Ambulatory Visit: Payer: Medicare HMO

## 2023-11-17 VITALS — Ht 69.0 in | Wt 188.0 lb

## 2023-11-17 DIAGNOSIS — Z0000 Encounter for general adult medical examination without abnormal findings: Secondary | ICD-10-CM | POA: Diagnosis not present

## 2023-11-17 NOTE — Patient Instructions (Addendum)
Jon Moses , Thank you for taking time to come for your Medicare Wellness Visit. I appreciate your ongoing commitment to your health goals. Please review the following plan we discussed and let me know if I can assist you in the future.   Referrals/Orders/Follow-Ups/Clinician Recommendations:   This is a list of the screening recommended for you and due dates:  Health Maintenance  Topic Date Due   Complete foot exam   Never done   Zoster (Shingles) Vaccine (1 of 2) Never done   COVID-19 Vaccine (3 - Moderna risk series) 03/13/2020   Eye exam for diabetics  06/05/2022   Flu Shot  06/26/2023   Hemoglobin A1C  12/04/2023   Yearly kidney health urinalysis for diabetes  12/26/2023   Yearly kidney function blood test for diabetes  05/22/2024   Medicare Annual Wellness Visit  11/16/2024   DTaP/Tdap/Td vaccine (3 - Td or Tdap) 02/06/2026   Colon Cancer Screening  01/10/2027   Pneumonia Vaccine  Completed   Hepatitis C Screening  Completed   HPV Vaccine  Aged Out    Advanced directives: (Declined) Advance directive discussed with you today. Even though you declined this today, please call our office should you change your mind, and we can give you the proper paperwork for you to fill out.  Next Medicare Annual Wellness Visit scheduled for next year: Yes

## 2023-11-17 NOTE — Progress Notes (Signed)
Subjective:   Jon Moses is a 72 y.o. male who presents for Medicare Annual/Subsequent preventive examination.  Visit Complete: Virtual I connected with  Teola Bradley on 11/17/23 by a audio enabled telemedicine application and verified that I am speaking with the correct person using two identifiers.  Patient Location: Home  Provider Location: Home Office  I discussed the limitations of evaluation and management by telemedicine. The patient expressed understanding and agreed to proceed.  Vital Signs: Because this visit was a virtual/telehealth visit, some criteria may be missing or patient reported. Any vitals not documented were not able to be obtained and vitals that have been documented are patient reported.  Patient Medicare AWV questionnaire was completed by the patient on 11/16/23; I have confirmed that all information answered by patient is correct and no changes since this date.        Objective:    Today's Vitals   11/17/23 1448  Weight: 188 lb (85.3 kg)  Height: 5\' 9"  (1.753 m)   Body mass index is 27.76 kg/m.     11/17/2023    2:55 PM 11/12/2022   11:24 AM 09/28/2021   11:23 AM 09/22/2020   11:29 AM 09/22/2020   11:19 AM 12/21/2013    2:19 PM  Advanced Directives  Does Patient Have a Medical Advance Directive? No No No No No Patient does not have advance directive;Patient would not like information  Would patient like information on creating a medical advance directive? No - Patient declined No - Patient declined No - Patient declined No - Patient declined No - Patient declined     Current Medications (verified) Outpatient Encounter Medications as of 11/17/2023  Medication Sig   amLODipine (NORVASC) 10 MG tablet TAKE 1 TABLET BY MOUTH EVERY DAY   aspirin EC 81 MG tablet Take 81 mg by mouth daily. Swallow whole.   atorvastatin (LIPITOR) 40 MG tablet TAKE 1 TABLET BY MOUTH EVERY DAY   hydrALAZINE (APRESOLINE) 25 MG tablet TAKE 1 TABLET BY MOUTH TWICE A  DAY   metFORMIN (GLUCOPHAGE) 500 MG tablet TAKE 1 TABLET BY MOUTH TWICE A DAY   olmesartan-hydrochlorothiazide (BENICAR HCT) 40-12.5 MG tablet TAKE 1 TABLET BY MOUTH EVERY DAY   spironolactone (ALDACTONE) 25 MG tablet TAKE 1 TABLET (25 MG TOTAL) BY MOUTH DAILY.   No facility-administered encounter medications on file as of 11/17/2023.    Allergies (verified) Ace inhibitors   History: Past Medical History:  Diagnosis Date   Diabetes mellitus without complication (HCC)    Hyperlipidemia 03/26/2011   HYPERTENSION 07/17/2010   Past Surgical History:  Procedure Laterality Date   COLONOSCOPY     11 yrs ago from 2021   INNER EAR SURGERY  1975   left   LIPOMA EXCISION Left 12/24/2013   Procedure: EXCISION MASS LEFT BACK;  Surgeon: Liz Malady, MD;  Location: [facility];  Service: General;  Laterality: Left;   TONSILLECTOMY     Family History  Problem Relation Age of Onset   Hypertension Other    Hypertension Mother    Stroke Father    Colon cancer Neg Hx    Colon polyps Neg Hx    Esophageal cancer Neg Hx    Stomach cancer Neg Hx    Rectal cancer Neg Hx    Social History   Socioeconomic History   Marital status: Married    Spouse name: Not on file   Number of children: Not on file   Years of education:  Not on file   Highest education level: Bachelor's degree (e.g., BA, AB, BS)  Occupational History   Not on file  Tobacco Use   Smoking status: Never   Smokeless tobacco: Never  Vaping Use   Vaping status: Never Used  Substance and Sexual Activity   Alcohol use: Yes    Comment: occ   Drug use: No   Sexual activity: Not on file  Other Topics Concern   Not on file  Social History Narrative   Not on file   Social Drivers of Health   Financial Resource Strain: Low Risk  (11/17/2023)   Overall Financial Resource Strain (CARDIA)    Difficulty of Paying Living Expenses: Not hard at all  Food Insecurity: No Food Insecurity (11/17/2023)   Hunger Vital  Sign    Worried About Running Out of Food in the Last Year: Never true    Ran Out of Food in the Last Year: Never true  Transportation Needs: No Transportation Needs (11/17/2023)   PRAPARE - Administrator, Civil Service (Medical): No    Lack of Transportation (Non-Medical): No  Physical Activity: Sufficiently Active (11/17/2023)   Exercise Vital Sign    Days of Exercise per Week: 5 days    Minutes of Exercise per Session: 100 min  Recent Concern: Physical Activity - Insufficiently Active (09/29/2023)   Exercise Vital Sign    Days of Exercise per Week: 2 days    Minutes of Exercise per Session: 20 min  Stress: No Stress Concern Present (11/17/2023)   [employer] of Occupational Health - Occupational Stress Questionnaire    Feeling of Stress : Not at all  Social Connections: Socially Integrated (11/17/2023)   Social Connection and Isolation Panel [NHANES]    Frequency of Communication with Friends and Family: More than three times a week    Frequency of Social Gatherings with Friends and Family: More than three times a week    Attends Religious Services: More than 4 times per year    Active Member of [employer] or Organizations: Yes    Attends Engineer, structural: More than 4 times per year    Marital Status: Married    Tobacco Counseling Counseling given: Not Answered   Clinical Intake:  Pre-visit preparation completed: Yes  Pain : No/denies pain     BMI - recorded: 27.76 Nutritional Risks: None Diabetes: No  How often do you need to have someone help you when you read instructions, pamphlets, or other written materials from your doctor or pharmacy?: 1 - Never  Interpreter Needed?: No  Information entered by :: Theresa Mulligan LPN   Activities of Daily Living    11/16/2023   11:41 AM  In your present state of health, do you have any difficulty performing the following activities:  Hearing? 0  Vision? 0  Difficulty concentrating or making  decisions? 0  Walking or climbing stairs? 0  Dressing or bathing? 0  Doing errands, shopping? 0  Preparing Food and eating ? N  Using the Toilet? N  In the past six months, have you accidently leaked urine? N  Do you have problems with loss of bowel control? N  Managing your Medications? N  Managing your Finances? N  Housekeeping or managing your Housekeeping? N    Patient Care Team: Kristian Covey, MD as PCP - [facility], [facility])  Indicate any recent Medical Services you may have received from other than [facility] providers in the past year (date  may be approximate).     Assessment:   This is a routine wellness examination for Guy.  Hearing/Vision screen Hearing Screening - Comments:: Denies hearing difficulties   Vision Screening - Comments:: Wears reading  glasses - up to date with routine eye exams with  Deferred   Goals Addressed               This Visit's Progress     Increase physical activity (pt-stated)         Depression Screen    11/17/2023    2:54 PM 12/24/2022    9:37 AM 11/12/2022   11:22 AM 01/22/2022    5:03 PM 09/28/2021   11:24 AM 09/28/2021   11:21 AM 09/22/2020   11:30 AM  PHQ 2/9 Scores  PHQ - 2 Score 0 0 0 0 0 0 0  PHQ- 9 Score    3   0    Fall Risk    11/17/2023    2:55 PM 11/16/2023   11:41 AM 08/31/2023    8:38 PM 05/23/2023    2:06 PM 11/12/2022   11:23 AM  Fall Risk   Falls in the past year? 0 0 0 0 0  Number falls in past yr: 0 0  0 0  Injury with Fall? 0 0  0 0  Risk for fall due to : No Fall Risks   Other (Comment) No Fall Risks  Follow up Falls prevention discussed   Falls evaluation completed Falls prevention discussed    MEDICARE RISK AT HOME: Medicare Risk at Home Any stairs in or around the home?: (Patient-Rptd) Yes If so, are there any without handrails?: (Patient-Rptd) Yes Home free of loose throw rugs in walkways, pet beds, electrical cords, etc?: (Patient-Rptd) No Adequate lighting in your home  to reduce risk of falls?: (Patient-Rptd) Yes Life alert?: (Patient-Rptd) No Use of a cane, walker or w/c?: (Patient-Rptd) No Grab bars in the bathroom?: (Patient-Rptd) No Shower chair or bench in shower?: (Patient-Rptd) No Elevated toilet seat or a handicapped toilet?: (Patient-Rptd) No  TIMED UP AND GO:  Was the test performed?  No    Cognitive Function:        11/17/2023    2:56 PM 11/12/2022   11:24 AM  6CIT Screen  What Year? 0 points 0 points  What month? 0 points 0 points  What time? 0 points 0 points  Count back from 20 0 points 0 points  Months in reverse 0 points 0 points  Repeat phrase 0 points 0 points  Total Score 0 points 0 points    Immunizations Immunization History  Administered Date(s) Administered   Fluad Quad(high Dose 65+) 08/08/2021   Influenza Split 10/29/2011, 09/16/2012   Influenza Whole 08/07/2010   Influenza, High Dose Seasonal PF 01/01/2019   Influenza,inj,Quad PF,6+ Mos 10/14/2013, 08/27/2016   Moderna Sars-Covid-2 Vaccination 01/17/2020, 02/14/2020   Pneumococcal Conjugate-13 07/01/2018   Pneumococcal Polysaccharide-23 08/08/2021   Td 11/26/2003   Tdap 02/07/2016    TDAP status: Up to date  Flu Vaccine status: Due, Education has been provided regarding the importance of this vaccine. Advised may receive this vaccine at local pharmacy or Health Dept. Aware to provide a copy of the vaccination record if obtained from local pharmacy or Health Dept. Verbalized acceptance and understanding.  Pneumococcal vaccine status: Up to date  Covid-19 vaccine status: Declined, Education has been provided regarding the importance of this vaccine but patient still declined. Advised may receive this vaccine at local pharmacy or  Health Dept.or vaccine clinic. Aware to provide a copy of the vaccination record if obtained from local pharmacy or Health Dept. Verbalized acceptance and understanding.  Qualifies for Shingles Vaccine? Yes   Zostavax completed No    Shingrix Completed?: No.    Education has been provided regarding the importance of this vaccine. Patient has been advised to call insurance company to determine out of pocket expense if they have not yet received this vaccine. Advised may also receive vaccine at local pharmacy or Health Dept. Verbalized acceptance and understanding.  Screening Tests Health Maintenance  Topic Date Due   FOOT EXAM  Never done   Zoster Vaccines- Shingrix (1 of 2) Never done   COVID-19 Vaccine (3 - Moderna risk series) 03/13/2020   OPHTHALMOLOGY EXAM  06/05/2022   INFLUENZA VACCINE  06/26/2023   HEMOGLOBIN A1C  12/04/2023   Diabetic kidney evaluation - Urine ACR  12/26/2023   Diabetic kidney evaluation - eGFR measurement  05/22/2024   Medicare Annual Wellness (AWV)  11/16/2024   DTaP/Tdap/Td (3 - Td or Tdap) 02/06/2026   Colonoscopy  01/10/2027   Pneumonia Vaccine 3+ Years old  Completed   Hepatitis C Screening  Completed   HPV VACCINES  Aged Out    Health Maintenance  Health Maintenance Due  Topic Date Due   FOOT EXAM  Never done   Zoster Vaccines- Shingrix (1 of 2) Never done   COVID-19 Vaccine (3 - Moderna risk series) 03/13/2020   OPHTHALMOLOGY EXAM  06/05/2022   INFLUENZA VACCINE  06/26/2023    Colorectal cancer screening: Type of screening: Colonoscopy. Completed 01/11/20. Repeat every 7 years     Additional Screening:  Hepatitis C Screening: does qualify; Completed 12/23/16  Vision Screening: Recommended annual ophthalmology exams for early detection of glaucoma and other disorders of the eye. Is the patient up to date with their annual eye exam?  Yes Who is the provider or what is the name of the office in which the patient attends annual eye exams? Deferred If pt is not established with a provider, would they like to be referred to a provider to establish care? No .   Dental Screening: Recommended annual dental exams for proper oral hygiene    Community Resource Referral /  Chronic Care Management:  CRR required this visit?  No   CCM required this visit?  No     Plan:     I have personally reviewed and noted the following in the patient's chart:   Medical and social history Use of alcohol, tobacco or illicit drugs  Current medications and supplements including opioid prescriptions. Patient is not currently taking opioid prescriptions. Functional ability and status Nutritional status Physical activity Advanced directives List of other physicians Hospitalizations, surgeries, and ER visits in previous 12 months Vitals Screenings to include cognitive, depression, and falls Referrals and appointments  In addition, I have reviewed and discussed with patient certain preventive protocols, quality metrics, and best practice recommendations. A written personalized care plan for preventive services as well as general preventive health recommendations were provided to patient.     Tillie Rung, LPN   [id number]/08/[id number]   After Visit Summary: (MyChart) Due to this being a telephonic visit, the after visit summary with patients personalized plan was offered to patient via MyChart   Nurse Notes: None

## 2023-11-26 ENCOUNTER — Other Ambulatory Visit: Payer: Self-pay | Admitting: Family Medicine

## 2023-12-05 ENCOUNTER — Ambulatory Visit: Payer: Medicare HMO | Admitting: Family Medicine

## 2023-12-15 ENCOUNTER — Other Ambulatory Visit: Payer: Self-pay | Admitting: Family Medicine

## 2023-12-16 ENCOUNTER — Ambulatory Visit (INDEPENDENT_AMBULATORY_CARE_PROVIDER_SITE_OTHER): Payer: Medicare HMO | Admitting: Family Medicine

## 2023-12-16 ENCOUNTER — Encounter: Payer: Self-pay | Admitting: Family Medicine

## 2023-12-16 VITALS — BP 104/76 | HR 66 | Temp 97.6°F | Ht 69.0 in | Wt 187.1 lb

## 2023-12-16 DIAGNOSIS — E1165 Type 2 diabetes mellitus with hyperglycemia: Secondary | ICD-10-CM | POA: Diagnosis not present

## 2023-12-16 DIAGNOSIS — E785 Hyperlipidemia, unspecified: Secondary | ICD-10-CM | POA: Diagnosis not present

## 2023-12-16 DIAGNOSIS — I10 Essential (primary) hypertension: Secondary | ICD-10-CM

## 2023-12-16 DIAGNOSIS — L821 Other seborrheic keratosis: Secondary | ICD-10-CM

## 2023-12-16 DIAGNOSIS — Z7984 Long term (current) use of oral hypoglycemic drugs: Secondary | ICD-10-CM

## 2023-12-16 LAB — POCT GLYCOSYLATED HEMOGLOBIN (HGB A1C): Hemoglobin A1C: 6.5 % — AB (ref 4.0–5.6)

## 2023-12-16 NOTE — Progress Notes (Signed)
Established Patient Office Visit  Subjective   Patient ID: Jon Moses, male    DOB: 04-Nov-1951  Age: 73 y.o. MRN: [id number]  Chief Complaint  Patient presents with   mole on the face     Started 3 months ago, face, left check under the eye,     HPI   Donal has history of hypertension, hyperlipidemia, type 2 diabetes, CVA.  He is seen today for the following issues  Rapidly growing "mole "left cheek region.  First noted a few months ago but rapidly growing.  No itching.  No pain.  No bleeding.  His current medications are reviewed and include Aldactone 25 mg daily, Benicar HCTZ 40/12.5 mg daily, metformin 500 mg twice daily, hydralazine 25 mg twice daily, atorvastatin 40 mg daily, aspirin 81 mg daily, and amlodipine 10 mg daily.  Denies any recent dizziness.  Blood pressure well-controlled.  Lipids were checked last summer and stable.  He states has been compliant with all medications.  No recent chest pains.  Past Medical History:  Diagnosis Date   Diabetes mellitus without complication (HCC)    Hyperlipidemia 03/26/2011   HYPERTENSION 07/17/2010   Past Surgical History:  Procedure Laterality Date   COLONOSCOPY     11 yrs ago from 2021   INNER EAR SURGERY  1975   left   LIPOMA EXCISION Left 12/24/2013   Procedure: EXCISION MASS LEFT BACK;  Surgeon: Liz Malady, MD;  Location: [facility];  Service: General;  Laterality: Left;   TONSILLECTOMY      reports that he has never smoked. He has never used smokeless tobacco. He reports current alcohol use. He reports that he does not use drugs. family history includes Hypertension in his mother and another family member; Stroke in his father. Allergies  Allergen Reactions   Ace Inhibitors Cough    Review of Systems  Constitutional:  Negative for chills, fever and malaise/fatigue.  Eyes:  Negative for blurred vision.  Respiratory:  Negative for shortness of breath.   Cardiovascular:  Negative for chest  pain.  Gastrointestinal:  Negative for abdominal pain.  Neurological:  Negative for dizziness, weakness and headaches.      Objective:     BP 104/76 (BP Location: Left Arm, Patient Position: Sitting, Cuff Size: Large)   Pulse 66   Temp 97.6 F (36.4 C) (Oral)   Ht 5\' 9"  (1.753 m)   Wt 187 lb 1.6 oz (84.9 kg)   SpO2 97%   BMI 27.63 kg/m  BP Readings from Last 3 Encounters:  12/16/23 104/76  09/30/23 122/78  06/03/23 116/62   Wt Readings from Last 3 Encounters:  12/16/23 187 lb 1.6 oz (84.9 kg)  11/17/23 188 lb (85.3 kg)  09/30/23 188 lb 14.4 oz (85.7 kg)      Physical Exam Vitals reviewed.  Constitutional:      General: He is not in acute distress.    Appearance: He is well-developed. He is not ill-appearing.  HENT:     Right Ear: External ear normal.     Left Ear: External ear normal.  Eyes:     Pupils: Pupils are equal, round, and reactive to light.  Neck:     Thyroid: No thyromegaly.  Cardiovascular:     Rate and Rhythm: Normal rate and regular rhythm.  Pulmonary:     Effort: Pulmonary effort is normal. No respiratory distress.     Breath sounds: Normal breath sounds. No wheezing or rales.  Musculoskeletal:  Cervical back: Neck supple.     Right lower leg: No edema.     Left lower leg: No edema.  Skin:    Comments: Left cheek area about 2 cm below the eye has scaly verrucous lesion about 4 to 5 mm diameter  Neurological:     Mental Status: He is alert and oriented to person, place, and time.      Results for orders placed or performed in visit on 12/16/23  POC HgB A1c  Result Value Ref Range   Hemoglobin A1C 6.5 (A) 4.0 - 5.6 %   HbA1c POC (<> result, manual entry)     HbA1c, POC (prediabetic range)     HbA1c, POC (controlled diabetic range)      Last CBC Lab Results  Component Value Date   WBC 4.7 06/03/2023   HGB 10.2 (L) 06/03/2023   HCT 31.3 (L) 06/03/2023   MCV 82.6 06/03/2023   RDW 14.5 06/03/2023   PLT 339.0 06/03/2023   Last  metabolic panel Lab Results  Component Value Date   GLUCOSE 121 (H) 05/23/2023   NA 133 (L) 05/23/2023   K 4.2 05/23/2023   CL 99 05/23/2023   CO2 29 05/23/2023   BUN 19 05/23/2023   CREATININE 1.42 05/23/2023   GFR 50.05 (L) 05/23/2023   CALCIUM 9.5 05/23/2023   PROT 7.6 12/25/2022   ALBUMIN 4.5 12/25/2022   BILITOT 0.4 12/25/2022   ALKPHOS 51 12/25/2022   AST 16 12/25/2022   ALT 20 12/25/2022   Last lipids Lab Results  Component Value Date   CHOL 123 12/25/2022   HDL 45.50 12/25/2022   LDLCALC 54 12/25/2022   LDLDIRECT 175.2 10/11/2013   TRIG 120.0 12/25/2022   CHOLHDL 3 12/25/2022   Last hemoglobin A1c Lab Results  Component Value Date   HGBA1C 6.5 (A) 12/16/2023      The ASCVD Risk score (Arnett DK, et al., 2019) failed to calculate for the following reasons:   Risk score cannot be calculated because patient has a medical history suggesting prior/existing ASCVD    Assessment & Plan:   #1 benign-appearing verrucous keratosis left face.  Patient requesting removal.  We discussed liquid nitrogen versus surgical excision.  He prefers the former.  Discussed risk including pain, blistering, low risk of infection, low risk of scarring.  Patient consented.  This was treated without difficulty.  He is aware this should fall off hopefully in the next couple weeks  #2 hypertension stable and well-controlled on Aldactone, amlodipine, and Benicar HCTZ.  Continue current regimen.  #3 hyperlipidemia treated with atorvastatin 40 mg daily.  Goal LDL less than 70.  Recheck fasting lipids at follow-up in 6 months  #4 type 2 diabetes controlled with A1c 6.5%.  Continue metformin.  Continue yearly diabetic eye exam.  Check urine microalbumin screen with next labs   Return in about 6 months (around 06/14/2024).    Evelena Peat, MD

## 2023-12-16 NOTE — Patient Instructions (Addendum)
Skin lesion is a verrucous keratosis which is benign.   A1C 6.5%=controlled  Let's plan on 6 month follow up.

## 2023-12-19 ENCOUNTER — Other Ambulatory Visit: Payer: Self-pay | Admitting: Family Medicine

## 2023-12-19 DIAGNOSIS — I10 Essential (primary) hypertension: Secondary | ICD-10-CM

## 2024-01-03 ENCOUNTER — Other Ambulatory Visit: Payer: Self-pay | Admitting: Family Medicine

## 2024-01-11 ENCOUNTER — Other Ambulatory Visit: Payer: Self-pay | Admitting: Family Medicine

## 2024-01-15 DIAGNOSIS — I10 Essential (primary) hypertension: Secondary | ICD-10-CM | POA: Diagnosis not present

## 2024-01-15 DIAGNOSIS — R419 Unspecified symptoms and signs involving cognitive functions and awareness: Secondary | ICD-10-CM | POA: Diagnosis not present

## 2024-01-15 DIAGNOSIS — I639 Cerebral infarction, unspecified: Secondary | ICD-10-CM | POA: Diagnosis not present

## 2024-01-20 DIAGNOSIS — R419 Unspecified symptoms and signs involving cognitive functions and awareness: Secondary | ICD-10-CM | POA: Insufficient documentation

## 2024-03-02 ENCOUNTER — Encounter: Payer: Self-pay | Admitting: Family Medicine

## 2024-03-02 ENCOUNTER — Ambulatory Visit (INDEPENDENT_AMBULATORY_CARE_PROVIDER_SITE_OTHER): Admitting: Family Medicine

## 2024-03-02 VITALS — BP 124/82 | HR 70 | Temp 98.2°F | Ht 69.0 in | Wt 193.0 lb

## 2024-03-02 DIAGNOSIS — L0201 Cutaneous abscess of face: Secondary | ICD-10-CM

## 2024-03-02 MED ORDER — DOXYCYCLINE HYCLATE 100 MG PO TABS: 100.0000 mg | ORAL_TABLET | Freq: Two times a day (BID) | ORAL | 0 refills | Status: AC

## 2024-03-02 NOTE — Progress Notes (Signed)
   Acute Office Visit   Subjective:  Patient ID: Jon Moses, male    DOB: August 03, 1951, 73 y.o.   MRN: [id number]  Chief Complaint  Patient presents with   Mass    HPI Patient reports he has a bump over his right eye brow. He reports he noticed it Saturday night. He reports the size has improved some, not as big as it was. However, the area is still sensitive. Denies any drainage. Denies an insect bite from being outside earlier on Saturday. Denies itching.   ROS See HPI above      Objective:   BP 124/82   Pulse 70   Temp 98.2 F (36.8 C) (Oral)   Ht 5\' 9"  (1.753 m)   Wt 193 lb (87.5 kg)   SpO2 95%   BMI 28.50 kg/m    Physical Exam Vitals reviewed.  Constitutional:      General: He is not in acute distress.    Appearance: Normal appearance. He is not ill-appearing, toxic-appearing or diaphoretic.  Eyes:     General:        Right eye: No discharge.        Left eye: No discharge.     Conjunctiva/sclera: Conjunctivae normal.  Cardiovascular:     Rate and Rhythm: Normal rate.  Pulmonary:     Effort: Pulmonary effort is normal. No respiratory distress.  Musculoskeletal:        General: Normal range of motion.  Skin:    General: Skin is warm and dry.     Findings: Abscess (Right eye brow: See picture included) present.  Neurological:     General: No focal deficit present.     Mental Status: He is alert and oriented to person, place, and time. Mental status is at baseline.  Psychiatric:        Mood and Affect: Mood normal.        Behavior: Behavior normal.        Thought Content: Thought content normal.        Judgment: Judgment normal.         Assessment & Plan:  Cutaneous abscess of face -     Doxycycline Hyclate; Take 1 tablet (100 mg total) by mouth 2 (two) times daily for 7 days.  Dispense: 14 tablet; Refill: 0  -Recommend to gently cleanse the area with a mild soap, such as Dial Antibacterial, twice a day and pat dry.  -May use warm or cool compresses  to the area 4-6 times a day, for 20 minutes to help with discomfort.  -Prescribed Doxycycline 100mg  tablet, take 1 tablet every 12 hours (twice a day) for 7 days. Please wear sun screen if you will be out side in the sun while taking medication. -Discussed about red flags that would prompt an urgent return. -Follow up if not improved or symptoms become worse.   Zandra Abts, NP

## 2024-03-02 NOTE — Patient Instructions (Signed)
-  Recommend to gently cleanse the area with a mild soap, such as Dial Antibacterial, twice a day and pat dry.  -May use warm or cool compresses to the area 4-6 times a day, for 20 minutes to help with discomfort.  -Prescribed Doxycycline 100mg  tablet, take 1 tablet every 12 hours (twice a day) for 7 days. Please wear sun screen if you will be out side in the sun while taking medication. -Discussed about red flags that would prompt an urgent return. -Follow up if not improved or symptoms become worse.

## 2024-03-03 ENCOUNTER — Ambulatory Visit: Admitting: Family Medicine

## 2024-03-03 ENCOUNTER — Telehealth: Payer: Self-pay

## 2024-03-03 NOTE — Telephone Encounter (Signed)
 Left a message for the patient to return my call.

## 2024-03-03 NOTE — Telephone Encounter (Signed)
 Copied from CRM [id number]. Topic: General - Other >> Mar 03, 2024 11:47 AM Almira Coaster wrote: Reason for CRM: Patient was seen in the office yesterday and per patient he was told Dr.Burchette would be giving him a call today. He hasn't received a call and would like to speak with him if possible.

## 2024-03-04 NOTE — Telephone Encounter (Signed)
 Patient reported he is doing well on Doxycycline and informed to follow up if not improving.

## 2024-03-04 NOTE — Telephone Encounter (Signed)
 Pt is returning mykal call

## 2024-03-05 ENCOUNTER — Telehealth: Payer: Self-pay

## 2024-03-05 NOTE — Telephone Encounter (Signed)
 Copied from CRM [id number]. Topic: General - Other >> Mar 05, 2024  1:30 PM Florestine Avers wrote: Reason for CRM: Patient called in stating that he had a missed call from [facility] and if he could please call him back. Patient did not disclose what it was in reference to.

## 2024-03-05 NOTE — Telephone Encounter (Signed)
 Please see previous encounter

## 2024-03-10 ENCOUNTER — Other Ambulatory Visit: Payer: Self-pay | Admitting: Family Medicine

## 2024-04-21 ENCOUNTER — Other Ambulatory Visit: Payer: Self-pay | Admitting: Family Medicine

## 2024-04-21 DIAGNOSIS — I10 Essential (primary) hypertension: Secondary | ICD-10-CM

## 2024-05-27 ENCOUNTER — Other Ambulatory Visit: Payer: Self-pay | Admitting: Family Medicine

## 2024-06-03 ENCOUNTER — Ambulatory Visit: Payer: Self-pay

## 2024-06-03 NOTE — Telephone Encounter (Signed)
 FYI Only or Action Required?: FYI only for provider.  Patient was last seen in primary care on 03/02/2024 by Billy Philippe SAUNDERS, NP.  Called Nurse Triage reporting Dizziness.  Symptoms began yesterday.  Interventions attempted: Nothing.  Symptoms are: stable.  Triage Disposition: See PCP When Office is Open (Within 3 Days) See note.   Patient/caregiver understands and will follow disposition?: Yes   Copied from CRM [id number]. Topic: Clinical - Red Word Triage >> Jun 03, 2024 10:35 AM Carlyon D wrote: Red Word that prompted transfer to Nurse Triage: Pt Fainted yesterday got lightheaded. Pt is still feeling light headed. bp yesterday was 117/70 pulse was 70  BP today was 112/76 Reason for Disposition  [1] MILD dizziness (e.g., walking normally) AND [2] has NOT been evaluated by doctor (or NP/PA) for this  (Exception: Dizziness caused by heat exposure, sudden standing, or poor fluid intake.)  Answer Assessment - Initial Assessment Questions 1. DESCRIPTION: Describe your dizziness.   2. LIGHTHEADED: Do you feel lightheaded? (e.g., somewhat faint, woozy, weak upon standing)     ------------------- Currently feels lightheaded    3. VERTIGO: Do you feel like either you or the room is spinning or tilting? (i.e., vertigo)     -------------------- Denies    4. SEVERITY: How bad is it?  Do you feel like you are going to faint? Can you stand and walk?     ---------------------------------------- Can stand and walk    5. ONSET:  When did the dizziness begin?     ------------- Yesterday    6. AGGRAVATING FACTORS: Does anything make it worse? (e.g., standing, change in head position)      -------------------------- Denies   7. HEART RATE: Can you tell me your heart rate? How many beats in 15 seconds?  (Note: Not all patients can do this.)       ---- Denies   8. CAUSE: What do you think is causing the dizziness? (e.g., decreased fluids or food, diarrhea,  emotional distress, heat exposure, new medicine, sudden standing, vomiting; unknown)     --- Unsure .   9. RECURRENT SYMPTOM: Have you had dizziness before? If Yes, ask: When was the last time? What happened that time?     ------------------ Denies   10. OTHER SYMPTOMS: Do you have any other symptoms? (e.g., fever, chest pain, vomiting, diarrhea, bleeding)       --------Denies s/s of dehydration     Additional Information:  Pt reports he is able to complete his ADL's appropriately w/o assistance.  BP this am: 110/76  ( baseline 120-130 systolic. )  Attempted to make an appt within dispo, earliest avail was 07/11. Pt declined as her preferred to be seen sooner. Patient scheduled at a local UC appropriately.  Protocols used: Dizziness - Lightheadedness-A-AH

## 2024-06-03 NOTE — Telephone Encounter (Signed)
 FYI Only or Action Required?: FYI only for provider.  Patient was last seen in primary care on 03/02/2024 by Billy Philippe SAUNDERS, NP.  Called Nurse Triage reporting Advice Only.  Symptoms began see previous triage encounter.  Interventions attempted: Other: see previous triage encounter.  Symptoms are: unchanged.  Triage Disposition: Information or Advice Only Call  Patient/caregiver understands and will follow disposition?: Yes                                 Copied from CRM [id number]. Topic: Clinical - Red Word Triage >> Jun 03, 2024 10:35 AM Carlyon D wrote: Red Word that prompted transfer to Nurse Triage: Pt Fainted yesterday got lightheaded. Pt is still feeling light headed. bp yesterday was 117/70 pulse was 70  BP today was 112/76 Reason for Disposition  General information question, no triage required and triager able to answer question  Answer Assessment - Initial Assessment Questions 1. REASON FOR CALL: What is the main reason for your call? or How can I best help you?  Patient was triaged by another RN this morning and advised to see a provider within 3 days. According to note, patient opted for UC because there was no availability in office until 06/04/24. Patient called back in to be scheduled in office. Patient declined worsening symptoms. Scheduled patient for tomorrow, 06/04/24.  Protocols used: Information Only Call - No Triage-A-AH

## 2024-06-04 ENCOUNTER — Other Ambulatory Visit: Payer: Self-pay | Admitting: Family Medicine

## 2024-06-04 ENCOUNTER — Ambulatory Visit: Admitting: Family Medicine

## 2024-06-08 ENCOUNTER — Ambulatory Visit (INDEPENDENT_AMBULATORY_CARE_PROVIDER_SITE_OTHER): Admitting: Family Medicine

## 2024-06-08 ENCOUNTER — Encounter: Payer: Self-pay | Admitting: Family Medicine

## 2024-06-08 VITALS — BP 116/62 | HR 76 | Temp 97.8°F | Wt 187.4 lb

## 2024-06-08 DIAGNOSIS — Z8673 Personal history of transient ischemic attack (TIA), and cerebral infarction without residual deficits: Secondary | ICD-10-CM | POA: Diagnosis not present

## 2024-06-08 DIAGNOSIS — D509 Iron deficiency anemia, unspecified: Secondary | ICD-10-CM

## 2024-06-08 DIAGNOSIS — I950 Idiopathic hypotension: Secondary | ICD-10-CM | POA: Diagnosis not present

## 2024-06-08 DIAGNOSIS — I10 Essential (primary) hypertension: Secondary | ICD-10-CM

## 2024-06-08 DIAGNOSIS — E1165 Type 2 diabetes mellitus with hyperglycemia: Secondary | ICD-10-CM | POA: Diagnosis not present

## 2024-06-08 DIAGNOSIS — Z7984 Long term (current) use of oral hypoglycemic drugs: Secondary | ICD-10-CM

## 2024-06-08 NOTE — Progress Notes (Signed)
 Established Patient Office Visit  Subjective   Patient ID: Jon Moses, male    DOB: 07/28/1951  Age: 73 y.o. MRN: [id number]  Chief Complaint  Patient presents with   Loss of Consciousness    HPI   Jon Moses is seen with episode last Wednesday of dizziness with standing and near syncope.  He has history of hypertension, type 2 diabetes, history of CVA, hyperlipidemia.  Was doing well last Wednesday and had just finished feeding his son breakfast around 9:30 AM when he sat down.  When he stood up he felt very lightheaded and may have had very transient loss of consciousness but not sure.  No head injury.  He states he had not eaten or had anything to drink that morning.  Had not been outside much that week.  No chest pain.  No palpitations.  Has felt fine since then including later that day.  Not clear that he had full syncopal episode.  He denies any recent focal weakness, speech change, or other focal neurologic symptoms  Takes several medications including Aldactone , Benicar  HCTZ, metformin , hydralazine , atorvastatin , aspirin, and amlodipine .  Denies any recent nausea, vomiting, or diarrhea.  Did have significant anemia last year which was determined to be iron deficiency.  He reportedly saw gastroenterology though I cannot locate any specific GI records.  His last record of colonoscopy was 2/21 when he had a couple of adenomas.  Past Medical History:  Diagnosis Date   Diabetes mellitus without complication (HCC)    Hyperlipidemia 03/26/2011   HYPERTENSION 07/17/2010   Past Surgical History:  Procedure Laterality Date   COLONOSCOPY     11 yrs ago from 2021   INNER EAR SURGERY  1975   left   LIPOMA EXCISION Left 12/24/2013   Procedure: EXCISION MASS LEFT BACK;  Surgeon: Dann FORBES Hummer, MD;  Location: [facility];  Service: General;  Laterality: Left;   TONSILLECTOMY      reports that he has never smoked. He has never used smokeless tobacco. He reports current alcohol  use. He reports that he does not use drugs. family history includes Hypertension in his mother and another family member; Stroke in his father. Allergies  Allergen Reactions   Ace Inhibitors Cough      Review of Systems  Constitutional:  Negative for chills and fever.  Eyes:  Negative for blurred vision.  Respiratory:  Negative for shortness of breath.   Cardiovascular:  Negative for chest pain.  Gastrointestinal:  Negative for abdominal pain, diarrhea, nausea and vomiting.  Genitourinary:  Negative for dysuria.  Neurological:  Negative for speech change, focal weakness and seizures.       See HPI      Objective:     BP 116/62 (BP Location: Left Arm, Patient Position: Sitting, Cuff Size: Normal)   Pulse 76   Temp 97.8 F (36.6 C) (Oral)   Wt 187 lb 6.4 oz (85 kg)   SpO2 99%   BMI 27.67 kg/m  BP Readings from Last 3 Encounters:  06/08/24 116/62  03/02/24 124/82  12/16/23 104/76   Wt Readings from Last 3 Encounters:  06/08/24 187 lb 6.4 oz (85 kg)  03/02/24 193 lb (87.5 kg)  12/16/23 187 lb 1.6 oz (84.9 kg)      Physical Exam Vitals reviewed.  Constitutional:      General: He is not in acute distress.    Appearance: He is not ill-appearing.  HENT:     Head: Normocephalic and atraumatic.  Neck:     Comments: No carotid bruits Cardiovascular:     Rate and Rhythm: Normal rate and regular rhythm.  Pulmonary:     Effort: Pulmonary effort is normal.     Breath sounds: Normal breath sounds. No wheezing or rales.  Musculoskeletal:     Right lower leg: No edema.     Left lower leg: No edema.  Neurological:     General: No focal deficit present.     Mental Status: He is alert and oriented to person, place, and time.     Cranial Nerves: No cranial nerve deficit.     Motor: No weakness.     Gait: Gait normal.      No results found for any visits on 06/08/24.    The ASCVD Risk score (Arnett DK, et al., 2019) failed to calculate for the following reasons:    Risk score cannot be calculated because patient has a medical history suggesting prior/existing ASCVD    Assessment & Plan:   #1 recent transient dizziness with near syncope.  This occurred 6 days ago.  Symptoms occurred after rapidly standing.  Suspect probably transient hypotension.  Blood pressure today does not show any orthostatic drop from sitting to standing.  Stressed importance of staying well-hydrated.  Continue current medications.  Recheck CBC.  Follow-up immediately for any recurrent symptoms.  He is not describing any consistent orthostatic symptoms  #2 type 2 diabetes.  History of good control.  Patient on metformin .  Recheck A1c.  Continue yearly diabetic eye exam.  #3 hypertension currently controlled on 4 drug regimen including hydralazine , Aldactone , amlodipine , and Benicar  HCTZ.  #4 history of iron deficiency anemia.  Patient reportedly saw GI last year though we cannot locate records of that.  Recheck CBC and iron studies.  #5 hyperlipidemia.  Goal LDL less than 70.  Lipids were checked last October.  Recheck at 79-month follow-up   No follow-ups on file.    Wolm Scarlet, MD

## 2024-06-09 ENCOUNTER — Ambulatory Visit: Payer: Self-pay | Admitting: Family Medicine

## 2024-06-09 ENCOUNTER — Telehealth: Payer: Self-pay

## 2024-06-09 DIAGNOSIS — R7989 Other specified abnormal findings of blood chemistry: Secondary | ICD-10-CM

## 2024-06-09 LAB — COMPREHENSIVE METABOLIC PANEL WITH GFR
ALT: 17 U/L (ref 0–53)
AST: 15 U/L (ref 0–37)
Albumin: 4.3 g/dL (ref 3.5–5.2)
Alkaline Phosphatase: 49 U/L (ref 39–117)
BUN: 21 mg/dL (ref 6–23)
CO2: 25 meq/L (ref 19–32)
Calcium: 9.3 mg/dL (ref 8.4–10.5)
Chloride: 100 meq/L (ref 96–112)
Creatinine, Ser: 1.44 mg/dL (ref 0.40–1.50)
GFR: 48.86 mL/min — ABNORMAL LOW (ref >60.00)
Glucose, Bld: 140 mg/dL — ABNORMAL HIGH (ref 70–99)
Potassium: 4.5 meq/L (ref 3.5–5.1)
Sodium: 133 meq/L — ABNORMAL LOW (ref 135–145)
Total Bilirubin: 0.3 mg/dL (ref 0.2–1.2)
Total Protein: 7.1 g/dL (ref 6.0–8.3)

## 2024-06-09 LAB — CBC WITH DIFFERENTIAL/PLATELET
Basophils Absolute: 0.1 K/uL (ref 0.0–0.1)
Basophils Relative: 2.1 % (ref 0.0–3.0)
Eosinophils Absolute: 0.1 K/uL (ref 0.0–0.7)
Eosinophils Relative: 2.8 % (ref 0.0–5.0)
HCT: 23.3 % — CL (ref 39.0–52.0)
Hemoglobin: 7.4 g/dL — CL (ref 13.0–17.0)
Lymphocytes Relative: 26.6 % (ref 12.0–46.0)
Lymphs Abs: 1 K/uL (ref 0.7–4.0)
MCHC: 31.6 g/dL (ref 30.0–36.0)
MCV: 78.3 fl (ref 78.0–100.0)
Monocytes Absolute: 0.4 K/uL (ref 0.1–1.0)
Monocytes Relative: 9.4 % (ref 3.0–12.0)
Neutro Abs: 2.3 K/uL (ref 1.4–7.7)
Neutrophils Relative %: 59.1 % (ref 43.0–77.0)
Platelets: 370 K/uL (ref 150.0–400.0)
RBC: 2.97 Mil/uL — ABNORMAL LOW (ref 4.22–5.81)
RDW: 15.9 % — ABNORMAL HIGH (ref 11.5–15.5)
WBC: 3.8 K/uL — ABNORMAL LOW (ref 4.0–10.5)

## 2024-06-09 LAB — HEMOGLOBIN A1C: Hgb A1c MFr Bld: 6.7 % — ABNORMAL HIGH (ref 4.6–6.5)

## 2024-06-09 LAB — IRON,TIBC AND FERRITIN PANEL
%SAT: 6 % — ABNORMAL LOW (ref 20–48)
Ferritin: 3 ng/mL — ABNORMAL LOW (ref 24–380)
Iron: 23 ug/dL — ABNORMAL LOW (ref 50–180)
TIBC: 375 ug/dL (ref 250–425)

## 2024-06-09 NOTE — Telephone Encounter (Signed)
 CRITICAL VALUE STICKER  CRITICAL VALUE: Hemoglobin- 7.4, Hematocrit- 23.3   RECEIVER (on-site recipient of call): Jon Moses   DATE & TIME NOTIFIED: 11:48 am  MESSENGER (representative from lab): Darice   MD NOTIFIED: Verbal and via Epic   TIME OF NOTIFICATION: 11:48 am   RESPONSE:  N.A

## 2024-06-10 ENCOUNTER — Telehealth: Payer: Self-pay | Admitting: Gastroenterology

## 2024-06-10 ENCOUNTER — Ambulatory Visit: Payer: Self-pay

## 2024-06-10 ENCOUNTER — Other Ambulatory Visit

## 2024-06-10 ENCOUNTER — Telehealth: Payer: Self-pay | Admitting: Family Medicine

## 2024-06-10 ENCOUNTER — Telehealth: Payer: Self-pay | Admitting: *Deleted

## 2024-06-10 ENCOUNTER — Telehealth: Payer: Self-pay

## 2024-06-10 DIAGNOSIS — R7989 Other specified abnormal findings of blood chemistry: Secondary | ICD-10-CM

## 2024-06-10 LAB — CBC WITH DIFFERENTIAL/PLATELET
Basophils Absolute: 0.1 K/uL (ref 0.0–0.1)
Basophils Relative: 1.8 % (ref 0.0–3.0)
Eosinophils Absolute: 0.1 K/uL (ref 0.0–0.7)
Eosinophils Relative: 3.7 % (ref 0.0–5.0)
HCT: 23 % — CL (ref 39.0–52.0)
Hemoglobin: 7.4 g/dL — CL (ref 13.0–17.0)
Lymphocytes Relative: 36.8 % (ref 12.0–46.0)
Lymphs Abs: 1.3 K/uL (ref 0.7–4.0)
MCHC: 32 g/dL (ref 30.0–36.0)
MCV: 77.8 fl — ABNORMAL LOW (ref 78.0–100.0)
Monocytes Absolute: 0.4 K/uL (ref 0.1–1.0)
Monocytes Relative: 11 % (ref 3.0–12.0)
Neutro Abs: 1.6 K/uL (ref 1.4–7.7)
Neutrophils Relative %: 46.7 % (ref 43.0–77.0)
Platelets: 375 K/uL (ref 150.0–400.0)
RBC: 2.96 Mil/uL — ABNORMAL LOW (ref 4.22–5.81)
RDW: 16 % — ABNORMAL HIGH (ref 11.5–15.5)
WBC: 3.4 K/uL — ABNORMAL LOW (ref 4.0–10.5)

## 2024-06-10 NOTE — Telephone Encounter (Signed)
 The pt has been set up for 06/24/24 at  130 pm with Harlene. Can you please call the pt?

## 2024-06-10 NOTE — Telephone Encounter (Signed)
 Copied from CRM [id number]. Topic: Clinical - Red Word Triage >> Jun 10, 2024  4:23 PM Marissa P wrote: Red Word that prompted transfer to Nurse Triage:  Karpuih from [facility] calling. Patient has: Low hemoglobin of 7.4 and some dizziness needs to get scheduled please. Answer Assessment - Initial Assessment Questions 1. REASON FOR CALL: What is the main reason for your call? or How can I best help you?     Staff member Karpuih from PCP office called requesting to schedule appt for patient to receive pRBC's per doctor's order. This RN advised that she was contacting the nurse triage line and not the hospital. Advised to contact PCP for clarification.  Protocols used: Information Only Call - No Triage-A-AH

## 2024-06-10 NOTE — Telephone Encounter (Signed)
 Spoke with the patient and informed him of the results.  Patient stated he does not have a GI.  After reviewing the chart, patient was given the number to contact [facility] at [phone] for an appt as he was seen by Dr Wilhelmenia for a colonoscopy in 2021. Patient stated he will be driving himself, does not have anyone to drive him and has to get his grandson ready for school this morning.  Lab appt scheduled today to arrive at 1:10pm.

## 2024-06-10 NOTE — Addendum Note (Signed)
 Addended by: CHRISTYNE IDELL LABOR on: 06/10/2024 08:33 AM   Modules accepted: Orders

## 2024-06-10 NOTE — Telephone Encounter (Signed)
 Copied from CRM [id number]. Topic: Appointments - Appointment Info/Confirmation >> Jun 10, 2024  8:44 AM Rosina BIRCH wrote: Patient/patient representative is calling for information regarding an appointment.   Patient would like to be called in regards in to his lab appointment today. Patient states lives an hour away so he would like to be called asap because he has other appointments in [place] [address] [phone]

## 2024-06-10 NOTE — Telephone Encounter (Signed)
 Spoke with the patient and informed him Dr Micheal advised repeat lab work today or tomorrow due to low hemoglobin and if it drops any further he may need a blood transfusion.  Patient stated he was wanting to have the appt with GI and lab appt here today since he lives an hour away.  Patient was advised I am not aware if the GI office would see him today as he would need to contact their office for an appt and PCP advised labs TODAY or TOMORROW.

## 2024-06-10 NOTE — Telephone Encounter (Signed)
 Attempted to contact blood infusion place to schedule an appt for pt. 1st attempt- spoke to [facility] and was transferred to [facility] Triage Nurse. They states they don't schedule an appt and it would be done at hospital setting.   2nd attempt- spoke a representative and transfer. Then transfer back. She states the facility is close at 4:30pm and to call back tomorrow. They open at 8:30am.  Will call tomorrow.   Attempted to inform pt about us  working on the order. Left a voicemail to call us  back.   Order form was signed off by Dr. Theophilus and faxed successfully to short stay.

## 2024-06-10 NOTE — Telephone Encounter (Signed)
 CRITICAL VALUE STICKER  CRITICAL VALUE: Hemoglobin- 7.4  Hematocrit-23.0  RECEIVER (on-site recipient of call):  DATE & TIME NOTIFIED: 06/10/2024 @3 :18pm  MESSENGER (representative from lab): Niels Mungo  MD NOTIFIED:   TIME OF NOTIFICATION:  RESPONSE:

## 2024-06-10 NOTE — Telephone Encounter (Signed)
 Patient advised.

## 2024-06-10 NOTE — Telephone Encounter (Signed)
 Attempted to reach pt. Left a voicemail to call us  back regarding to his lab result.

## 2024-06-10 NOTE — Telephone Encounter (Signed)
 Inbound call from patient stating he was seen with his PCP yesterday and had labs completed and was advised his hemoglobin was low and to follow up with GI as soon as possible. Advised patient that next available is currently in September and I will send a note over to the nurse. Please advise, thank you.

## 2024-06-10 NOTE — Telephone Encounter (Signed)
 Copied from CRM [id number]. Topic: General - Other >> Jun 10, 2024  9:49 AM Sophia H wrote: Reason for CRM: Spoke with patient who is calling in to speak with Dr. Parks nurse. Patient states he was told his situation is urgent and he feels that [employer] is not on the same page as Dr. Micheal. Appointment scheduled for 07/31 with gastro and he believes that may be too far out. Please advise # [phone]

## 2024-06-11 ENCOUNTER — Ambulatory Visit: Payer: Self-pay | Admitting: Family Medicine

## 2024-06-11 ENCOUNTER — Encounter (HOSPITAL_COMMUNITY)

## 2024-06-11 ENCOUNTER — Other Ambulatory Visit

## 2024-06-11 ENCOUNTER — Telehealth: Payer: Self-pay

## 2024-06-11 DIAGNOSIS — D509 Iron deficiency anemia, unspecified: Secondary | ICD-10-CM

## 2024-06-11 DIAGNOSIS — D649 Anemia, unspecified: Secondary | ICD-10-CM

## 2024-06-11 NOTE — Telephone Encounter (Signed)
-----   Message from Wolm Scarlet sent at 06/09/2024  5:34 PM EDT ----- Yogesh Cominsky,  I spoke with Mr Sax.  He does not recall which gastroenterologist he has seen previously.   I recommended to Mr Linnemann: 1) no driving until further evaluated 2) urgent referral to local gastroenterologist.  He has seen [facility] in the past and is okay with seeing someone in our group 3) repeat CBC but this Friday if possible to assess stability of hemoglobin 4) make sure he is not taking any nonsteroidals at this time 5) Go immediately to the ER for any extreme dizziness, shortness of breath, or any obvious visible signs of bleeding  Wolm LELON Scarlet MD [facility] at [facility]

## 2024-06-11 NOTE — Telephone Encounter (Signed)
 Copied from CRM [id number]. Topic: General - Other >> Jun 11, 2024 11:30 AM Rosina BIRCH wrote: Reason for CRM: patient stating he was returning a call to [facility] and he stated he need clarity on if he should be getting shots or not CB [phone]

## 2024-06-11 NOTE — Telephone Encounter (Signed)
 I spoke with the patient and informed him that no lab appt was needed at this time as he has had this completed yesterday. Patient voiced understanding and was provided number to shortstay for blood transfusion per previous note

## 2024-06-11 NOTE — Addendum Note (Signed)
 Addended by: METTA KRISTEN CROME on: 06/11/2024 10:23 AM   Modules accepted: Orders

## 2024-06-11 NOTE — Telephone Encounter (Signed)
 Attempted to reach pt. Left a voicemail to call us back.

## 2024-06-11 NOTE — Telephone Encounter (Signed)
 Patient informed of the message below and voiced understanding. Referral placed and patient aware. Labs placed.

## 2024-06-11 NOTE — Telephone Encounter (Signed)
 After reviewing his chart, I think it is safe to wait until Monday for the blood transfusion. Until then he should stay out of the heat, avoid any heavy exertion, and drink lots of fluids .

## 2024-06-11 NOTE — Telephone Encounter (Signed)
 Left a message for the patient to return my call.

## 2024-06-11 NOTE — Telephone Encounter (Signed)
 Call the facility back today at [phone]. Spoke to [place], Psychologist, sport and exercise. She updates they had scheduled pt an appt on 7/23 after they received the fax on the order.

## 2024-06-14 NOTE — Telephone Encounter (Signed)
 Noted

## 2024-06-16 ENCOUNTER — Non-Acute Institutional Stay (HOSPITAL_COMMUNITY): Admission: RE | Admit: 2024-06-16 | Source: Ambulatory Visit

## 2024-06-17 ENCOUNTER — Telehealth: Payer: Self-pay

## 2024-06-17 NOTE — Telephone Encounter (Signed)
 Copied from CRM [id number]. Topic: General - Other >> Jun 16, 2024  4:55 PM Santiya F wrote: Reason for CRM: Patient is calling in because he is requesting to have his appointment reminders mailed to him via the postal service or called. He says he does like using MyChart.

## 2024-06-17 NOTE — Telephone Encounter (Signed)
 Patient informed that appointment reminders are sent to his cell phone and to check this often for appt reminders

## 2024-06-18 ENCOUNTER — Other Ambulatory Visit: Payer: Self-pay | Admitting: Family Medicine

## 2024-06-18 DIAGNOSIS — I10 Essential (primary) hypertension: Secondary | ICD-10-CM

## 2024-06-22 ENCOUNTER — Telehealth: Payer: Self-pay

## 2024-06-22 NOTE — Telephone Encounter (Signed)
 I spoke with the patient and informed him that he missed appt for infusion which was scheduled for 06/16/2024. Patient was advised to call infusion center on [address] to reschedule asap. Information has been sent via Mychart per patient request

## 2024-06-22 NOTE — Telephone Encounter (Signed)
 Copied from CRM [id number]. Topic: Clinical - Lab/Test Results >> Jun 22, 2024  2:19 PM Tiffany S wrote: Reason for CRM: Patient is trying to get an infusion done please call patient to clarifiy if he needs to get infusions done

## 2024-06-23 NOTE — Telephone Encounter (Signed)
 Patient provided contact information to infusion center to schedule asap.

## 2024-06-24 ENCOUNTER — Ambulatory Visit: Admitting: Gastroenterology

## 2024-06-24 ENCOUNTER — Non-Acute Institutional Stay (HOSPITAL_COMMUNITY)

## 2024-06-25 ENCOUNTER — Non-Acute Institutional Stay (HOSPITAL_COMMUNITY)

## 2024-06-29 ENCOUNTER — Non-Acute Institutional Stay (HOSPITAL_COMMUNITY)
Admission: RE | Admit: 2024-06-29 | Discharge: 2024-06-29 | Disposition: A | Discharge status: Home / Self Care | Source: Ambulatory Visit | Attending: Internal Medicine | Admitting: Internal Medicine

## 2024-06-29 VITALS — BP 118/72 | HR 70 | Temp 97.3°F | Resp 14

## 2024-06-29 DIAGNOSIS — D509 Iron deficiency anemia, unspecified: Secondary | ICD-10-CM | POA: Diagnosis not present

## 2024-06-29 LAB — PREPARE RBC (CROSSMATCH)

## 2024-06-29 LAB — ABO/RH: ABO/RH(D): B POS

## 2024-06-29 MED ORDER — SODIUM CHLORIDE 0.9% IV SOLUTION: Freq: Once | INTRAVENOUS | Status: AC

## 2024-06-29 NOTE — Progress Notes (Signed)
 Diagnosis: Iron deficiency anemia  Provider: Wolm Cornfield  Medications; 2 units of PRBC   Pt came to day hospital via POV to receive 2 units of blood per his MD [place],  pt is alert and oriented in NAD,  pt received total 2 units PRBC and we sent 2 tubes of type and screen, consent signed.Patient declined after visit summary. Vital signs stable and documented upon discharge

## 2024-06-30 ENCOUNTER — Telehealth: Payer: Self-pay

## 2024-06-30 ENCOUNTER — Telehealth: Payer: Self-pay | Admitting: Family Medicine

## 2024-06-30 LAB — BPAM RBC
Blood Product Expiration Date: 202508312359
Blood Product Expiration Date: 202509012359
ISSUE DATE / TIME: 202508051121
ISSUE DATE / TIME: 202508051121
Unit Type and Rh: 7300
Unit Type and Rh: 7300

## 2024-06-30 LAB — TYPE AND SCREEN
ABO/RH(D): B POS
Antibody Screen: NEGATIVE
Unit division: 0
Unit division: 0

## 2024-06-30 NOTE — Telephone Encounter (Signed)
 I spoke with the patient and informed him to contact GI office daily for any cancellations. Patient had missed previous appointment with GI on 06/24/2024

## 2024-06-30 NOTE — Telephone Encounter (Signed)
 Copied from CRM #[id number]. Topic: Referral - Question >> Jun 30, 2024  9:41 AM Robinson H wrote: Reason for CRM: Patient calling to speak with Mykal, states he spoke with him regarding a referral to [facility] and states they don't have any appointments until October and was told he needs to be seen sooner, wants to know if Mykal can call and see if he can get in sooner.  [place] [phone]

## 2024-06-30 NOTE — Telephone Encounter (Signed)
 I have reviewed results with patient numerous times and advised him to follow up with GI in regards to low hemoglobin. Can you please advise.

## 2024-06-30 NOTE — Telephone Encounter (Signed)
 Copied from CRM #[id number]. Topic: Clinical - Lab/Test Results >> Jun 30, 2024  9:00 AM Larissa RAMAN wrote: Reason for CRM: Patient requesting a callback to discuss lab results. No provider notes available at this time.

## 2024-06-30 NOTE — Telephone Encounter (Signed)
 Has questions as to why his red blood cell count was low in his most recent labs. Requesting a call to discuss

## 2024-06-30 NOTE — Telephone Encounter (Signed)
 I spoke with the patient and advised him to follow up with GI in regards to recent Hemoglobin results. Patient was provided number to GI to contact them to schedule an appointment

## 2024-07-01 ENCOUNTER — Telehealth: Payer: Self-pay | Admitting: *Deleted

## 2024-07-01 ENCOUNTER — Ambulatory Visit: Payer: Self-pay

## 2024-07-01 ENCOUNTER — Telehealth: Payer: Self-pay

## 2024-07-01 NOTE — Telephone Encounter (Signed)
 Copied from CRM [id number]. Topic: Clinical - Medication Question >> Jul 01, 2024 11:15 AM Mia F wrote: Reason for CRM: Pt was told to schedule with gastro.due to findings in a recent procedure. [place] said they can see him in 3 months. PT says he wanted to see if office can make the appt sooner because he think the gastro office is only making him wait 3 months because he is just a random pt calling and it is not urgent to them. Please advise >> Jul 01, 2024 11:30 AM Candice L wrote: I don't know what else to do for this man.

## 2024-07-01 NOTE — Telephone Encounter (Signed)
 I spoke with the patient and informed him PCP will be reaching out to patient's wife to update her in his current status

## 2024-07-01 NOTE — Telephone Encounter (Signed)
 Copied from CRM [id number]. Topic: Clinical - Medication Question >> Jul 01, 2024 10:44 AM Mia F wrote: Reason for CRM: Pt wife is calling to ask what pt blood levels were that prompted the blood transfusion. When will his levels be checked again. And what is to come after all of this. Please advise    Jackolyn (pt spouse) [phone]

## 2024-07-01 NOTE — Telephone Encounter (Signed)
 This RN relayed information from Dr. Micheal and lab results. Patient's wife states he has a GI appointment scheduled Tuesday 8/12. She is asking if he needs any labs prior or if GI office will handle rechecking labs.  Copied from CRM [id number]. Topic: Clinical - Lab/Test Results >> Jul 01, 2024  3:17 PM Rea C wrote: Reason for CRM: Patient's wife is calling to receive understanding on patients most recent labs.

## 2024-07-02 ENCOUNTER — Telehealth: Payer: Self-pay | Admitting: Family Medicine

## 2024-07-02 NOTE — Telephone Encounter (Signed)
 Copied from CRM [id number]. Topic: Referral - Question >> Jul 02, 2024  2:09 PM Jon Moses wrote: Reason for CRM: Patient calling to speak with Jon Moses states he has a question, earliest Jon Moses can see him is October 10th and was under impression he needs to be seen sooner than that.  Jon Moses [phone]

## 2024-07-02 NOTE — Telephone Encounter (Signed)
 Left message for patient's wife to return my call

## 2024-07-02 NOTE — Telephone Encounter (Signed)
 Please see previous note.

## 2024-07-05 NOTE — Telephone Encounter (Signed)
 Patient informed of the message below and voiced understanding

## 2024-07-06 ENCOUNTER — Telehealth: Payer: Self-pay

## 2024-07-06 NOTE — Telephone Encounter (Signed)
 Copied from CRM #[id number]. Topic: General - Other >> Jul 06, 2024 10:04 AM Berneda FALCON wrote: Reason for CRM: Patient called wanting to know if he can speak to Ednah Hammock about his blood transfusion. Called the CAL to see if he was available. Spoke to [place] who said he was unavailable.  Please call patient back at [phone]

## 2024-07-14 NOTE — Telephone Encounter (Signed)
 Please see previous enounter

## 2024-07-23 ENCOUNTER — Other Ambulatory Visit: Payer: Self-pay | Admitting: Family Medicine

## 2024-08-12 ENCOUNTER — Ambulatory Visit: Payer: Self-pay

## 2024-08-12 NOTE — Telephone Encounter (Signed)
 FYI Only or Action Required?: FYI only for provider.  Patient was last seen in primary care on 06/08/2024 by Jon Moses ORN, MD.  Called Nurse Triage reporting Dizziness.  Symptoms began yesterday.  Interventions attempted: Nothing.  Symptoms are: unchanged.  Triage Disposition: See PCP When Office is Open (Within 3 Days)  Patient/caregiver understands and will follow disposition?: Yes  Copied from CRM #[id number]. Topic: Clinical - Red Word Triage >> Aug 12, 2024  4:24 PM Lauren C wrote: Red Word that prompted transfer to Nurse Triage: Low blood pressure, lightheaded, might be 107/60, but not sure. Reason for Disposition  [1] MILD dizziness (e.g., walking normally) AND [2] has NOT been evaluated by doctor (or NP/PA) for this  (Exception: Dizziness caused by heat exposure, sudden standing, or poor fluid intake.)  Answer Assessment - Initial Assessment Questions 1. DESCRIPTION: Describe your dizziness.     Lightheaded only 2. LIGHTHEADED: Do you feel lightheaded? (e.g., somewhat faint, woozy, weak upon standing)     Intermittently Light headed, described as mild 3. VERTIGO: Do you feel like either you or the room is spinning or tilting? (i.e., vertigo)     denies 4. SEVERITY: How bad is it?  Do you feel like you are going to faint? Can you stand and walk?     Denies difficulty walking 5. ONSET:  When did the dizziness begin?     Two days ago 6. AGGRAVATING FACTORS: Does anything make it worse? (e.g., standing, change in head position)     Not appreciated 7. HEART RATE: Can you tell me your heart rate? How many beats in 15 seconds?  (Note: Not all patients can do this.)       Not measured 8. CAUSE: What do you think is causing the dizziness? (e.g., decreased fluids or food, diarrhea, emotional distress, heat exposure, new medicine, sudden standing, vomiting; unknown)     Not sure, possibly low blood pressure 9. RECURRENT SYMPTOM: Have you had dizziness  before? If Yes, ask: When was the last time? What happened that time?     denies 10. OTHER SYMPTOMS: Do you have any other symptoms? (e.g., fever, chest pain, vomiting, diarrhea, bleeding)       denies 11. PREGNANCY: Is there any chance you are pregnant? When was your last menstrual period?       N/a  Protocols used: Dizziness - Lightheadedness-A-AH

## 2024-08-13 ENCOUNTER — Ambulatory Visit: Admitting: Family Medicine

## 2024-08-18 ENCOUNTER — Ambulatory Visit: Payer: Self-pay

## 2024-08-18 ENCOUNTER — Telehealth: Payer: Self-pay | Admitting: *Deleted

## 2024-08-18 NOTE — Telephone Encounter (Signed)
 Copied from CRM #[id number]. Topic: General - Other >> Aug 18, 2024 12:42 PM Aisha D wrote: Reason for CRM: Pt is requesting to speak with Dr.Burchette or Mykal in regards to a follow up question for BP.

## 2024-08-18 NOTE — Telephone Encounter (Signed)
 Patient has appt 08/19/2024 to discuss blood pressure

## 2024-08-18 NOTE — Telephone Encounter (Signed)
 FYI Only or Action Required?: FYI only for provider.  Patient was last seen in primary care on 06/08/2024 by Micheal Wolm ORN, MD.  Called Nurse Triage reporting low bp.  Symptoms began today.  Interventions attempted: Nothing.  Symptoms are: stable.  Triage Disposition: See Physician Within 24 Hours  Patient/caregiver understands and will follow disposition?: Yes    Copied from CRM [id number]. Topic: Clinical - Red Word Triage >> Aug 18, 2024  1:56 PM Jon Moses wrote: Red Word that prompted transfer to Nurse Triage: Patients blood pressure was a lot lower than normal for him stated it was 92 over 70 something and is concerned Reason for Disposition  [1] Systolic BP 90-110 AND [2] taking blood pressure medications AND [3] NOT feeling weak or lightheaded  Answer Assessment - Initial Assessment Questions 1. BLOOD PRESSURE: What is your blood pressure? Did you take at least two measurements 5 minutes apart?     92/70 2. ONSET: When did you take your blood pressure?     today 3. HOW: How did you take your blood pressure? (e.g., visiting nurse, automatic home BP monitor)     Automatic 4. HISTORY: Do you have a history of low blood pressure? What is your blood pressure normally?     htn 5. MEDICINES: Are you taking any medicines for blood pressure? If Yes, ask: Have they been changed recently?     denies 6. PULSE RATE: Do you know what your pulse rate is?      70 7. OTHER SYMPTOMS: Have you been sick recently? Have you had a recent injury?     Denies,  8. PREGNANCY: Is there any chance you are pregnant? When was your last menstrual period?     na  Protocols used: Blood Pressure - Low-A-AH

## 2024-08-19 ENCOUNTER — Encounter: Payer: Self-pay | Admitting: Internal Medicine

## 2024-08-19 ENCOUNTER — Telehealth: Payer: Self-pay | Admitting: Gastroenterology

## 2024-08-19 ENCOUNTER — Ambulatory Visit: Admitting: Gastroenterology

## 2024-08-19 ENCOUNTER — Ambulatory Visit: Admitting: Family Medicine

## 2024-08-19 ENCOUNTER — Ambulatory Visit (INDEPENDENT_AMBULATORY_CARE_PROVIDER_SITE_OTHER): Admitting: Internal Medicine

## 2024-08-19 VITALS — BP 130/68 | HR 91 | Temp 98.4°F | Wt 186.1 lb

## 2024-08-19 DIAGNOSIS — I10 Essential (primary) hypertension: Secondary | ICD-10-CM | POA: Diagnosis not present

## 2024-08-19 NOTE — Telephone Encounter (Signed)
 Patient came in late to his appointment. I rescheduled appointment to next available in November. Patient is requesting to speak to a nurse regarding low Hgb.

## 2024-08-19 NOTE — Progress Notes (Deleted)
 Jon Moses [id number] 10-09-1951   Chief Complaint:  Referring Provider: Micheal Wolm ORN, MD Primary GI MD: Dr. Wilhelmenia  HPI: Jon Moses is a 73 y.o. male with past medical history of *** who presents today for evaluation of anemia.    On labs 06/08/2024 found to have hemoglobin of 7.4, hematocrit 23.3, MCV 78.3.  Iron 23, ferritin 38, saturation 6%   Received 2 units PRBCs on 06/29/2024.   Previous GI Procedures/Imaging   Colonoscopy 01/11/2020 - Hemorrhoids found on digital rectal exam.  - Stool in the entire examined colon.  - The examined portion of the ileum was normal.  - Four 2 to 5 mm polyps in the rectum, in the descending colon and in the ascending colon, removed with a cold snare. Resected and retrieved.  - Normal mucosa in the entire examined colon otherwise.  - Non- bleeding non- thrombosed internal hemorrhoids. - Recall 7 years Path: Surgical [P], ascending, descending, rectum, polyps (4) - TUBULAR ADENOMA (X2 FRAGMENTS). - BENIGN COLONIC MUCOSA WITH LYMPHOID AGGREGATES. - NO HIGH GRADE DYSPLASIA OR MALIGNANCY.  Past Medical History:  Diagnosis Date   Diabetes mellitus without complication (HCC)    Hyperlipidemia 03/26/2011   HYPERTENSION 07/17/2010    Past Surgical History:  Procedure Laterality Date   COLONOSCOPY     11 yrs ago from 2021   INNER EAR SURGERY  1975   left   LIPOMA EXCISION Left 12/24/2013   Procedure: EXCISION MASS LEFT BACK;  Surgeon: Dann FORBES Hummer, MD;  Location: [facility];  Service: General;  Laterality: Left;   TONSILLECTOMY      Current Outpatient Medications  Medication Sig Dispense Refill   amLODipine  (NORVASC ) 10 MG tablet TAKE 1 TABLET BY MOUTH EVERY DAY 90 tablet 1   aspirin EC 81 MG tablet Take 81 mg by mouth daily. Swallow whole.     atorvastatin  (LIPITOR) 40 MG tablet TAKE 1 TABLET BY MOUTH EVERY DAY 90 tablet 1   hydrALAZINE  (APRESOLINE ) 25 MG tablet TAKE 1 TABLET BY MOUTH TWICE A DAY 180  tablet 0   metFORMIN  (GLUCOPHAGE ) 500 MG tablet TAKE 1 TABLET BY MOUTH TWICE A DAY 180 tablet 1   olmesartan -hydrochlorothiazide  (BENICAR  HCT) 40-12.5 MG tablet TAKE 1 TABLET BY MOUTH EVERY DAY 90 tablet 1   spironolactone  (ALDACTONE ) 25 MG tablet TAKE 1 TABLET (25 MG TOTAL) BY MOUTH DAILY. 90 tablet 1   No current facility-administered medications for this visit.    Allergies as of 08/19/2024 - Review Complete 06/08/2024  Allergen Reaction Noted   Ace inhibitors Cough 04/26/2020    Family History  Problem Relation Age of Onset   Hypertension Other    Hypertension Mother    Stroke Father    Colon cancer Neg Hx    Colon polyps Neg Hx    Esophageal cancer Neg Hx    Stomach cancer Neg Hx    Rectal cancer Neg Hx     Social History   Tobacco Use   Smoking status: Never   Smokeless tobacco: Never  Vaping Use   Vaping status: Never Used  Substance Use Topics   Alcohol use: Yes    Comment: occ   Drug use: No     Review of Systems:    Constitutional: No weight loss, fever, chills, weakness or fatigue Eyes: No change in vision Ears, Nose, Throat:  No change in hearing or congestion Skin: No rash or itching Cardiovascular: No chest pain, chest pressure or palpitations  Respiratory: No SOB or cough Gastrointestinal: See HPI and otherwise negative Genitourinary: No dysuria or change in urinary frequency Neurological: No headache, dizziness or syncope Musculoskeletal: No new muscle or joint pain Hematologic: No bleeding or bruising    Physical Exam:  Vital signs: There were no vitals taken for this visit.  Constitutional: NAD, Well developed, Well nourished, alert and cooperative Head:  Normocephalic and atraumatic.  Eyes: No scleral icterus. Conjunctiva pink. Mouth: No oral lesions. Respiratory: Respirations even and unlabored. Lungs clear to auscultation bilaterally.  No wheezes, crackles, or rhonchi.  Cardiovascular:  Regular rate and rhythm. No murmurs. No  peripheral edema. Gastrointestinal:  Soft, nondistended, nontender. No rebound or guarding. Normal bowel sounds. No appreciable masses or hepatomegaly. Rectal:  Not performed.  Neurologic:  Alert and oriented x4;  grossly normal neurologically.  Skin:   Dry and intact without significant lesions or rashes. Psychiatric: Oriented to person, place and time. Demonstrates good judgement and reason without abnormal affect or behaviors.   RELEVANT LABS AND IMAGING: CBC    Component Value Date/Time   WBC 3.4 (L) 06/10/2024 1256   RBC 2.96 (L) 06/10/2024 1256   HGB 7.4 Repeated and verified X2. (LL) 06/10/2024 1256   HCT 23.0 Repeated and verified X2. (LL) 06/10/2024 1256   PLT 375.0 06/10/2024 1256   MCV 77.8 (L) 06/10/2024 1256   MCHC 32.0 06/10/2024 1256   RDW 16.0 (H) 06/10/2024 1256   LYMPHSABS 1.3 06/10/2024 1256   MONOABS 0.4 06/10/2024 1256   EOSABS 0.1 06/10/2024 1256   BASOSABS 0.1 06/10/2024 1256    CMP     Component Value Date/Time   NA 133 (L) 06/08/2024 1534   K 4.5 06/08/2024 1534   CL 100 06/08/2024 1534   CO2 25 06/08/2024 1534   GLUCOSE 140 (H) 06/08/2024 1534   BUN 21 06/08/2024 1534   CREATININE 1.44 06/08/2024 1534   CALCIUM  9.3 06/08/2024 1534   PROT 7.1 06/08/2024 1534   ALBUMIN 4.3 06/08/2024 1534   AST 15 06/08/2024 1534   ALT 17 06/08/2024 1534   ALKPHOS 49 06/08/2024 1534   BILITOT 0.3 06/08/2024 1534   GFRNONAA 56 (L) 12/21/2013 1520   GFRAA 64 (L) 12/21/2013 1520     Assessment/Plan:   EGD/colonoscopy CBC, CMP, iron/ferritin, B12, folate    Camie Furbish, PA-C [facility] 08/19/2024, 9:59 AM  Patient Care Team: Micheal Wolm ORN, MD as PCP - General Dunn, Maude POUR (Optometry)

## 2024-08-19 NOTE — Telephone Encounter (Signed)
 I was able to speak with the pt and discuss his missed appt today and appt that was cancelled in July.  He was offered an appt for 10/2 with Harlene at 1020 am. He accepted and will keep as set up

## 2024-08-19 NOTE — Progress Notes (Signed)
 Established Patient Office Visit     CC/Reason for Visit: Low blood pressure  HPI: Jon Moses is a 73 y.o. male who is coming in today for the above mentioned reasons. Past Medical History is significant for: Hypertension.  He is concerned because he checked his blood pressure yesterday and he felt it was low at 107/66.  He might have felt some mild dizziness as well.  All symptoms are currently resolved.   Past Medical/Surgical History: Past Medical History:  Diagnosis Date   Diabetes mellitus without complication (HCC)    Hyperlipidemia 03/26/2011   HYPERTENSION 07/17/2010    Past Surgical History:  Procedure Laterality Date   COLONOSCOPY     11 yrs ago from 2021   INNER EAR SURGERY  1975   left   LIPOMA EXCISION Left 12/24/2013   Procedure: EXCISION MASS LEFT BACK;  Surgeon: Dann FORBES Hummer, MD;  Location: [facility];  Service: General;  Laterality: Left;   TONSILLECTOMY      Social History:  reports that he has never smoked. He has never used smokeless tobacco. He reports current alcohol use. He reports that he does not use drugs.  Allergies: Allergies  Allergen Reactions   Ace Inhibitors Cough    Family History:  Family History  Problem Relation Age of Onset   Hypertension Other    Hypertension Mother    Stroke Father    Colon cancer Neg Hx    Colon polyps Neg Hx    Esophageal cancer Neg Hx    Stomach cancer Neg Hx    Rectal cancer Neg Hx      Current Outpatient Medications:    amLODipine  (NORVASC ) 10 MG tablet, TAKE 1 TABLET BY MOUTH EVERY DAY, Disp: 90 tablet, Rfl: 1   aspirin EC 81 MG tablet, Take 81 mg by mouth daily. Swallow whole., Disp: , Rfl:    atorvastatin  (LIPITOR) 40 MG tablet, TAKE 1 TABLET BY MOUTH EVERY DAY, Disp: 90 tablet, Rfl: 1   hydrALAZINE  (APRESOLINE ) 25 MG tablet, TAKE 1 TABLET BY MOUTH TWICE A DAY, Disp: 180 tablet, Rfl: 0   metFORMIN  (GLUCOPHAGE ) 500 MG tablet, TAKE 1 TABLET BY MOUTH TWICE A DAY, Disp: 180  tablet, Rfl: 1   olmesartan -hydrochlorothiazide  (BENICAR  HCT) 40-12.5 MG tablet, TAKE 1 TABLET BY MOUTH EVERY DAY, Disp: 90 tablet, Rfl: 1   spironolactone  (ALDACTONE ) 25 MG tablet, TAKE 1 TABLET (25 MG TOTAL) BY MOUTH DAILY., Disp: 90 tablet, Rfl: 1  Review of Systems:  Negative unless indicated in HPI.   Physical Exam: Vitals:   08/19/24 1543  BP: 130/68  Pulse: 91  Temp: 98.4 F (36.9 C)  TempSrc: Oral  SpO2: 99%  Weight: 186 lb 1.6 oz (84.4 kg)    Body mass index is 27.48 kg/m.   Physical Exam Vitals reviewed.  Constitutional:      Appearance: Normal appearance.  HENT:     Head: Normocephalic and atraumatic.  Eyes:     Conjunctiva/sclera: Conjunctivae normal.  Cardiovascular:     Rate and Rhythm: Normal rate and regular rhythm.  Pulmonary:     Effort: Pulmonary effort is normal.     Breath sounds: Normal breath sounds.  Skin:    General: Skin is warm and dry.  Neurological:     General: No focal deficit present.     Mental Status: He is alert and oriented to person, place, and time.  Psychiatric:        Mood and Affect:  Mood normal.        Behavior: Behavior normal.        Thought Content: Thought content normal.        Judgment: Judgment normal.      Impression and Plan:  Essential hypertension   - No hypotension evidenced on exam today.  Continue current medications and follow-up with PCP as scheduled.  Time spent:21 minutes reviewing chart, interviewing and examining patient and formulating plan of care.     Tully Theophilus Andrews, MD  Primary Care at [facility]

## 2024-08-20 ENCOUNTER — Ambulatory Visit: Payer: Self-pay

## 2024-08-20 NOTE — Telephone Encounter (Signed)
 FYI Only or Action Required?: Action required by provider: request for appointment and clinical question for provider.  Patient was last seen in primary care on 08/19/2024 by Theophilus Andrews, Tully GRADE, MD.  Called Nurse Triage reporting Abdominal Pain.  Symptoms began several days ago.  Interventions attempted: Nothing.  Symptoms are: unchanged.  Triage Disposition: See Physician Within 24 Hours  Patient/caregiver understands and will follow disposition?: Yes  Copied from CRM #[id number]. Topic: Clinical - Red Word Triage >> Aug 20, 2024  3:27 PM Larissa S wrote: [facility] that prompted transfer to Nurse Triage: patient states that he was informed that he has  bleeding in abdomen. Burning in abdomen- worsening Reason for Disposition  [1] MILD pain (e.g., does not interfere with normal activities) AND [2] pain comes and goes (cramps) [3] present > 48 hours  (Exception: This same abdominal pain is a chronic symptom recurrent or ongoing AND present > 4 weeks.)    Pt reports not pain, burning sensation.  Answer Assessment - Initial Assessment Questions No available appts. Advised UC today and ED if symptoms worsen.  Patient reports seen in clinic yesterday, 08/19/24; still having burning in stomach and unsure the plan of care: when will get any better, how long it'll last, what to expect. Pt reports only told to drink water. Reports don't feel any diffent, feels the same burning in stomach, denies feeling worse. Denies shortness of breath, not bleeding, Just still having burning in my stomach that's it.  Nurse contacted CAL, MD not in office today. Reviewed pt's chart, visit was concerning blood pressure, not burning discomfort . Nurse asked patient if mentioned discomfort during visit, patient reports no.  Nurse triaged patient   1. LOCATION: Where does it hurt?  Above belly button, center      2. RADIATION: Does the pain shoot anywhere else? (e.g., chest, back)     Does not, feels  like indegistion; few days ago did pepto bismaul 3. ONSET: When did the pain begin? (Minutes, hours or days ago)  Last night      4. SUDDEN: Gradual or sudden onset?     gradual 5. PATTERN Does the pain come and go, or is it constant?     constant 6. SEVERITY: How bad is the pain?  (e.g., Scale 1-10; mild, moderate, or severe)     5/10; it is not pain, it's just burning feeling 7. RECURRENT SYMPTOM: Have you ever had this type of stomach pain before? If Yes, ask: When was the last time? and What happened that time?  Weeks ago      8. CAUSE: What do you think is causing the stomach pain? (e.g., gallstones, recent abdominal surgery)     no 9. RELIEVING/AGGRAVATING FACTORS: What makes it better or worse? (e.g., antacids, bending or twisting motion, bowel movement)     Its doesn't get better or worse, it's just there 10. OTHER SYMPTOMS: Do you have any other symptoms? (e.g., back pain, diarrhea, fever, urination pain, vomiting)  Denies n/v/d, fever,chills, pain urination, chest pain, dizziness, pass out.  Protocols used: Abdominal Pain - Male-A-AH

## 2024-08-23 NOTE — Telephone Encounter (Signed)
 I spoke with the patient and he reported abdominal pain has subsided and will contact the office for an appointment if symptoms return

## 2024-08-26 ENCOUNTER — Ambulatory Visit: Admitting: Gastroenterology

## 2024-08-31 ENCOUNTER — Telehealth: Payer: Self-pay

## 2024-08-31 ENCOUNTER — Ambulatory Visit: Payer: Self-pay

## 2024-08-31 NOTE — Telephone Encounter (Signed)
 This RN made second attempt to contact patient. No answer, unable to LVM. Routing for additional attempts.

## 2024-08-31 NOTE — Telephone Encounter (Signed)
 Patient has appt tomorrow

## 2024-08-31 NOTE — Telephone Encounter (Signed)
 Unable to reach patient x 3 attempts. Please advise.

## 2024-08-31 NOTE — Telephone Encounter (Signed)
 Copied from CRM [id number]. Topic: Clinical - Red Word Triage >> Aug 31, 2024 11:34 AM Laymon HERO wrote: Red Word that prompted transfer to Nurse Triage: Patient been feeling light headed- last few days. Answer Assessment - Initial Assessment Questions BP 114/68 today after 3 mile walk      1. DESCRIPTION: Describe your dizziness.     Lightheaded  2. LIGHTHEADED: Do you feel lightheaded? (e.g., somewhat faint, woozy, weak upon standing)     Lightheaded  3. VERTIGO: Do you feel like either you or the room is spinning or tilting? (i.e., vertigo)     denies 4. SEVERITY: How bad is it?  Do you feel like you are going to faint? Can you stand and walk?     mild 5. ONSET:  When did the dizziness begin?     Several days ago  6. AGGRAVATING FACTORS: Does anything make it worse? (e.g., standing, change in head position)     Not sure 7. HEART RATE: Can you tell me your heart rate? How many beats in 15 seconds?  (Note: Not all patients can do this.)       90 8. CAUSE: What do you think is causing the dizziness? (e.g., decreased fluids or food, diarrhea, emotional distress, heat exposure, new medicine, sudden standing, vomiting; unknown)     Not sure 9. RECURRENT SYMPTOM: Have you had dizziness before? If Yes, ask: When was the last time? What happened that time?     denies 10. OTHER SYMPTOMS: Do you have any other symptoms? (e.g., fever, chest pain, vomiting, diarrhea, bleeding)       denies  Protocols used: Dizziness - Lightheadedness-A-AH

## 2024-08-31 NOTE — Telephone Encounter (Signed)
 Copied from CRM [id number]. Topic: Clinical - Medical Advice >> Aug 31, 2024  8:01 AM Jon Moses wrote: Reason for CRM: Pt called in wanting to know how to get his B12 levels back up. Pt stated that a nurse told him about a month ago that his levels were low. Pt would like a callback for this information.

## 2024-09-01 ENCOUNTER — Ambulatory Visit: Admitting: Family Medicine

## 2024-09-02 ENCOUNTER — Encounter: Payer: Self-pay | Admitting: Adult Health

## 2024-09-02 ENCOUNTER — Ambulatory Visit (INDEPENDENT_AMBULATORY_CARE_PROVIDER_SITE_OTHER): Admitting: Adult Health

## 2024-09-02 VITALS — BP 102/62 | HR 80 | Temp 98.4°F | Ht 69.0 in | Wt 184.0 lb

## 2024-09-02 DIAGNOSIS — R42 Dizziness and giddiness: Secondary | ICD-10-CM

## 2024-09-02 DIAGNOSIS — I952 Hypotension due to drugs: Secondary | ICD-10-CM | POA: Diagnosis not present

## 2024-09-02 MED ORDER — AMLODIPINE BESYLATE 5 MG PO TABS: 5.0000 mg | ORAL_TABLET | Freq: Every day | ORAL | 0 refills | Status: AC

## 2024-09-02 NOTE — Progress Notes (Signed)
 Subjective:    Patient ID: Jon Moses, male    DOB: December 14, 1950, 73 y.o.   MRN: [id number]  Dizziness   73 year old male who  has a past medical history of Diabetes mellitus without complication (HCC), Hyperlipidemia (03/26/2011), and HYPERTENSION (07/17/2010).  He presents to the office today for concern for hypotension. He reports that over the last few weeks he has been experiencing a feeling of lightheadedness. His symptoms do not get worse with change in positions. He denies syncope or dizziness. He has been checking his BP at home since his symptoms started and reports that his blood pressures have been below 110/70. He is currently managed with Norvasc  10 mg daily, Benicar  40-12.5 daily , Hydralazine  25 mg daily and Spirolactone 25 mg daily   He denies any other symptoms.    Review of Systems  Neurological:  Positive for dizziness.   See HPI   Past Medical History:  Diagnosis Date   Diabetes mellitus without complication (HCC)    Hyperlipidemia 03/26/2011   HYPERTENSION 07/17/2010    Social History   Socioeconomic History   Marital status: Married    Spouse name: Not on file   Number of children: Not on file   Years of education: Not on file   Highest education level: Bachelor's degree (e.g., BA, AB, BS)  Occupational History   Not on file  Tobacco Use   Smoking status: Never   Smokeless tobacco: Never  Vaping Use   Vaping status: Never Used  Substance and Sexual Activity   Alcohol use: Yes    Comment: occ   Drug use: No   Sexual activity: Not on file  Other Topics Concern   Not on file  Social History Narrative   Not on file   Social Drivers of Health   Financial Resource Strain: Low Risk  (11/17/2023)   Overall Financial Resource Strain (CARDIA)    Difficulty of Paying Living Expenses: Not hard at all  Food Insecurity: No Food Insecurity (11/17/2023)   Hunger Vital Sign    Worried About Running Out of Food in the Last Year: Never true    Ran Out of  Food in the Last Year: Never true  Transportation Needs: No Transportation Needs (11/17/2023)   PRAPARE - Administrator, Civil Service (Medical): No    Lack of Transportation (Non-Medical): No  Physical Activity: Sufficiently Active (11/17/2023)   Exercise Vital Sign    Days of Exercise per Week: 5 days    Minutes of Exercise per Session: 100 min  Recent Concern: Physical Activity - Insufficiently Active (09/29/2023)   Exercise Vital Sign    Days of Exercise per Week: 2 days    Minutes of Exercise per Session: 20 min  Stress: No Stress Concern Present (11/17/2023)   [employer] of Occupational Health - Occupational Stress Questionnaire    Feeling of Stress : Not at all  Social Connections: Socially Integrated (11/17/2023)   Social Connection and Isolation Panel    Frequency of Communication with Friends and Family: More than three times a week    Frequency of Social Gatherings with Friends and Family: More than three times a week    Attends Religious Services: More than 4 times per year    Active Member of [employer] or Organizations: Yes    Attends Banker Meetings: More than 4 times per year    Marital Status: Married  Catering manager Violence: Not At Risk (11/17/2023)   Humiliation,  Afraid, Rape, and Kick questionnaire    Fear of Current or Ex-Partner: No    Emotionally Abused: No    Physically Abused: No    Sexually Abused: No    Past Surgical History:  Procedure Laterality Date   COLONOSCOPY     11 yrs ago from 2021   INNER EAR SURGERY  1975   left   LIPOMA EXCISION Left 12/24/2013   Procedure: EXCISION MASS LEFT BACK;  Surgeon: Dann FORBES Hummer, MD;  Location: [facility];  Service: General;  Laterality: Left;   TONSILLECTOMY      Family History  Problem Relation Age of Onset   Hypertension Other    Hypertension Mother    Stroke Father    Colon cancer Neg Hx    Colon polyps Neg Hx    Esophageal cancer Neg Hx    Stomach  cancer Neg Hx    Rectal cancer Neg Hx     Allergies  Allergen Reactions   Ace Inhibitors Cough    Current Outpatient Medications on File Prior to Visit  Medication Sig Dispense Refill   aspirin EC 81 MG tablet Take 81 mg by mouth daily. Swallow whole.     atorvastatin  (LIPITOR) 40 MG tablet TAKE 1 TABLET BY MOUTH EVERY DAY 90 tablet 1   hydrALAZINE  (APRESOLINE ) 25 MG tablet TAKE 1 TABLET BY MOUTH TWICE A DAY 180 tablet 0   metFORMIN  (GLUCOPHAGE ) 500 MG tablet TAKE 1 TABLET BY MOUTH TWICE A DAY 180 tablet 1   olmesartan -hydrochlorothiazide  (BENICAR  HCT) 40-12.5 MG tablet TAKE 1 TABLET BY MOUTH EVERY DAY 90 tablet 1   spironolactone  (ALDACTONE ) 25 MG tablet TAKE 1 TABLET (25 MG TOTAL) BY MOUTH DAILY. 90 tablet 1   No current facility-administered medications on file prior to visit.    BP 102/62   Pulse 80   Temp 98.4 F (36.9 C) (Oral)   Ht 5' 9 (1.753 m)   Wt 184 lb (83.5 kg)   SpO2 98%   BMI 27.17 kg/m       Objective:   Physical Exam Vitals and nursing note reviewed.  Constitutional:      Appearance: Normal appearance.  Cardiovascular:     Rate and Rhythm: Normal rate and regular rhythm.     Pulses: Normal pulses.     Heart sounds: Normal heart sounds.  Pulmonary:     Breath sounds: Normal breath sounds.  Musculoskeletal:        General: Normal range of motion.  Skin:    General: Skin is warm and dry.  Neurological:     General: No focal deficit present.     Mental Status: He is alert and oriented to person, place, and time.  Psychiatric:        Mood and Affect: Mood normal.        Behavior: Behavior normal.        Thought Content: Thought content normal.        Judgment: Judgment normal.           Assessment & Plan:  1. Hypotension due to drugs (Primary) - Will decrease his doze of Norvasc  from 10 mg to 5 mg to start off with. Follow up with PCP in 3 weeks. Continue to monitor BP  2. Lightheadedness - Likely due to hypotension   Darleene Shape,  NP  I personally spent a total of 30 minutes in the care of the patient today including preparing to see the patient, getting/reviewing separately obtained  history, performing a medically appropriate exam/evaluation, placing orders, and documenting clinical information in the EHR.

## 2024-09-02 NOTE — Patient Instructions (Signed)
 I am going to decrease your Norvasc  to 5 mg daily   Keep everything else the same   Follow up with Dr. Micheal in 3-4 weeks

## 2024-09-03 ENCOUNTER — Other Ambulatory Visit: Payer: Self-pay | Admitting: Family Medicine

## 2024-09-27 ENCOUNTER — Ambulatory Visit: Admitting: Family Medicine

## 2024-10-01 ENCOUNTER — Ambulatory Visit: Payer: Self-pay

## 2024-10-01 NOTE — Telephone Encounter (Signed)
 FYI Only or Action Required?: FYI only for provider: appointment scheduled on 10/04/24.  Patient was last seen in primary care on 09/02/2024 by Merna Huxley, NP.  Called Nurse Triage reporting Back Pain.  Symptoms began years, worse several weeks ago.  Interventions attempted: OTC medications: tylenol .  Symptoms are: gradually worsening.  Triage Disposition: See PCP When Office is Open (Within 3 Days)  Patient/caregiver understands and will follow disposition?: Yes Reason for Disposition  [1] MODERATE back pain (e.g., interferes with normal activities) AND [2] present > 3 days  Answer Assessment - Initial Assessment Questions Pt reports constant/chronic back pain x years, worsening past 2 weeks. OV scheduled 11/10. Advised to see medical care before then if pain worsens, if he develops any neurological deficits or dysuria.   1. ONSET: When did the pain begin? (e.g., minutes, hours, days)     Worsening x 2 weeks  2. LOCATION: Where does it hurt? (upper, mid or lower back)     Lower back  3. SEVERITY: How bad is the pain?  (e.g., Scale 1-10; mild, moderate, or severe)     6-7/10  4. PATTERN: Is the pain constant? (e.g., yes, no; constant, intermittent)      Constant  5. RADIATION: Does the pain shoot into your legs or somewhere else?     Denies  6. CAUSE:  What do you think is causing the back pain?      Unknown  Protocols used: Back Pain-A-AH Copied from CRM [id number]. Topic: Clinical - Red Word Triage >> Oct 01, 2024  4:08 PM Aisha D wrote: Red Word that prompted transfer to Nurse Triage: pain   Pt stated that he is experiencing back pain and would like to schedule an appt with his provider.    ----------------------------------------------------------------------- From previous Reason for Contact - Scheduling: Patient/patient representative is calling to schedule an appointment. Refer to attachments for appointment information.

## 2024-10-04 ENCOUNTER — Ambulatory Visit: Admitting: Physician Assistant

## 2024-10-04 ENCOUNTER — Ambulatory Visit: Admitting: Family Medicine

## 2024-10-19 ENCOUNTER — Ambulatory Visit: Admitting: Family Medicine

## 2024-10-19 NOTE — Progress Notes (Addendum)
 Jon Moses                                          MRN: [id number]   10/19/2024   The [facility] Quality Team Specialist reviewed this patient medical record for the purposes of chart review for care gap closure. The following were reviewed: chart review for care gap closure-kidney health evaluation for diabetes:eGFR  and uACR. Appt today!  Patient did not end up having 11/25 appt. Awv scheduled for 12/29  12/08/2024- cannot close ked for 2025    [facility] Quality Team

## 2024-11-19 ENCOUNTER — Ambulatory Visit: Admitting: Family Medicine

## 2024-11-19 ENCOUNTER — Ambulatory Visit: Payer: Self-pay

## 2024-11-19 ENCOUNTER — Telehealth: Payer: Self-pay | Admitting: *Deleted

## 2024-11-19 NOTE — Telephone Encounter (Signed)
 Appointment made at 11:20am today 11/19/2024 with Corean Ku FNP at [facility] due to no openings at PCP office   FYI Only or Action Required?: FYI only for provider: appointment scheduled on 11/19/2024 at 11:20am with Corean Ku FNP at [facility] due to no openings at PCP office .  Patient was last seen in primary care on 09/02/2024 by Merna Huxley, NP.  Called Nurse Triage reporting Ear Drainage.  Symptoms began two days ago.  Interventions attempted: Rest, hydration, or home remedies and Other: sleeping with a cotton ball in this ear for the past two nights.  Symptoms are: unchanged.  Triage Disposition: See Physician Within 24 Hours  Patient/caregiver understands and will follow disposition?: Yes          Copied from CRM #[id number]. Topic: Clinical - Red Word Triage >> Nov 19, 2024  8:17 AM Berwyn MATSU wrote: Red Word that prompted transfer to Nurse Triage:  patient is complaining of drainage from ear states its like puffy almost like infected hx of surgery on that ear and states he needs to be checked.  (Left ear) Reason for Disposition  Cloudy - white discharge from ear canal  Answer Assessment - Initial Assessment Questions Left ear discharge x two days  Patient states he had surgery on his left ear in 1975 and ever since that surgery he has been required to check in with a doctor every year For the past two days--Patient denies any pain or any water getting into the ear, no foreign objects. Patient states discharge is a milky color possibly. Patient denies any falls or injuries or trauma to the face  Sleeping with cotton for the past two nights and seen the discharge on the cotton ball  Appointment made at 11:20am today 11/19/2024 with Corean Ku FNP at [facility] due to no openings at PCP office  Patient is advised to call us  back if anything changes or with any further questions/concerns. Patient is advised that if anything  worsens to go to the Emergency Room. Patient verbalized understanding.  Protocols used: Ear - Discharge-A-AH

## 2024-11-19 NOTE — Telephone Encounter (Signed)
 Copied from CRM #[id number]. Topic: General - Other >> Nov 19, 2024 11:00 AM Amy B wrote: Reason for CRM: Patient is requesting a call from a nurse [phone]

## 2024-11-21 NOTE — Telephone Encounter (Signed)
 Looks like he was given appt at [facility].  Wolm LELON Scarlet MD [facility] at [facility]

## 2024-11-22 ENCOUNTER — Ambulatory Visit: Payer: Medicare HMO

## 2024-11-22 VITALS — BP 138/64 | HR 73 | Temp 98.0°F | Ht 69.0 in | Wt 183.5 lb

## 2024-11-22 DIAGNOSIS — Z0000 Encounter for general adult medical examination without abnormal findings: Secondary | ICD-10-CM

## 2024-11-22 NOTE — Progress Notes (Signed)
 "  Chief Complaint  Patient presents with   Medicare Wellness     Subjective:   Jon Moses is a 73 y.o. male who presents for a Medicare Annual Wellness Visit.  Visit info / Clinical Intake: Medicare Wellness Visit Type:: Subsequent Annual Wellness Visit Persons participating in visit and providing information:: patient Medicare Wellness Visit Mode:: In-person (required for WTM) Pre-visit prep was completed: yes AWV questionnaire completed by patient prior to visit?: no Living arrangements:: lives with spouse/significant other Patient's Overall Health Status Rating: excellent Typical amount of pain: none Does pain affect daily life?: no Are you currently prescribed opioids?: no  Dietary Habits and Nutritional Risks How many meals a day?: 3 Eats fruit and vegetables daily?: yes Most meals are obtained by: preparing own meals In the last 2 weeks, have you had any of the following?: none Diabetic:: no  Functional Status Activities of Daily Living (to include ambulation/medication): Independent Ambulation: Independent with device- listed below Home Assistive Devices/Equipment: Eyeglasses Medication Administration: Independent Home Management (perform basic housework or laundry): Independent Manage your own finances?: yes Primary transportation is: driving Concerns about vision?: no *vision screening is required for WTM* Concerns about hearing?: no  Fall Screening Falls in the past year?: 0 Number of falls in past year: 0 Was there an injury with Fall?: 0 Fall Risk Category Calculator: 0 Patient Fall Risk Level: Low Fall Risk  Fall Risk Patient at Risk for Falls Due to: No Fall Risks Fall risk Follow up: Falls evaluation completed  Home and Transportation Safety: All rugs have non-skid backing?: yes All stairs or steps have railings?: yes Grab bars in the bathtub or shower?: (!) no Have non-skid surface in bathtub or shower?: yes Good home lighting?: yes Regular  seat belt use?: yes Hospital stays in the last year:: no  Cognitive Assessment Difficulty concentrating, remembering, or making decisions? : no Will 6CIT or Mini Cog be Completed: yes What year is it?: 0 points What month is it?: 0 points Give patient an address phrase to remember (5 components): [address] [place]  About what time is it?: 0 points Count backwards from 20 to 1: 0 points Say the months of the year in reverse: 0 points Repeat the address phrase from earlier: 0 points 6 CIT Score: 0 points  Advance Directives (For Healthcare) Does Patient Have a Medical Advance Directive?: No Would patient like information on creating a medical advance directive?: No - Patient declined  Reviewed/Updated  Reviewed/Updated: Reviewed All (Medical, Surgical, Family, Medications, Allergies, Care Teams, Patient Goals)    Allergies (verified) Ace inhibitors   Current Medications (verified) Outpatient Encounter Medications as of 11/22/2024  Medication Sig   amLODipine  (NORVASC ) 5 MG tablet Take 1 tablet (5 mg total) by mouth daily.   aspirin EC 81 MG tablet Take 81 mg by mouth daily. Swallow whole.   atorvastatin  (LIPITOR) 40 MG tablet TAKE 1 TABLET BY MOUTH EVERY DAY   hydrALAZINE  (APRESOLINE ) 25 MG tablet TAKE 1 TABLET BY MOUTH TWICE A DAY   metFORMIN  (GLUCOPHAGE ) 500 MG tablet TAKE 1 TABLET BY MOUTH TWICE A DAY   olmesartan -hydrochlorothiazide  (BENICAR  HCT) 40-12.5 MG tablet TAKE 1 TABLET BY MOUTH EVERY DAY   spironolactone  (ALDACTONE ) 25 MG tablet TAKE 1 TABLET (25 MG TOTAL) BY MOUTH DAILY.   No facility-administered encounter medications on file as of 11/22/2024.    History: Past Medical History:  Diagnosis Date   Diabetes mellitus without complication (HCC)    Hyperlipidemia 03/26/2011   HYPERTENSION 07/17/2010  Past Surgical History:  Procedure Laterality Date   COLONOSCOPY     11 yrs ago from 2021   INNER EAR SURGERY  1975   left   LIPOMA EXCISION Left  12/24/2013   Procedure: EXCISION MASS LEFT BACK;  Surgeon: Dann FORBES Hummer, MD;  Location: [facility];  Service: General;  Laterality: Left;   TONSILLECTOMY     Family History  Problem Relation Age of Onset   Hypertension Other    Hypertension Mother    Stroke Father    Colon cancer Neg Hx    Colon polyps Neg Hx    Esophageal cancer Neg Hx    Stomach cancer Neg Hx    Rectal cancer Neg Hx    Social History   Occupational History   Not on file  Tobacco Use   Smoking status: Never   Smokeless tobacco: Never  Vaping Use   Vaping status: Never Used  Substance and Sexual Activity   Alcohol use: Yes    Comment: occ   Drug use: No   Sexual activity: Not on file   Tobacco Counseling Counseling given: No  SDOH Screenings   Food Insecurity: No Food Insecurity (11/22/2024)  Housing: Unknown (11/22/2024)  Transportation Needs: No Transportation Needs (11/22/2024)  Utilities: Not At Risk (11/22/2024)  Alcohol Screen: Low Risk (11/17/2023)  Depression (PHQ2-9): Low Risk (11/22/2024)  Financial Resource Strain: Low Risk (11/17/2023)  Physical Activity: Sufficiently Active (11/22/2024)  Social Connections: Socially Integrated (11/22/2024)  Stress: No Stress Concern Present (11/22/2024)  Tobacco Use: Low Risk (11/22/2024)  Health Literacy: Adequate Health Literacy (11/22/2024)   See flowsheets for full screening details  Depression Screen PHQ 2 & 9 Depression Scale- Over the past 2 weeks, how often have you been bothered by any of the following problems? Little interest or pleasure in doing things: 0 Feeling down, depressed, or hopeless (PHQ Adolescent also includes...irritable): 0 PHQ-2 Total Score: 0     Goals Addressed               This Visit's Progress     Increase physical activity (pt-stated)        Remain active             Objective:    Today's Vitals   11/22/24 1553  BP: 138/64  Pulse: 73  Temp: 98 F (36.7 C)  TempSrc: Oral   SpO2: 96%  Weight: 183 lb 8 oz (83.2 kg)  Height: 5' 9 (1.753 m)   Body mass index is 27.1 kg/m.  Hearing/Vision screen Hearing Screening - Comments:: Denies hearing difficulties   Vision Screening - Comments:: Wears rx glasses - Not up to date with routine eye exams Deferred   Immunizations and Health Maintenance Health Maintenance  Topic Date Due   FOOT EXAM  Never done   Diabetic kidney evaluation - Urine ACR  Never done   Zoster Vaccines- Shingrix (1 of 2) Never done   COVID-19 Vaccine (3 - Moderna risk series) 03/13/2020   OPHTHALMOLOGY EXAM  06/05/2022   Influenza Vaccine  06/25/2024   HEMOGLOBIN A1C  12/09/2024   Diabetic kidney evaluation - eGFR measurement  06/08/2025   Medicare Annual Wellness (AWV)  11/22/2025   DTaP/Tdap/Td (3 - Td or Tdap) 02/06/2026   Colonoscopy  01/10/2027   Pneumococcal Vaccine: 50+ Years  Completed   Hepatitis C Screening  Completed   Meningococcal B Vaccine  Aged Out        Assessment/Plan:  This is a routine wellness examination  for [place].  Patient Care Team: Micheal Wolm ORN, MD as PCP - General Dunn, [facility])  I have personally reviewed and noted the following in the patients chart:   Medical and social history Use of alcohol, tobacco or illicit drugs  Current medications and supplements including opioid prescriptions. Functional ability and status Nutritional status Physical activity Advanced directives List of other physicians Hospitalizations, surgeries, and ER visits in previous 12 months Vitals Screenings to include cognitive, depression, and falls Referrals and appointments  No orders of the defined types were placed in this encounter.  In addition, I have reviewed and discussed with patient certain preventive protocols, quality metrics, and best practice recommendations. A written personalized care plan for preventive services as well as general preventive health recommendations were provided to  patient.   Rojelio ORN Blush, LPN   [id number]/[id number]   Return in 53 weeks (on 11/28/2025).  After Visit Summary: (In Person-Printed) AVS printed and given to the patient  Nurse Notes: HM Addressed: Patient deferred Ophthalmology Exam and Vaccines "

## 2024-11-22 NOTE — Patient Instructions (Addendum)
 Mr. Condrey,  Thank you for taking the time for your Medicare Wellness Visit. I appreciate your continued commitment to your health goals. Please review the care plan we discussed, and feel free to reach out if I can assist you further.  Please note that Annual Wellness Visits do not include a physical exam. Some assessments may be limited, especially if the visit was conducted virtually. If needed, we may recommend an in-person follow-up with your provider.  Ongoing Care Seeing your primary care provider every 3 to 6 months helps us  monitor your health and provide consistent, personalized care.    Referrals If a referral was made during today's visit and you haven't received any updates within two weeks, please contact the referred provider directly to check on the status.  Recommended Screenings:  Health Maintenance  Topic Date Due   Complete foot exam   Never done   Yearly kidney health urinalysis for diabetes  Never done   Zoster (Shingles) Vaccine (1 of 2) Never done   COVID-19 Vaccine (3 - Moderna risk series) 03/13/2020   Eye exam for diabetics  06/05/2022   Flu Shot  06/25/2024   Hemoglobin A1C  12/09/2024   Yearly kidney function blood test for diabetes  06/08/2025   Medicare Annual Wellness Visit  11/22/2025   DTaP/Tdap/Td vaccine (3 - Td or Tdap) 02/06/2026   Colon Cancer Screening  01/10/2027   Pneumococcal Vaccine for age over 6  Completed   Hepatitis C Screening  Completed   Meningitis B Vaccine  Aged Out       11/22/2024    4:03 PM  Advanced Directives  Does Patient Have a Medical Advance Directive? No  Would patient like information on creating a medical advance directive? No - Patient declined    Vision: Annual vision screenings are recommended for early detection of glaucoma, cataracts, and diabetic retinopathy. These exams can also reveal signs of chronic conditions such as diabetes and high blood pressure.  Dental: Annual dental screenings help detect  early signs of oral cancer, gum disease, and other conditions linked to overall health, including heart disease and diabetes.  Please see the attached documents for additional preventive care recommendations.

## 2024-11-26 ENCOUNTER — Telehealth: Payer: Self-pay | Admitting: *Deleted

## 2024-11-26 ENCOUNTER — Ambulatory Visit: Admitting: Family Medicine

## 2024-11-26 NOTE — Telephone Encounter (Signed)
 Copied from CRM [id number]. Topic: Clinical - Medication Question >> Nov 26, 2024  2:57 PM Jon Moses wrote: Reason for CRM: Pt stated that he was recently seen for a cough and stated that the cough hasn't improved. Pt wants to know if he can be prescribed a medication for the cough or if there is anything that he recommends taking . Pt would like a callback with an update.

## 2024-11-26 NOTE — Telephone Encounter (Signed)
 ATC but could not reach patient at this time

## 2024-11-27 ENCOUNTER — Other Ambulatory Visit: Payer: Self-pay | Admitting: Family Medicine

## 2024-11-29 ENCOUNTER — Ambulatory Visit (INDEPENDENT_AMBULATORY_CARE_PROVIDER_SITE_OTHER): Admitting: Internal Medicine

## 2024-11-29 ENCOUNTER — Telehealth: Payer: Self-pay | Admitting: *Deleted

## 2024-11-29 ENCOUNTER — Telehealth: Payer: Self-pay

## 2024-11-29 ENCOUNTER — Encounter: Payer: Self-pay | Admitting: Internal Medicine

## 2024-11-29 VITALS — BP 117/70 | HR 90 | Temp 98.1°F | Wt 188.9 lb

## 2024-11-29 DIAGNOSIS — J069 Acute upper respiratory infection, unspecified: Secondary | ICD-10-CM

## 2024-11-29 NOTE — Telephone Encounter (Signed)
 ATC but could not reach patient at this time

## 2024-11-29 NOTE — Telephone Encounter (Signed)
 Answered in phone message 11/29/2024.

## 2024-11-29 NOTE — Telephone Encounter (Signed)
 Tried to call the patient, but unable to leave a message.

## 2024-11-29 NOTE — Telephone Encounter (Signed)
 Pt said he had a no touch exam today and per pt md did not look in his left ear and he had per pt drainage in left ear . Pt had ear surgery back in 1975 to removed tumor. Pt seems confuse about today appt. Pt would like mykal to return his call

## 2024-11-29 NOTE — Telephone Encounter (Signed)
 Copied from CRM [id number]. Topic: General - Other >> Nov 29, 2024 12:10 PM Aisha D wrote: Reason for CRM: Pt stated that he has some questions in regards to his appt today with Dr.Hernandez. Pt is requesting to speak with Dr.Hernandez or her nurse and would like a callback today.

## 2024-11-29 NOTE — Telephone Encounter (Signed)
 Copied from CRM #[id number]. Topic: General - Other >> Nov 29, 2024  2:09 PM Thersia BROCKS wrote: Reason for CRM: Patient called in wantign to speak with Dr.Burchette, would like for him to give him a callback today

## 2024-11-29 NOTE — Telephone Encounter (Signed)
 Patient seen today

## 2024-11-29 NOTE — Progress Notes (Signed)
 "    Established Patient Office Visit     CC/Reason for Visit: URI symptoms  HPI: Jon Moses is a 74 y.o. male who is coming in today for the above mentioned reasons.  For the past week he has been having nasal congestion, rhinorrhea, sinus pain and pressure and left ear drainage.  He has the absence of a left eardrum after a cholesteatoma surgery.  No fever no aches.   Past Medical/Surgical History: Past Medical History:  Diagnosis Date   Diabetes mellitus without complication (HCC)    Hyperlipidemia 03/26/2011   HYPERTENSION 07/17/2010    Past Surgical History:  Procedure Laterality Date   COLONOSCOPY     11 yrs ago from 2021   INNER EAR SURGERY  1975   left   LIPOMA EXCISION Left 12/24/2013   Procedure: EXCISION MASS LEFT BACK;  Surgeon: Dann FORBES Hummer, MD;  Location: [facility];  Service: General;  Laterality: Left;   TONSILLECTOMY      Social History:  reports that he has never smoked. He has never used smokeless tobacco. He reports current alcohol use. He reports that he does not use drugs.  Allergies: Allergies[1]  Family History:  Family History  Problem Relation Age of Onset   Hypertension Other    Hypertension Mother    Stroke Father    Colon cancer Neg Hx    Colon polyps Neg Hx    Esophageal cancer Neg Hx    Stomach cancer Neg Hx    Rectal cancer Neg Hx     Current Medications[2]  Review of Systems:  Negative unless indicated in HPI.   Physical Exam: Vitals:   11/29/24 1125 11/29/24 1127  BP: (!) 150/90 117/70  Pulse: 90   Temp: 98.1 F (36.7 C)   TempSrc: Oral   SpO2: 98%   Weight: 188 lb 14.4 oz (85.7 kg)     Body mass index is 27.9 kg/m.   Physical Exam Vitals reviewed.  Constitutional:      Appearance: Normal appearance.  HENT:     Right Ear: Ear canal and external ear normal. A middle ear effusion is present.     Left Ear: Ear canal and external ear normal. Drainage present. Tympanic membrane is  perforated.     Mouth/Throat:     Mouth: Mucous membranes are moist.     Pharynx: Oropharynx is clear.  Eyes:     Conjunctiva/sclera: Conjunctivae normal.     Pupils: Pupils are equal, round, and reactive to light.  Cardiovascular:     Rate and Rhythm: Normal rate and regular rhythm.  Pulmonary:     Effort: Pulmonary effort is normal.     Breath sounds: Normal breath sounds.  Neurological:     Mental Status: He is alert.      Impression and Plan:  Upper respiratory tract infection, unspecified type  -Given exam findings, PNA, pharyngitis, ear infection are not likely, hence abx have not been prescribed. -Have advised rest, fluids, OTC antihistamines, cough suppressants and mucinex. -RTC if no improvement in 10-14 days. - He does not have a left eardrum due to a cholesteatoma show it naturally drains when he has a URI.   Time spent:22 minutes reviewing chart, interviewing and examining patient and formulating plan of care.     Tully Theophilus Andrews, MD [facility]     [1]  Allergies Allergen Reactions   Ace Inhibitors Cough  [2]  Current Outpatient Medications:  amLODipine  (NORVASC ) 5 MG tablet, Take 1 tablet (5 mg total) by mouth daily., Disp: 90 tablet, Rfl: 0   aspirin EC 81 MG tablet, Take 81 mg by mouth daily. Swallow whole., Disp: , Rfl:    atorvastatin  (LIPITOR) 40 MG tablet, TAKE 1 TABLET BY MOUTH EVERY DAY, Disp: 90 tablet, Rfl: 1   hydrALAZINE  (APRESOLINE ) 25 MG tablet, TAKE 1 TABLET BY MOUTH TWICE A DAY, Disp: 60 tablet, Rfl: 0   metFORMIN  (GLUCOPHAGE ) 500 MG tablet, TAKE 1 TABLET BY MOUTH TWICE A DAY, Disp: 180 tablet, Rfl: 1   olmesartan -hydrochlorothiazide  (BENICAR  HCT) 40-12.5 MG tablet, TAKE 1 TABLET BY MOUTH EVERY DAY, Disp: 90 tablet, Rfl: 1   spironolactone  (ALDACTONE ) 25 MG tablet, TAKE 1 TABLET (25 MG TOTAL) BY MOUTH DAILY., Disp: 90 tablet, Rfl: 1  "

## 2024-11-30 ENCOUNTER — Telehealth: Payer: Self-pay

## 2024-11-30 NOTE — Telephone Encounter (Signed)
 Copied from CRM #[id number]. Topic: General - Other >> Nov 29, 2024  2:09 PM Thersia BROCKS wrote: Reason for CRM: Patient called in wantign to speak with Dr.Burchette, would like for him to give him a callback today >> Nov 29, 2024  4:26 PM Drema MATSU wrote: Patient is calling to inquire about request. He is wanting answers and is requesting a callback. He stated that he specifically asked for Dr. Micheal and wants to know why he saw Dr. Theophilus instead. Please call pt.

## 2024-11-30 NOTE — Telephone Encounter (Signed)
 ATC but could not reach patient at this time

## 2024-11-30 NOTE — Telephone Encounter (Signed)
2nd attempt to call the patient Unable to leave a message. 

## 2024-12-01 NOTE — Telephone Encounter (Signed)
 3rd attempt to reach the patient.  Unable to leave a message.

## 2024-12-06 ENCOUNTER — Telehealth: Payer: Self-pay | Admitting: Family Medicine

## 2024-12-06 ENCOUNTER — Ambulatory Visit: Payer: Self-pay

## 2024-12-06 DIAGNOSIS — H9210 Otorrhea, unspecified ear: Secondary | ICD-10-CM

## 2024-12-06 NOTE — Telephone Encounter (Signed)
 FYI Only or Action Required?: Action required by provider: referral request.  Patient was last seen in primary care on 11/29/2024 by Theophilus Andrews, Tully GRADE, MD.  Called Nurse Triage reporting Ear Drainage.   Triage Disposition: Call PCP Within 24 Hours  Patient/caregiver understands and will follow disposition?: Yes   Message from Amber H sent at 12/06/2024  3:28 PM EST  Reason for Triage: Patient c/o left ear drainage. Denied pain or any other symps. Stated was seen last week for the issue but provider did not check his ears.  Ezra- [phone]   Reason for Disposition  [1] Follow-up call from patient regarding patient's clinical status AND [2] information NON-URGENT  Answer Assessment - Initial Assessment Questions 1. REASON FOR CALL or QUESTION: What is your reason for calling today? or How can I best     Returned call to f/u on symptoms. Pt states L ear is draining, minimal amount throughout night. Notices dried drainage on his pillow; unsure of color. Pt states it is not all the time, no pain, no fever. Hx of surgery to L ear several years ago. Pt would like referral to ENT.  Protocols used: PCP Call - No Triage-A-AH

## 2024-12-06 NOTE — Telephone Encounter (Signed)
 Copied from CRM #[id number]. Topic: Referral - Request for Referral >> Dec 06, 2024  3:22 PM Jeoffrey H wrote: Did the patient discuss referral with their provider in the last year? Yes (If No - schedule appointment) (If Yes - send message)  Appointment offered? No  Type of order/referral and detailed reason for visit: ENT Doctor due to Left Ear Drainage  Preference of office, provider, location:   [facility]  [address]  [place], [place] [address]  [phone]   Woodward Tilford Hilding, MD  [address]  [place], [place] [address]  [phone] (Work)  [phone] (Fax)      If referral order, have you been seen by this specialty before? Yes (If Yes, this issue or another issue? When? Where?  Can we respond through MyChart? No

## 2024-12-06 NOTE — Telephone Encounter (Signed)
Please see additional telephone note.

## 2024-12-07 NOTE — Telephone Encounter (Signed)
 ATC patient but unable to leave message at this time

## 2024-12-07 NOTE — Telephone Encounter (Signed)
 Referral done

## 2024-12-28 ENCOUNTER — Other Ambulatory Visit: Payer: Self-pay | Admitting: Family Medicine

## 2024-12-28 DIAGNOSIS — I10 Essential (primary) hypertension: Secondary | ICD-10-CM

## 2024-12-30 ENCOUNTER — Encounter: Payer: Self-pay | Admitting: *Deleted

## 2024-12-30 NOTE — Progress Notes (Signed)
 Jon Moses                                          MRN: [id number]   12/30/2024   The [facility] Quality Team Specialist reviewed this patient medical record for the purposes of chart review for care gap closure. The following were reviewed: chart review for care gap closure-diabetic eye exam.    [facility] Quality Team

## 2025-11-28 ENCOUNTER — Ambulatory Visit
